# Patient Record
Sex: Female | Born: 1945
Health system: Southern US, Community
[De-identification: ages and names within clinical notes are randomized; demographics above are authoritative.]

## PROBLEM LIST (undated history)

## (undated) DIAGNOSIS — F419 Anxiety disorder, unspecified: Secondary | ICD-10-CM

## (undated) DIAGNOSIS — I824Z9 Acute embolism and thrombosis of unspecified deep veins of unspecified distal lower extremity: Secondary | ICD-10-CM

## (undated) DIAGNOSIS — Z9889 Other specified postprocedural states: Secondary | ICD-10-CM

## (undated) DIAGNOSIS — Z9289 Personal history of other medical treatment: Secondary | ICD-10-CM

## (undated) DIAGNOSIS — F329 Major depressive disorder, single episode, unspecified: Secondary | ICD-10-CM

## (undated) DIAGNOSIS — M8580 Other specified disorders of bone density and structure, unspecified site: Secondary | ICD-10-CM

## (undated) DIAGNOSIS — F32A Depression, unspecified: Secondary | ICD-10-CM

## (undated) DIAGNOSIS — Z7189 Other specified counseling: Secondary | ICD-10-CM

## (undated) DIAGNOSIS — R112 Nausea with vomiting, unspecified: Secondary | ICD-10-CM

## (undated) DIAGNOSIS — M1990 Unspecified osteoarthritis, unspecified site: Secondary | ICD-10-CM

## (undated) DIAGNOSIS — I10 Essential (primary) hypertension: Secondary | ICD-10-CM

## (undated) DIAGNOSIS — K5790 Diverticulosis of intestine, part unspecified, without perforation or abscess without bleeding: Secondary | ICD-10-CM

## (undated) DIAGNOSIS — D126 Benign neoplasm of colon, unspecified: Secondary | ICD-10-CM

## (undated) DIAGNOSIS — D591 Other autoimmune hemolytic anemias: Principal | ICD-10-CM

## (undated) HISTORY — PX: ABDOMINAL HYSTERECTOMY: SHX81

## (undated) HISTORY — DX: Depression, unspecified: F32.A

## (undated) HISTORY — DX: Major depressive disorder, single episode, unspecified: F32.9

## (undated) HISTORY — DX: Diverticulosis of intestine, part unspecified, without perforation or abscess without bleeding: K57.90

## (undated) HISTORY — DX: Other autoimmune hemolytic anemias: D59.1

## (undated) HISTORY — DX: Benign neoplasm of colon, unspecified: D12.6

## (undated) HISTORY — PX: KNEE ARTHROSCOPY: SHX127

## (undated) HISTORY — DX: Other specified disorders of bone density and structure, unspecified site: M85.80

## (undated) HISTORY — DX: Unspecified osteoarthritis, unspecified site: M19.90

## (undated) HISTORY — DX: Essential (primary) hypertension: I10

## (undated) HISTORY — DX: Acute embolism and thrombosis of unspecified deep veins of unspecified distal lower extremity: I82.4Z9

## (undated) HISTORY — DX: Anxiety disorder, unspecified: F41.9

---
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
## 1999-03-18 ENCOUNTER — Other Ambulatory Visit: Admission: RE | Admit: 1999-03-18 | Discharge: 1999-03-18 | Payer: Self-pay | Admitting: *Deleted

## 2000-10-04 ENCOUNTER — Other Ambulatory Visit: Admission: RE | Admit: 2000-10-04 | Discharge: 2000-10-04 | Payer: Self-pay | Admitting: *Deleted

## 2000-12-12 DIAGNOSIS — I824Z9 Acute embolism and thrombosis of unspecified deep veins of unspecified distal lower extremity: Secondary | ICD-10-CM

## 2000-12-12 HISTORY — DX: Acute embolism and thrombosis of unspecified deep veins of unspecified distal lower extremity: I82.4Z9

## 2001-10-02 ENCOUNTER — Other Ambulatory Visit: Admission: RE | Admit: 2001-10-02 | Discharge: 2001-10-02 | Payer: Self-pay | Admitting: *Deleted

## 2002-10-02 ENCOUNTER — Other Ambulatory Visit: Admission: RE | Admit: 2002-10-02 | Discharge: 2002-10-02 | Payer: Self-pay | Admitting: *Deleted

## 2004-02-09 ENCOUNTER — Other Ambulatory Visit: Admission: RE | Admit: 2004-02-09 | Discharge: 2004-02-09 | Payer: Self-pay | Admitting: *Deleted

## 2004-12-12 HISTORY — PX: REPLACEMENT TOTAL KNEE: SUR1224

## 2005-02-01 ENCOUNTER — Other Ambulatory Visit: Admission: RE | Admit: 2005-02-01 | Discharge: 2005-02-01 | Payer: Self-pay | Admitting: *Deleted

## 2005-06-30 ENCOUNTER — Ambulatory Visit: Payer: Self-pay | Admitting: Internal Medicine

## 2006-03-06 ENCOUNTER — Other Ambulatory Visit: Admission: RE | Admit: 2006-03-06 | Discharge: 2006-03-06 | Payer: Self-pay | Admitting: *Deleted

## 2006-07-11 ENCOUNTER — Ambulatory Visit: Payer: Self-pay | Admitting: Internal Medicine

## 2007-01-29 ENCOUNTER — Ambulatory Visit: Payer: Self-pay

## 2007-10-16 ENCOUNTER — Other Ambulatory Visit: Admission: RE | Admit: 2007-10-16 | Discharge: 2007-10-16 | Payer: Self-pay | Admitting: *Deleted

## 2007-11-22 ENCOUNTER — Ambulatory Visit: Payer: Self-pay | Admitting: Internal Medicine

## 2007-11-22 DIAGNOSIS — M2550 Pain in unspecified joint: Secondary | ICD-10-CM | POA: Insufficient documentation

## 2007-11-22 DIAGNOSIS — I10 Essential (primary) hypertension: Secondary | ICD-10-CM | POA: Insufficient documentation

## 2007-11-28 ENCOUNTER — Ambulatory Visit: Payer: Self-pay | Admitting: Internal Medicine

## 2007-11-28 ENCOUNTER — Inpatient Hospital Stay (HOSPITAL_COMMUNITY): Admission: RE | Admit: 2007-11-28 | Discharge: 2007-12-01 | Payer: Self-pay | Admitting: Orthopedic Surgery

## 2007-11-29 ENCOUNTER — Telehealth: Payer: Self-pay | Admitting: Internal Medicine

## 2007-12-13 DIAGNOSIS — Z9289 Personal history of other medical treatment: Secondary | ICD-10-CM

## 2007-12-13 HISTORY — DX: Personal history of other medical treatment: Z92.89

## 2008-12-23 DIAGNOSIS — D649 Anemia, unspecified: Secondary | ICD-10-CM | POA: Insufficient documentation

## 2008-12-23 DIAGNOSIS — F341 Dysthymic disorder: Secondary | ICD-10-CM | POA: Insufficient documentation

## 2008-12-24 ENCOUNTER — Ambulatory Visit: Payer: Self-pay | Admitting: Internal Medicine

## 2009-12-12 DIAGNOSIS — D126 Benign neoplasm of colon, unspecified: Secondary | ICD-10-CM

## 2009-12-12 HISTORY — PX: OTHER SURGICAL HISTORY: SHX169

## 2009-12-12 HISTORY — DX: Benign neoplasm of colon, unspecified: D12.6

## 2010-02-04 ENCOUNTER — Telehealth: Payer: Self-pay | Admitting: Internal Medicine

## 2010-05-06 ENCOUNTER — Telehealth: Payer: Self-pay | Admitting: Internal Medicine

## 2010-10-18 ENCOUNTER — Encounter (INDEPENDENT_AMBULATORY_CARE_PROVIDER_SITE_OTHER): Payer: Self-pay | Admitting: *Deleted

## 2010-11-02 ENCOUNTER — Encounter (INDEPENDENT_AMBULATORY_CARE_PROVIDER_SITE_OTHER): Payer: Self-pay | Admitting: *Deleted

## 2010-11-03 ENCOUNTER — Ambulatory Visit: Payer: Self-pay | Admitting: Internal Medicine

## 2010-11-08 ENCOUNTER — Ambulatory Visit: Payer: Self-pay | Admitting: Internal Medicine

## 2010-11-11 ENCOUNTER — Encounter: Payer: Self-pay | Admitting: Internal Medicine

## 2011-01-11 NOTE — Progress Notes (Signed)
Summary: Switch providers   Phone Note From Other Clinic   Caller: Malachi Bonds @ [facility]  [phone] Call For: Dr. Marina Goodell Summary of Call: pt. would like to switch from Dr. Marina Goodell to Dr. Juanda Chance b/c she prefers a female physician...needs to sch'd a colon Initial call taken by: Karna Christmas,  May 06, 2010 4:37 PM  Follow-up for Phone Call        I Understand. ok w/ me Follow-up by: Hilarie Fredrickson MD,  May 11, 2010 9:25 AM  Additional Follow-up for Phone Call Additional follow up Details #1::        OK.  Additional Follow-up by: Hart Carwin MD,  May 11, 2010 12:49 PM     Appended Document: Switch providers Malachi Bonds at [facility] ntfd. transfer approved.She will contact pt.

## 2011-01-11 NOTE — Procedures (Signed)
Summary: Colonoscopy  Patient: France Lusty Note: All result statuses are Final unless otherwise noted.  Tests: (1) Colonoscopy (COL)   COL Colonoscopy           DONE     [facility]     [address]     [place], [place]  [address]           COLONOSCOPY PROCEDURE REPORT           PATIENT:  Shonice, Wrisley  MR#:  [id number]     BIRTHDATE:  1946/10/27, 64 yrs. old  GENDER:  female     ENDOSCOPIST:  Hedwig Morton. Juanda Chance, MD     REF. BY:  Kari Baars, M.D.     PROCEDURE DATE:  11/08/2010     PROCEDURE:  Colonoscopy 19147     ASA CLASS:  Class I     INDICATIONS:  Routine Risk Screening     MEDICATIONS:   Versed 9 mg, Fentanyl 75 mcg, Benadryl 25 mg           DESCRIPTION OF PROCEDURE:   After the risks benefits and     alternatives of the procedure were thoroughly explained, informed     consent was obtained.  Digital rectal exam was performed and     revealed no rectal masses.   The LB PCF-H180AL [id number] endoscope     was introduced through the anus and advanced to the cecum, which     was identified by both the appendix and ileocecal valve, without     limitations.  The quality of the prep was good, using MoviPrep.     The instrument was then slowly withdrawn as the colon was fully     examined.     <<PROCEDUREIMAGES>>           FINDINGS:  Moderate diverticulosis was found in the sigmoid to     descending colon segments (see image1 and image6).  A diminutive     polyp was found. 2 mm polyp on the IC valve The polyp was removed     using cold biopsy forceps (see image4).  This was otherwise a     normal examination of the colon (see image2, image3, and image7).     Retroflexed views in the rectum revealed no abnormalities.    The     scope was then withdrawn from the patient and the procedure     completed.           COMPLICATIONS:  None     ENDOSCOPIC IMPRESSION:     1) Moderate diverticulosis in the sigmoid to descending colon     segments     2) Diminutive  polyp     3) Otherwise normal examination     RECOMMENDATIONS:     1) Await pathology results     2) High fiber diet.     REPEAT EXAM:  In 10 year(s) for.           ______________________________     Hedwig Morton. Juanda Chance, MD           CC:           n.     eSIGNED:   Hedwig Morton. Brodie at 11/08/2010 02:09 PM           Baus, Bowring, [phone]  Note: An exclamation mark (!) indicates a result that was not dispersed into the flowsheet. Document Creation Date: 11/08/2010 2:09 PM _______________________________________________________________________  (1) Order result  status: Final Collection or observation date-time: 11/08/2010 14:00 Requested date-time:  Receipt date-time:  Reported date-time:  Referring Physician:   Ordering Physician: Lina Sar [phone]) Specimen Source:  Source: Launa Grill Order Number: [id number] Lab site:   Appended Document: Colonoscopy recall colon 5 years  Appended Document: Colonoscopy     Procedures Next Due Date:    Colonoscopy: 11/2015

## 2011-01-11 NOTE — Miscellaneous (Signed)
Summary: DIR COL...AS.  Clinical Lists Changes  Medications: Added new medication of MOVIPREP 100 GM  SOLR (PEG-KCL-NACL-NASULF-NA ASC-C) As per prep instructions. - Signed Rx of MOVIPREP 100 GM  SOLR (PEG-KCL-NACL-NASULF-NA ASC-C) As per prep instructions.;  #1 x 0;  Signed;  Entered by: Clide Cliff RN;  Authorized by: Hart Carwin MD;  Method used: [facility] to [facility] [address] [phone]*, [address], [place], [place], [place]  [address], Ph: [phone] or [phone], Fax: [phone] Observations: Added new observation of ALLERGY REV: Done (11/03/2010 7:56)    Prescriptions: MOVIPREP 100 GM  SOLR (PEG-KCL-NACL-NASULF-NA ASC-C) As per prep instructions.  #1 x 0   Entered by:   Clide Cliff RN   Authorized by:   Hart Carwin MD   Signed by:   Clide Cliff RN on 11/03/2010   Method used:   [facility]        [facility] [address] [phone]* (retail)       [address]       [place], [place]  [address]       Ph: [phone] or [phone]       Fax: [phone]   RxID:   [id number]

## 2011-01-11 NOTE — Letter (Signed)
Summary: [facility] Instructions  [facility]  [address] [place], [place] [address]   Phone: [phone]  Fax: [phone]       Christine [name]    [birth year]    MRN: [id number]        Procedure Day /Date:  11/08/10  Monday     Arrival Time: 12:30pm     Procedure Time:  1:30pm     Location of Procedure:                    _x _  [facility] (4th Floor)                        PREPARATION FOR COLONOSCOPY WITH MOVIPREP   Starting 5 days prior to your procedure Saturday 11/23 do not eat nuts, seeds, popcorn, corn, beans, peas,  salads, or any raw vegetables.  Do not take any fiber supplements (e.g. Metamucil, Citrucel, and Benefiber).  THE DAY BEFORE YOUR PROCEDURE         DATE: Wednesday 11/07/10  1.  Drink clear liquids the entire day-NO SOLID FOOD  2.  Do not drink anything colored red or purple.  Avoid juices with pulp.  No orange juice.  3.  Drink at least 64 oz. (8 glasses) of fluid/clear liquids during the day to prevent dehydration and help the prep work efficiently.  CLEAR LIQUIDS INCLUDE: Water Jello Ice Popsicles Tea (sugar ok, no milk/cream) Powdered fruit flavored drinks Coffee (sugar ok, no milk/cream) Gatorade Juice: apple, white grape, white cranberry  Lemonade Clear bullion, consomm, broth Carbonated beverages (any kind) Strained chicken noodle soup Hard Candy                             4.  In the morning, mix first dose of MoviPrep solution:    Empty 1 Pouch A and 1 Pouch B into the disposable container    Add lukewarm drinking water to the top line of the container. Mix to dissolve    Refrigerate (mixed solution should be used within 24 hrs)  5.  Begin drinking the prep at 5:00 p.m. The MoviPrep container is divided by 4 marks.   Every 15 minutes drink the solution down to the next mark (approximately 8 oz) until the full liter is complete.   6.  Follow completed prep with 16 oz of clear liquid of your choice  (Nothing red or purple).  Continue to drink clear liquids until bedtime.  7.  Before going to bed, mix second dose of MoviPrep solution:    Empty 1 Pouch A and 1 Pouch B into the disposable container    Add lukewarm drinking water to the top line of the container. Mix to dissolve    Refrigerate  THE DAY OF YOUR PROCEDURE      DATE: 11/08/10 Monday  Beginning at 8:30 a.m. (5 hours before procedure):         1. Every 15 minutes, drink the solution down to the next mark (approx 8 oz) until the full liter is complete.  2. Follow completed prep with 16 oz. of clear liquid of your choice.    3. You may drink clear liquids until 11:30am (2 HOURS BEFORE PROCEDURE).   MEDICATION INSTRUCTIONS  Unless otherwise instructed, you should take regular prescription medications with a small sip of water   as early as possible the morning of your procedure.  Additional medication instructions: _HOLD b/p med am of procedure.         OTHER INSTRUCTIONS  You will need a responsible adult at least 66 years of age to accompany you and drive you home.   This person must remain in the waiting room during your procedure.  Wear loose fitting clothing that is easily removed.  Leave jewelry and other valuables at home.  However, you may wish to bring a book to read or  an iPod/MP3 player to listen to music as you wait for your procedure to start.  Remove all body piercing jewelry and leave at home.  Total time from sign-in until discharge is approximately 2-3 hours.  You should go home directly after your procedure and rest.  You can resume normal activities the  day after your procedure.  The day of your procedure you should not:   Drive   Make legal decisions   Operate machinery   Drink alcohol   Return to work  You will receive specific instructions about eating, activities and medications before you leave.    The above instructions have been reviewed and explained to me by    Clide Cliff, RN______________________    I fully understand and can verbalize these instructions _____________________________ Date _________

## 2011-01-11 NOTE — Letter (Signed)
Summary: Pre Visit Letter Revised  [facility]  [address] [place], [place] [address]   Phone: [phone]  Fax: [phone]        10/18/2010 MRN: [id number] [facility] [address]  [place], [place]  [address]             Procedure Date:  11/11/2010   Welcome to the Gastroenterology Division at [facility].    You are scheduled to see a nurse for your pre-procedure visit on 11/03/2010 at 8:00AM on the 3rd floor at [facility], [address]  We ask that you try to arrive at our office 15 minutes prior to your appointment time to allow for check-in.  Please take a minute to review the attached form.  If you answer "Yes" to one or more of the questions on the first page, we ask that you call the person listed at your earliest opportunity.  If you answer "No" to all of the questions, please complete the rest of the form and bring it to your appointment.    Your nurse visit will consist of discussing your medical and surgical history, your immediate family medical history, and your medications.   If you are unable to list all of your medications on the form, please bring the medication bottles to your appointment and we will list them.  We will need to be aware of both prescribed and over the counter drugs.  We will need to know exact dosage information as well.    Please be prepared to read and sign documents such as consent forms, a financial agreement, and acknowledgement forms.  If necessary, and with your consent, a friend or relative is welcome to sit-in on the nurse visit with you.  Please bring your insurance card so that we may make a copy of it.  If your insurance requires a referral to see a specialist, please bring your referral form from your primary care physician.  No co-pay is required for this nurse visit.     If you cannot keep your appointment, please call [phone] to cancel or reschedule prior to your appointment date.   This allows [place] the opportunity to schedule an appointment for another patient in need of care.    Thank you for choosing [facility] for your medical needs.  We appreciate the opportunity to care for you.  Please visit [place] at our website  to learn more about our practice.  Sincerely, The Gastroenterology Division

## 2011-01-11 NOTE — Letter (Signed)
Summary: Patient Notice- Polyp Results  [facility]  [address] [place], [place] [address]   Phone: [phone]  Fax: [phone]        November 11, 2010 MRN: [id number]    [facility] [address] [place], [place]  [address]    Dear [name],  I am pleased to inform you that the colon polyp(s) removed during your recent colonoscopy was (were) found to be benign (no cancer detected) upon pathologic examination.The polyp was adenomatous ( precancerous)  I recommend you have a repeat colonoscopy examination in 5_ years to look for recurrent polyps, as having colon polyps increases your risk for having recurrent polyps or even colon cancer in the future.  Should you develop new or worsening symptoms of abdominal pain, bowel habit changes or bleeding from the rectum or bowels, please schedule an evaluation with either your primary care physician or with me.  Additional information/recommendations:  _x_ No further action with gastroenterology is needed at this time. Please      follow-up with your primary care physician for your other healthcare      needs.  __ Please call [phone] to schedule a return visit to review your      situation.  __ Please keep your follow-up visit as already scheduled.  __ Continue treatment plan as outlined the day of your exam.  Please call us if you are having persistent problems or have questions about your condition that have not been fully answered at this time.  Sincerely,  Hart Carwin MD  This letter has been electronically signed by your physician.  Appended Document: Patient Notice- Polyp Results Letter mailed

## 2011-01-11 NOTE — Progress Notes (Signed)
Summary: Switch from Dr Marina Goodell to female physician   Phone Note From Other Clinic   Caller: [facility] @ Dr Clelia Croft Call For: Dr Marina Goodell Reason for Call: Schedule Patient Appt Summary of Call: Nothing against Dr Marina Goodell -he is wonderful but would feel more comfortable with a femae physician. Would like to switch to Dr Juanda Chance and have a Colonoscopy.  Initial call taken by: Leanor Kail [facility],  February 04, 2010 4:15 PM  Follow-up for Phone Call        ok w/ me. Follow-up by: Hilarie Fredrickson MD,  February 05, 2010 9:27 AM  Additional Follow-up for Phone Call Additional follow up Details #1::        ok with me. DB     Appended Document: Switch from Dr Marina Goodell to female physician   Pt's. last visit with DR.Marina Goodell was in Jan. 2010.Do you want to see her in the office first or is she to be scheduled for direct colon.

## 2011-04-26 NOTE — Op Note (Signed)
Christine [name], Christine [name]              ACCOUNT NO.:  [id number]   MEDICAL RECORD NO.:  [id number]          PATIENT TYPE:  INP   LOCATION:  0007                         FACILITY:  [facility]   PHYSICIAN:  Ollen Gross, M.D.    DATE OF BIRTH:  05-24-1946   DATE OF PROCEDURE:  11/28/2007  DATE OF DISCHARGE:                               OPERATIVE REPORT   PREOPERATIVE DIAGNOSIS:  Osteoarthritis, left knee.   POSTOPERATIVE DIAGNOSIS:  Osteoarthritis, left knee.   PROCEDURE:  Left total knee arthroplasty.   SURGEON:  Ollen Gross, M.D.   ASSISTANT:  Avel Peace PA-C   ANESTHESIA:  General with postop Marcaine pain pump.   ESTIMATED BLOOD LOSS:  Minimal.   DRAINS:  None.   TOURNIQUET TIME:  37 minutes at 300 mmHg.   COMPLICATIONS:  None.   CONDITION:  Stable to recovery.   CLINICAL NOTE:  Christine [name] is a 65 year old female with severe end-stage  osteoarthritis of the left knee with progressively worsening pain and  dysfunction.  She also has a large ganglion cyst what appears to be  coming off the proximal tibiofibular joint.  She has had progressively  worsening pain and dysfunction and has failed nonoperative management.  She presents now for total knee arthroplasty.   PROCEDURE IN DETAIL:  After successful initiation of general anesthetic  a tourniquet was placed on the left thigh and left lower extremity  prepped and draped in the usual sterile fashion.  Extremity was wrapped  in Esmarch, knee flexed, tourniquet inflated to 300 mmHg.  Midline  incision made with 10 blade through subcutaneous tissue to the level of  the extensor mechanism.  A fresh blade is used make a medial  parapatellar arthrotomy.  Soft tissue of the proximal medial tibia  subperiosteally elevated to the joint line with the knife into the  semimembranosus bursa with a Cobb elevator.  Soft tissue laterally is  elevated with attention being paid to avoid patellar tendon on tibial  tubercle.  I did note the cyst  out laterally.  I did not drain it yet at  this time.  The patella subluxed laterally, knee flexed 90 degrees, ACL  and PCL removed.  Drill was used to create a starting hole in the distal  femur and canal was thoroughly irrigated.  5 degrees left valgus  alignment guide is placed and referencing off the posterior condyles  rotations marked and the block pinned to remove 11 mm off the distal  femur.  I took 11 because of preop flexion contracture.  Distal femoral  resection is made with an oscillating saw.  Sizing blocks placed, size 3  is most appropriate in the mediolateral plane, but 4 in the AP plane.  We used the markings for the 4 holes and referenced off the epicondylar  axis.  I placed a size 3 cutting block and these holes with rotation  enough epicondylar axis.  The anterior-posterior chamfer cuts were then  made.   Tibia subluxed forward and menisci are removed.  Extramedullary tibial  alignment guide is placed referencing proximally at the medial aspect of  the tibial  tubercle and distally along the second metatarsal axis and  tibial crest.  Blocks pinned to remove approximately 4 mm off the more  deficient medial side.  Tibial resection is made with an oscillating  saw.  Osteophytic rim was removed around the tibia.  I was able to see  where the ganglion was coming from and in fact is was coming from a cyst  in the tibia.  I cleared out the cyst cavity and then made small  incision in the ganglion to effectively decompress the entire cyst.  The  size 3 the most appropriate tibial component.  The proximal tibia is  then prepared with the modular drill and keel punch for size 3.  Femoral  preparation is completed with the intercondylar cut.   Size 3 mobile bearing tibial trial, size 3 posterior stabilized femoral  trial and a 10-mm posterior stabilized rotating platform insert trial  are placed.  With a 10, full extensions achieved with excellent varus  and valgus balance  throughout full range of motion.  The patella was  then everted and thickness measured to be 22 mm.  Freehand resection  taken to 13 mm, 38 template is placed, lug holes were drilled, trial  patella was placed and it tracks normally.  Osteophytes removed off the  posterior femur with the trial in place.  All trials removed and the cut  bone surfaces are prepared with pulsatile lavage.  Cement was mixed and  once ready for implantation, the size 3 mobile bearing tibial tray size  3 posterior stabilized femur, 38 patella are cemented into place.  Patella was held with the clamp.  Trial 10-mm inserts placed, knee held  in full extension and all extruded cement removed.  When the cement  fully hardened then the wound is copiously irrigated with saline  solution.  I inspected the lateral aspect again to ensure absolutely no  remnants of the cyst.  Please note that the small cyst within the tibia  from which the ganglion was emanating was packed with cement, so as to  close off the cyst cavity.  This was about a 5.5 mm cyst overall.  I  then removed the trial insert and injected FloSeal into the posterior  capsule.  The permanent 10 mm posterior stabilized rotating platform  insert is placed into the tibial tray.  The FloSeal was placed in the  mediolateral gutters and suprapatellar area.  Moist sponge was placed  and tourniquet released with total time of 37 minutes.  The sponge is  held for two minutes, removed and then minimal bleeding is encountered  and is stopped with cautery.  I again irrigated and then closed the  arthrotomy with interrupted #1 PDS.  Flexion against gravity is 145  degrees.  Subcu is closed with interrupted 2-0 Vicryl and subcuticular  running 4-0 Monocryl.  Catheter for Marcaine pain pump is placed and the  pump is initiated.  Steri-Strips and bulky sterile dressing are applied  and she is awakened and transferred to recovery in stable condition.      Ollen Gross,  M.D.  Electronically Signed     FA/MEDQ  D:  11/28/2007  T:  11/29/2007  Job:  [id number]

## 2011-04-29 NOTE — Discharge Summary (Signed)
NAMECELESTINE, PRIM              ACCOUNT NO.:  [id number]   MEDICAL RECORD NO.:  [id number]          PATIENT TYPE:  INP   LOCATION:  1615                         FACILITY:  [facility]   PHYSICIAN:  Ollen Gross, M.D.    DATE OF BIRTH:  29-Aug-1946   DATE OF ADMISSION:  11/28/2007  DATE OF DISCHARGE:  12/01/2007                               DISCHARGE SUMMARY   ADMITTING DIAGNOSES:  1. Osteoarthritis, left knee.  2. Hypertension.  3. Postmenopausal.  4. History of blood clots   DISCHARGE DIAGNOSES:  1. Osteoarthritis, left knee, status post left total knee      arthroplasty.  2. Postop blood loss anemia.  3. Status post transfusion without sequelae.  4. Mild hyponatremia, improved.  5. Postoperative dysphagia, probable gastroesophageal reflux disease.  6. Hypertension.  7. Postmenopausal.  8. History of blood clots.   PROCEDURE:  November 28, 2007, left total knee.  Surgeon:  Dr. Lequita Halt.  Assistant:  Avel Peace, PA-C.  Anesthesia:  General.   CONSULTATIONS:  [facility] internal medicine.   BRIEF HISTORY:  [name] is a 65 year old female with a severe end-stage  arthritis of left knee and progressively worsening pain and dysfunction,  large ganglion cyst which appeared to be coming off the proximal tibia-  fibular joints.  She has had progressively worsening pain with  nonoperative management, now presents for total knee.   LABORATORY DATA:  Preop CBC showed hemoglobin low at 10.4, hematocrit  29.7, white cell count 4.6. Serial CBCs were followed.  Hemoglobin did  drop down to 7.6 postop, given 2 units of blood and back up to 9.3.  Last hemoglobin 8.4, hematocrit 23.9.  PT and PTT preop 12.6 and 23,  respectively.  INR 0.9.  Serial pro times followed.  Last PT 16.4,  INR  1.3. Chemistry panel on admission slightly elevated total bilirubin of  1.6.  Remaining chemistry panel within normal limits.  Serial BMETs were  followed.  Sodium did drop from 136 to 133 and  back up to 137. Cardiac  enzymes taken x3, all normal.  These were done on November 30, 2007.  First set: CK 97, CK-MB 1.4, troponin 0.03.  Second set:  CK 87, CK-MB  1.2, troponin 0.04. Third set:  CK 75, CK-MB 1.1, troponin 0.03.  Preop  UA negative.  Blood group type AB positive.   EKG November 22, 2007:  Poor R-wave progression, chronic finding,  confirmed Dr. Alwyn Ren Followup EKG November 30, 2007:  Normal sinus  rhythm, septal infarct, inferior, age undetermined, unconfirmed.   Two-view chest November 26, 2007:  No active cardiopulmonary disease.   Followup chest November 30, 2007:  Decreased aeration at left base  consistent with atelectasis.   HOSPITAL COURSE:  The patient was admitted to [facility],  tolerated procedure well, transferred to recovery room and then to the  orthopedic floor. Started on PCA and p.o. analgesic pain control  following surgery.  Given 24 hours postop IV antibiotic. Doing pretty  well on the morning of day #1.  Unfortunately, her hemoglobin was quite  low at 7.6, a little bit of  low sodium. Fluids were adjusted, and her  blood pressure was stable. Put her blood pressure medications on  parameters, started on Coumadin and Lovenox with history of clots and  DVTs in the past.  She was given 2 units of blood.  She tolerated the  blood well with hemoglobin going back up to 8.6 following day of  December 19.  Dressing changed. Incision looked good.   Unfortunately later that late morning on postop day #2, she started to  develop some chest pain discomfort. This was following eating.  It felt  like things were getting caught in her chest, had some chest pressure.  Due to the onset of her symptoms, we decided to call internal medicine.  She was seen by [facility] hospitalists for chest pain to rule out  dyspepsia versus cardiac. Cardiac enzymes were ordered. Three sets were  all normal. Proton pump inhibitor was added.  She had been given a GI   cocktail after the onset as it was more kind of a postprandial-type  chest pressure. GI cocktail did not help.  She did undergo workup as  above.   She was doing a little bit better by the morning of day #3.  Chest x-ray  showed some atelectasis at the base.  With a prior history of smoking,  felt to be more dysphasia, probable GERD.  She was placed on omeprazole  for 8 weeks.  She was recommended to follow up with gastroenterology in  8 weeks for possible endoscopy and colonoscopy.  She was also given iron  supplement for the anemia.  From therapy standpoint, she was doing well  with her knee, walking greater than 100 feet.  She was discharged home  later that day.   DISCHARGE/PLAN:  1. She was discharged home on December 01, 2007.  2. For Discharge diagnoses, please see above.  3. Discharge medications include Nu-Iron, Percocet, Robaxin, Coumadin,      Lovenox, omeprazole.  4. Followup:  Follow up with Dr. Lequita Halt on December 30 or January 2.      Call the office for an appointment.  She will follow up gastrology      in 8 weeks for colonoscopy and upper endoscopy to evaluate the      anemia and dysphasia, waiting until she goes off the Coumadin.  5. Activity:  Weightbearing as tolerated, total knee protocol left      leg.  6. Home health PT and home health nursing.  7. Low-sodium heart-healthy diet.   DISPOSITION:  Home.   CONDITION ON DISCHARGE:  Improving.      Alexzandrew L. Perkins, P.A.C.      Ollen Gross, M.D.  Electronically Signed    ALP/MEDQ  D:  01/15/2008  T:  01/15/2008  Job:  [id number]   cc:   Titus Dubin. Alwyn Ren, MD,FACP,FCCP  [phone] [place]  [place] [address]

## 2011-04-29 NOTE — H&P (Signed)
Christine [name], ARAKI              ACCOUNT NO.:  [id number]   MEDICAL RECORD NO.:  [id number]         PATIENT TYPE:  LINP   LOCATION:                               FACILITY:  [facility]   PHYSICIAN:  Ollen Gross, M.D.    DATE OF BIRTH:  26-Jan-1946   DATE OF ADMISSION:  11/28/2007  DATE OF DISCHARGE:                              HISTORY & PHYSICAL   CHIEF COMPLAINT:  Left knee pain.   HISTORY OF PRESENT ILLNESS:  The patient is 65 year old female who has  been well-known to Dr. Ollen Gross for ongoing left knee pain.  She  has end-stage arthritis of the left knee.  She has had ganglion cyst  removed from her proximal tib-fib joint.  She has known arthritis.  It  is felt that the cyst is coming from due to the significant arthritis in  her knee.  It has been progressively getting worse.  She has reached a  point where she would like to have something surgically done.  Risks and  benefits discussed.  The patient was subsequently admitted to the  hospital.  She has been seen by Dr. Alwyn Ren preoperatively and felt  cleared for surgery.   ALLERGIES:  NO KNOWN DRUG ALLERGIES.   CURRENT MEDICATIONS:  Diovan, Premarin, Prozac.   PAST MEDICAL HISTORY:  History of blood clots, postmenopausal,  hypertension.   PAST SURGICAL HISTORY:  Left knee scope, decompression of ganglion cyst,  and hysterectomy.   FAMILY HISTORY:  Father with a history of heart attack.   SOCIAL HISTORY:  Married, part-time Clinical biochemist.  Past smoking  history, two drinks of wine a day.  Three children.  Family will be  assisting with care after surgery.   REVIEW OF SYSTEMS:  GENERAL:  No fevers, chills or night sweats.  NEURO:  No seizure, syncope or paralysis.  RESPIRATORY:  No shortness breath,  productive cough or hemoptysis.  CARDIOVASCULAR:  No chest pain, angina,  orthopnea.  GI:  No nausea, diarrhea, constipation.  GU:  No dysuria,  hematuria or discharge.  MUSCULOSKELETAL:  Does have joint pain and  swelling with stiffness, with her left knee.   PHYSICAL EXAMINATION:  VITAL SIGNS: Pulse 68, respirations 12, blood  pressure 138/66.  GENERAL:  A 65 year old white female, well-nourished, well-developed,  petite frame, no acute distress.  She is alert and oriented,  cooperative, pleasant.  Good historian.  HEENT:  Normocephalic, atraumatic.  Pupils are round, reactive.  Oropharynx clear.  EOMs intact.  NECK:  Supple.  No bruits.  CHEST:  Clear anterior posterior chest walls.  No rhonchi, rales or  wheezing.  HEART:  Regular rate and rhythm.  No murmur.  ABDOMEN:  Soft, flat, nontender, bowel sounds present.  RECTAL, BREASTS, GENITALIA:  Not done, not pertinent to present illness.  EXTREMITIES:  Left knee:  Range of motion 5 to 130, marked crepitus,  large ganglion, lateral proximal tibia.   IMPRESSION:  Osteoarthritis, left knee.   PLAN:  The patient was admitted to [facility] to undergo a  left total knee replacement arthroplasty.  Surgery will be performed by  Dr. Homero Fellers Aluisio.      Alexzandrew L. Perkins, P.A.C.      Ollen Gross, M.D.  Electronically Signed    ALP/MEDQ  D:  12/16/2007  T:  12/16/2007  Job:  [id number]   cc:   Ollen Gross, M.D.  Fax: [phone]

## 2011-09-16 LAB — COMPREHENSIVE METABOLIC PANEL
Albumin: 3.9
BUN: 11
CO2: 27
Chloride: 102
GFR calc Af Amer: >60
Glucose, Bld: 107 — ABNORMAL HIGH
Potassium: 4.6
Total Protein: 6.5

## 2011-09-16 LAB — BASIC METABOLIC PANEL
BUN: 7
CO2: 28
CO2: 29
Calcium: 7.9 — ABNORMAL LOW
Creatinine, Ser: 0.63
GFR calc Af Amer: >60
GFR calc non Af Amer: >60
GFR calc non Af Amer: >60
Glucose, Bld: 118 — ABNORMAL HIGH
Glucose, Bld: 134 — ABNORMAL HIGH
Potassium: 3.9
Sodium: 137

## 2011-09-16 LAB — URINALYSIS, ROUTINE W REFLEX MICROSCOPIC
Bilirubin Urine: NEGATIVE
Ketones, ur: NEGATIVE
Urobilinogen, UA: 0.2
pH: 6.5

## 2011-09-16 LAB — CBC
Hemoglobin: 8.4 — ABNORMAL LOW
MCHC: 35
MCHC: 36.3 — ABNORMAL HIGH
MCV: 96.8
MCV: 96.9
MCV: 98.7
RBC: 2.06 — ABNORMAL LOW
RBC: 2.78 — ABNORMAL LOW
RBC: 3.01 — ABNORMAL LOW
RDW: 13.6
RDW: 14.7
WBC: 4.6
WBC: 7.4

## 2011-09-16 LAB — CARDIAC PANEL(CRET KIN+CKTOT+MB+TROPI)
CK, MB: 1.2
Relative Index: INVALID
Total CK: 75
Total CK: 87
Troponin I: 0.03
Troponin I: 0.03
Troponin I: 0.04

## 2011-09-16 LAB — PREPARE RBC (CROSSMATCH)

## 2011-09-16 LAB — TYPE AND SCREEN

## 2011-09-16 LAB — PROTIME-INR
INR: 0.9
INR: 1.1
INR: 1.1
Prothrombin Time: 13.9

## 2011-09-16 LAB — HEMOGLOBIN AND HEMATOCRIT, BLOOD
HCT: 25 — ABNORMAL LOW
Hemoglobin: 8.9 — ABNORMAL LOW

## 2011-09-16 LAB — ABO/RH: ABO/RH(D): AB POS

## 2013-05-14 ENCOUNTER — Ambulatory Visit (HOSPITAL_COMMUNITY)
Admission: RE | Admit: 2013-05-14 | Discharge: 2013-05-14 | Disposition: A | Discharge status: Home / Self Care | Payer: Medicare Other | Source: Ambulatory Visit | Attending: Internal Medicine | Admitting: Internal Medicine

## 2013-05-14 ENCOUNTER — Other Ambulatory Visit (HOSPITAL_COMMUNITY): Payer: Self-pay | Admitting: Internal Medicine

## 2013-05-14 DIAGNOSIS — K8020 Calculus of gallbladder without cholecystitis without obstruction: Secondary | ICD-10-CM | POA: Insufficient documentation

## 2013-05-14 DIAGNOSIS — R1013 Epigastric pain: Secondary | ICD-10-CM | POA: Insufficient documentation

## 2013-08-22 ENCOUNTER — Ambulatory Visit (INDEPENDENT_AMBULATORY_CARE_PROVIDER_SITE_OTHER): Payer: Medicare Other | Admitting: General Surgery

## 2013-09-02 ENCOUNTER — Encounter (INDEPENDENT_AMBULATORY_CARE_PROVIDER_SITE_OTHER): Payer: Self-pay | Admitting: General Surgery

## 2013-09-02 ENCOUNTER — Ambulatory Visit (INDEPENDENT_AMBULATORY_CARE_PROVIDER_SITE_OTHER): Payer: Medicare Other | Admitting: General Surgery

## 2013-09-02 VITALS — BP 128/80 | HR 74 | Temp 97.6°F | Resp 16 | Ht 65.5 in | Wt 136.6 lb

## 2013-09-02 DIAGNOSIS — K8020 Calculus of gallbladder without cholecystitis without obstruction: Secondary | ICD-10-CM | POA: Insufficient documentation

## 2013-09-02 NOTE — Patient Instructions (Signed)
[facility] LAPAROSCOPIC SURG[facility]: POST OP INSTRUCTIONS Always review your discharge instruction sheet given to you by the facility where your surgery was performed. IF YOU HAVE DISABILITY OR FAMILY LEAVE FORMS, YOU MUST BRING THEM TO THE OFFICE FOR PROCESSING.   DO NOT GIVE THEM TO YOUR DOCTOR.  1. A prescription for pain medication may be given to you upon discharge.  Take your pain medication as prescribed, if needed.  If narcotic pain medicine is not needed, then you may take acetaminophen (Tylenol) or ibuprofen (Advil) as needed. 2. Take your usually prescribed medications unless otherwise directed. 3. If you need a refill on your pain medication, please contact your pharmacy.  They will contact our office to request authorization. Prescriptions will not be filled after 5pm or on week-ends. 4. You should follow a light diet the first few days after arrival home, such as soup and crackers, etc.  Be sure to include lots of fluids daily. 5. Most patients will experience some swelling and bruising in the area of the incisions.  Ice packs will help.  Swelling and bruising can take several days to resolve.  6. It is common to experience some constipation if taking pain medication after surgery.  Increasing fluid intake and taking a stool softener (such as Colace) will usually help or prevent this problem from occurring.  A mild laxative (Milk of Magnesia or Miralax) should be taken according to package instructions if there are no bowel movements after 48 hours. 7. Unless discharge instructions indicate otherwise, you may remove your bandages 72 hours after surgery, and you may shower at that time.  You may have steri-strips (small skin tapes) in place directly over the incision.  These strips should be left on the skin for 14 days.  If your surgeon used skin glue on the incision, you may shower in 24 hours.  The glue will flake off over the next 2-3 weeks.  Any sutures or  staples will be removed at the office during your follow-up visit. 8. ACTIVITIES:  You may resume regular (light) daily activities beginning the next day-such as daily self-care, walking, climbing stairs-gradually increasing activities as tolerated.  You may have sexual intercourse when it is comfortable.  Refrain from any heavy lifting or straining-nothing over 10 pounds for 2 weeks.   a. You may drive when you are no longer taking prescription pain medication, you can comfortably wear a seatbelt, and you can safely maneuver your car and apply brakes. b. RETURN TO WORK:  __Desk work in 5-7 days.  Full duty in 2 weeks if you are pain-free.________________________________________________________ 9. You should see your doctor in the office for a follow-up appointment approximately 2-3 weeks after your surgery.  Make sure that you call for this appointment within a day or two after you arrive home to insure a convenient appointment time. 10. OTHER INSTRUCTIONS: __________________________________________________________________________________________________________________________ __________________________________________________________________________________________________________________________ WHEN TO CALL YOUR DOCTOR: 1. Fever over 101.0 2. Inability to urinate 3. Continued bleeding from incision. 4. Increased pain, redness, or drainage from the incision. 5. Increasing abdominal pain  The clinic staff is available to answer your questions during regular business hours.  Please don't hesitate to call and ask to speak to one of the nurses for clinical concerns.  If you have a medical emergency, go to the nearest emergency room or call 911.  A surgeon from [facility] is always on call at the hospital. [address], [address], [place], [place]  [address] ? [address] [address], [place], [place]   [id number] [phone] ? [phone] ? FAX [phone] Web site:  [email or web address]

## 2013-09-02 NOTE — Progress Notes (Signed)
Patient ID: Christine [name], female   DOB: 25-May-1946, 67 y.o.   MRN: [id number]  Chief Complaint  Patient presents with  . New Evaluation    eval epigastric abd pain    HPI Christine [name] is a 67 y.o. female.   HPIshe is referred by Dr. Eric Form for further evaluation and treatment of right upper quadrant pain and gallstones. She's been having fairly classic cases of biliary colic since June. The last one was over Labor Day and lasted 1-1/2 hours. This is associated with nausea. Also some chills. No fever.  Her mother had a cholecystectomy because of gallstones in the past. Ultrasound demonstrates the following-Gallbladder: Multiple small gallstones are present. The largest  is 1.8 mm. No Murphy's sign or wall thickening.  Common Bile Duct: 6 mm in caliber which is upper normal.   gallstones and normal common bile duct diameter.    Past Medical History  Diagnosis Date  . Arthritis   . Hypertension   . Deep venous thrombosis of calf     Past Surgical History  Procedure Laterality Date  . Joint replacement      History reviewed. No pertinent family history.  Social History History  Substance Use Topics  . Smoking status: Former Smoker    Quit date: 09/02/2009  . Smokeless tobacco: Never Used  . Alcohol Use: Yes    No Known Allergies  Current Outpatient Prescriptions  Medication Sig Dispense Refill  . buPROPion (WELLBUTRIN XL) 300 MG 24 hr tablet       . losartan-hydrochlorothiazide (HYZAAR) 50-12.5 MG per tablet        No current facility-administered medications for this visit.    Review of Systems Review of Systems  Constitutional: Negative for fever.  HENT: Negative.   Respiratory: Negative.   Cardiovascular: Negative.   Gastrointestinal: Positive for nausea and abdominal pain.  Genitourinary: Negative.   Musculoskeletal: Positive for arthralgias.  Hematological: Negative.     Blood pressure 128/80, pulse 74, temperature 97.6 F (36.4 C),  temperature source Temporal, resp. rate 16, height 5' 5.5" (1.664 m), weight 136 lb 9.6 oz (61.961 kg).  Physical Exam Physical Exam  Constitutional: She appears well-developed and well-nourished. No distress.  HENT:  Head: Normocephalic and atraumatic.  Eyes: EOM are normal. No scleral icterus.  Cardiovascular: Normal rate and regular rhythm.   Pulmonary/Chest: Effort normal and breath sounds normal.  Abdominal: Soft. She exhibits no distension and no mass. There is no tenderness.  Musculoskeletal: She exhibits no edema.  Neurological: She is alert.  Skin: Skin is warm and dry.  Psychiatric: She has a normal mood and affect. Her behavior is normal.    Data Reviewed Ultrasound report  Assessment    Symptomatic cholelithiasis.     Plan    I recommended a laparoscopic cholecystectomy and she is in agreement with this.  I have explained the procedure, risks, and aftercare of cholecystectomy.  Risks include but are not limited to bleeding, infection, wound problems, anesthesia, diarrhea, bile leak, injury to common bile duct/liver/intestine.  She seems to understand and agrees to proceed.         Tiyonna Sardinha J 09/02/2013, 1:56 PM

## 2013-09-06 ENCOUNTER — Encounter (HOSPITAL_COMMUNITY): Payer: Self-pay | Admitting: Pharmacy Technician

## 2013-09-09 DIAGNOSIS — Z7189 Other specified counseling: Secondary | ICD-10-CM | POA: Insufficient documentation

## 2013-09-10 ENCOUNTER — Encounter (INDEPENDENT_AMBULATORY_CARE_PROVIDER_SITE_OTHER): Payer: Self-pay

## 2013-09-16 ENCOUNTER — Other Ambulatory Visit (HOSPITAL_COMMUNITY): Payer: Self-pay | Admitting: General Surgery

## 2013-09-16 NOTE — Patient Instructions (Addendum)
20 Sydnie Sigmund Aguayo  09/16/2013   Your procedure is scheduled on: 09-20-2013  Report to [facility] at 1140 AM.  Call this number if you have problems the morning of surgery [phone]   Remember:   Do not eat food  :After Midnight.              Clear liquids midnight until 0700 am day of surgery, then nothing by mouth.                  Take these medicines the morning of surgery with A SIP OF WATER: pt does not wish to take any meds                                SEE [place] PREPARING FOR SURGERY SHEET             You may not have any metal on your body including hair pins and piercings  Do not wear jewelry, make-up.  Do not wear lotions, powders, or perfumes. You may wear deodorant.   Men may shave face and neck.  Do not bring valuables to the hospital. [place] IS NOT RESPONSIBLE FOR VALUEABLES.  Contacts, dentures or bridgework may not be worn into surgery.  Leave suitcase in the car. After surgery it may be brought to your room.  For patients admitted to the hospital, checkout time is 11:00 AM the day of discharge.   Patients discharged the day of surgery will not be allowed to drive home.  Name and phone number of your driver:bill spouse cell [phone]  Special Instructions: N/A   Please read over the following fact sheets that you were given:   Call Cain Sieve RN pre op nurse if needed [phone]    FAILURE TO FOLLOW THESE INSTRUCTIONS MAY RESULT IN THE CANCELLATION OF YOUR SURGERY.  PATIENT SIGNATURE___________________________________________  NURSE SIGNATURE_____________________________________________

## 2013-09-17 ENCOUNTER — Encounter (HOSPITAL_COMMUNITY): Payer: Self-pay

## 2013-09-17 ENCOUNTER — Ambulatory Visit (HOSPITAL_COMMUNITY)
Admission: RE | Admit: 2013-09-17 | Discharge: 2013-09-17 | Disposition: A | Discharge status: Home / Self Care | Payer: Medicare Other | Source: Ambulatory Visit | Attending: General Surgery | Admitting: General Surgery

## 2013-09-17 ENCOUNTER — Encounter (HOSPITAL_COMMUNITY)
Admission: RE | Admit: 2013-09-17 | Discharge: 2013-09-17 | Disposition: A | Discharge status: Home / Self Care | Payer: Medicare Other | Source: Ambulatory Visit | Attending: General Surgery | Admitting: General Surgery

## 2013-09-17 DIAGNOSIS — K8020 Calculus of gallbladder without cholecystitis without obstruction: Secondary | ICD-10-CM | POA: Insufficient documentation

## 2013-09-17 DIAGNOSIS — R9431 Abnormal electrocardiogram [ECG] [EKG]: Secondary | ICD-10-CM | POA: Insufficient documentation

## 2013-09-17 DIAGNOSIS — Z01812 Encounter for preprocedural laboratory examination: Secondary | ICD-10-CM | POA: Insufficient documentation

## 2013-09-17 DIAGNOSIS — J984 Other disorders of lung: Secondary | ICD-10-CM | POA: Insufficient documentation

## 2013-09-17 HISTORY — DX: Personal history of other medical treatment: Z92.89

## 2013-09-17 HISTORY — DX: Other specified postprocedural states: R11.2

## 2013-09-17 HISTORY — DX: Other specified postprocedural states: Z98.890

## 2013-09-17 LAB — COMPREHENSIVE METABOLIC PANEL
AST: 27 U/L (ref 0–37)
BUN: 12 mg/dL (ref 6–23)
CO2: 26 mEq/L (ref 19–32)
Chloride: 94 mEq/L — ABNORMAL LOW (ref 96–112)
Creatinine, Ser: 0.68 mg/dL (ref 0.50–1.10)
GFR calc Af Amer: >90 mL/min (ref >90)
GFR calc non Af Amer: 88 mL/min — ABNORMAL LOW (ref >90)
Glucose, Bld: 85 mg/dL (ref 70–99)
Total Bilirubin: 1.9 mg/dL — ABNORMAL HIGH (ref 0.3–1.2)

## 2013-09-17 LAB — CBC WITH DIFFERENTIAL/PLATELET
Basophils Absolute: 0 10*3/uL (ref 0.0–0.1)
HCT: 29.2 % — ABNORMAL LOW (ref 36.0–46.0)
Hemoglobin: 10.4 g/dL — ABNORMAL LOW (ref 12.0–15.0)
Lymphocytes Relative: 21 % (ref 12–46)
Lymphs Abs: 1.6 10*3/uL (ref 0.7–4.0)
MCV: 94.2 fL (ref 78.0–100.0)
Monocytes Absolute: 0.5 10*3/uL (ref 0.1–1.0)
Monocytes Relative: 6 % (ref 3–12)
Neutro Abs: 5.7 10*3/uL (ref 1.7–7.7)
RBC: 3.1 MIL/uL — ABNORMAL LOW (ref 3.87–5.11)
WBC: 7.9 10*3/uL (ref 4.0–10.5)

## 2013-09-17 LAB — PROTIME-INR: INR: 0.99 (ref 0.00–1.49)

## 2013-09-17 NOTE — Progress Notes (Signed)
cmet results routed to dr Abbey Chatters inbasket by epic

## 2013-09-20 ENCOUNTER — Encounter (HOSPITAL_COMMUNITY): Payer: Medicare Other | Admitting: Anesthesiology

## 2013-09-20 ENCOUNTER — Encounter (HOSPITAL_COMMUNITY): Payer: Self-pay | Admitting: *Deleted

## 2013-09-20 ENCOUNTER — Ambulatory Visit (HOSPITAL_COMMUNITY): Payer: Medicare Other | Admitting: Anesthesiology

## 2013-09-20 ENCOUNTER — Ambulatory Visit (HOSPITAL_COMMUNITY): Payer: Medicare Other

## 2013-09-20 ENCOUNTER — Ambulatory Visit (HOSPITAL_COMMUNITY)
Admission: RE | Admit: 2013-09-20 | Discharge: 2013-09-20 | Disposition: A | Discharge status: Home / Self Care | Payer: Medicare Other | Source: Ambulatory Visit | Attending: General Surgery | Admitting: General Surgery

## 2013-09-20 ENCOUNTER — Encounter (HOSPITAL_COMMUNITY)
Admission: RE | Disposition: A | Discharge status: Home / Self Care | Payer: Self-pay | Source: Ambulatory Visit | Attending: General Surgery

## 2013-09-20 DIAGNOSIS — Z86718 Personal history of other venous thrombosis and embolism: Secondary | ICD-10-CM | POA: Insufficient documentation

## 2013-09-20 DIAGNOSIS — Z79899 Other long term (current) drug therapy: Secondary | ICD-10-CM | POA: Insufficient documentation

## 2013-09-20 DIAGNOSIS — K8020 Calculus of gallbladder without cholecystitis without obstruction: Secondary | ICD-10-CM | POA: Insufficient documentation

## 2013-09-20 DIAGNOSIS — K811 Chronic cholecystitis: Secondary | ICD-10-CM

## 2013-09-20 DIAGNOSIS — I10 Essential (primary) hypertension: Secondary | ICD-10-CM | POA: Insufficient documentation

## 2013-09-20 HISTORY — PX: CHOLECYSTECTOMY: SHX55

## 2013-09-20 SURGERY — LAPAROSCOPIC CHOLECYSTECTOMY WITH INTRAOPERATIVE CHOLANGIOGRAM
Anesthesia: General | Site: Abdomen | Wound class: Clean Contaminated

## 2013-09-20 MED ORDER — LACTATED RINGERS IR SOLN
Status: DC | PRN
  Administered 2013-09-20: 1000 mL

## 2013-09-20 MED ORDER — ROCURONIUM BROMIDE 100 MG/10ML IV SOLN
INTRAVENOUS | Status: DC | PRN
  Administered 2013-09-20: 40 mg via INTRAVENOUS

## 2013-09-20 MED ORDER — MIDAZOLAM HCL 5 MG/5ML IJ SOLN
INTRAMUSCULAR | Status: DC | PRN
  Administered 2013-09-20 (×2): 1 mg via INTRAVENOUS

## 2013-09-20 MED ORDER — BUPIVACAINE HCL (PF) 0.5 % IJ SOLN
INTRAMUSCULAR | Status: AC
  Filled 2013-09-20: qty 30

## 2013-09-20 MED ORDER — LACTATED RINGERS IV SOLN
INTRAVENOUS | Status: DC | PRN
  Administered 2013-09-20: 13:00:00 via INTRAVENOUS

## 2013-09-20 MED ORDER — 0.9 % SODIUM CHLORIDE (POUR BTL) OPTIME
TOPICAL | Status: DC | PRN
  Administered 2013-09-20: 1000 mL

## 2013-09-20 MED ORDER — NEOSTIGMINE METHYLSULFATE 1 MG/ML IJ SOLN
INTRAMUSCULAR | Status: DC | PRN
  Administered 2013-09-20: 4 mg via INTRAVENOUS

## 2013-09-20 MED ORDER — CEFAZOLIN SODIUM-DEXTROSE 2-3 GM-% IV SOLR
INTRAVENOUS | Status: AC
  Filled 2013-09-20: qty 50

## 2013-09-20 MED ORDER — HYDROMORPHONE HCL PF 1 MG/ML IJ SOLN
0.2500 mg | INTRAMUSCULAR | Status: DC | PRN
  Administered 2013-09-20 (×3): 0.5 mg via INTRAVENOUS

## 2013-09-20 MED ORDER — METOCLOPRAMIDE HCL 5 MG/ML IJ SOLN
INTRAMUSCULAR | Status: DC | PRN
Start: 2013-09-20 — End: 2013-09-20
  Administered 2013-09-20: 10 mg via INTRAVENOUS

## 2013-09-20 MED ORDER — PROMETHAZINE HCL 25 MG/ML IJ SOLN
INTRAMUSCULAR | Status: AC
  Filled 2013-09-20: qty 1

## 2013-09-20 MED ORDER — LACTATED RINGERS IV SOLN
INTRAVENOUS | Status: DC
  Administered 2013-09-20: 1000 mL via INTRAVENOUS

## 2013-09-20 MED ORDER — HYDROMORPHONE HCL PF 1 MG/ML IJ SOLN
INTRAMUSCULAR | Status: AC
  Filled 2013-09-20: qty 1

## 2013-09-20 MED ORDER — LIDOCAINE HCL (CARDIAC) 20 MG/ML IV SOLN
INTRAVENOUS | Status: DC | PRN
  Administered 2013-09-20: 50 mg via INTRAVENOUS

## 2013-09-20 MED ORDER — DEXAMETHASONE SODIUM PHOSPHATE 10 MG/ML IJ SOLN
INTRAMUSCULAR | Status: DC | PRN
  Administered 2013-09-20: 5 mg via INTRAVENOUS

## 2013-09-20 MED ORDER — ONDANSETRON HCL 4 MG/2ML IJ SOLN
INTRAMUSCULAR | Status: DC | PRN
  Administered 2013-09-20: 4 mg via INTRAMUSCULAR

## 2013-09-20 MED ORDER — GLYCOPYRROLATE 0.2 MG/ML IJ SOLN
INTRAMUSCULAR | Status: DC | PRN
  Administered 2013-09-20: .6 mg via INTRAVENOUS

## 2013-09-20 MED ORDER — PROMETHAZINE HCL 25 MG/ML IJ SOLN
6.2500 mg | INTRAMUSCULAR | Status: DC | PRN
  Administered 2013-09-20: 6.25 mg via INTRAVENOUS

## 2013-09-20 MED ORDER — OXYCODONE HCL 5 MG PO TABS: 5.0000 mg | ORAL_TABLET | ORAL | Status: DC | PRN

## 2013-09-20 MED ORDER — CEFAZOLIN SODIUM-DEXTROSE 2-3 GM-% IV SOLR
2.0000 g | INTRAVENOUS | Status: AC
  Administered 2013-09-20: 2 g via INTRAVENOUS

## 2013-09-20 MED ORDER — PROPOFOL 10 MG/ML IV BOLUS
INTRAVENOUS | Status: DC | PRN
  Administered 2013-09-20: 170 mg via INTRAVENOUS

## 2013-09-20 MED ORDER — IOHEXOL 300 MG/ML  SOLN
INTRAMUSCULAR | Status: DC | PRN
  Administered 2013-09-20: 8 mL via INTRAVENOUS

## 2013-09-20 MED ORDER — FENTANYL CITRATE 0.05 MG/ML IJ SOLN
INTRAMUSCULAR | Status: DC | PRN
  Administered 2013-09-20 (×5): 50 ug via INTRAVENOUS

## 2013-09-20 MED ORDER — BUPIVACAINE HCL 0.5 % IJ SOLN
INTRAMUSCULAR | Status: DC | PRN
  Administered 2013-09-20: 15 mL

## 2013-09-20 MED ORDER — ROCURONIUM BROMIDE 100 MG/10ML IV SOLN: INTRAVENOUS | Status: DC | PRN

## 2013-09-20 MED ORDER — HEPARIN SODIUM (PORCINE) 5000 UNIT/ML IJ SOLN
5000.0000 [IU] | Freq: Once | INTRAMUSCULAR | Status: AC
  Administered 2013-09-20: 5000 [IU] via SUBCUTANEOUS
  Filled 2013-09-20: qty 1

## 2013-09-20 SURGICAL SUPPLY — 38 items
APPLIER CLIP 5 13 M/L LIGAMAX5 (MISCELLANEOUS) ×2
BENZOIN TINCTURE PRP APPL 2/3 (GAUZE/BANDAGES/DRESSINGS) ×2 IMPLANT
CANISTER SUCTION 2500CC (MISCELLANEOUS) ×2 IMPLANT
CHLORAPREP W/TINT 26ML (MISCELLANEOUS) ×2 IMPLANT
CLIP APPLIE 5 13 M/L LIGAMAX5 (MISCELLANEOUS) ×1 IMPLANT
CLOTH BEACON ORANGE TIMEOUT ST (SAFETY) ×2 IMPLANT
COVER MAYO STAND STRL (DRAPES) ×2 IMPLANT
DECANTER SPIKE VIAL GLASS SM (MISCELLANEOUS) ×2 IMPLANT
DRAPE C-ARM 42X120 X-RAY (DRAPES) ×2 IMPLANT
DRAPE LAPAROSCOPIC ABDOMINAL (DRAPES) ×2 IMPLANT
DRAPE UTILITY XL STRL (DRAPES) ×2 IMPLANT
DRSG TEGADERM 2-3/8X2-3/4 SM (GAUZE/BANDAGES/DRESSINGS) ×6 IMPLANT
ELECT REM PT RETURN 9FT ADLT (ELECTROSURGICAL) ×2
ELECTRODE REM PT RTRN 9FT ADLT (ELECTROSURGICAL) ×1 IMPLANT
ENDOLOOP SUT PDS II  0 18 (SUTURE)
ENDOLOOP SUT PDS II 0 18 (SUTURE) IMPLANT
GAUZE SPONGE 2X2 8PLY STRL LF (GAUZE/BANDAGES/DRESSINGS) ×1 IMPLANT
GLOVE ECLIPSE 8.0 STRL XLNG CF (GLOVE) ×2 IMPLANT
GLOVE INDICATOR 8.0 STRL GRN (GLOVE) ×2 IMPLANT
GOWN STRL REIN XL XLG (GOWN DISPOSABLE) ×6 IMPLANT
HEMOSTAT SNOW SURGICEL 2X4 (HEMOSTASIS) IMPLANT
KIT BASIN OR (CUSTOM PROCEDURE TRAY) ×2 IMPLANT
NS IRRIG 1000ML POUR BTL (IV SOLUTION) IMPLANT
POUCH SPECIMEN RETRIEVAL 10MM (ENDOMECHANICALS) ×2 IMPLANT
SET CHOLANGIOGRAPH MIX (MISCELLANEOUS) ×2 IMPLANT
SET IRRIG TUBING LAPAROSCOPIC (IRRIGATION / IRRIGATOR) ×2 IMPLANT
SLEEVE XCEL OPT CAN 5 100 (ENDOMECHANICALS) ×6 IMPLANT
SOLUTION ANTI FOG 6CC (MISCELLANEOUS) ×2 IMPLANT
SPONGE GAUZE 2X2 STER 10/PKG (GAUZE/BANDAGES/DRESSINGS) ×1
STRIP CLOSURE SKIN 1/2X4 (GAUZE/BANDAGES/DRESSINGS) ×2 IMPLANT
SUT MNCRL AB 4-0 PS2 18 (SUTURE) ×2 IMPLANT
TOWEL OR 17X26 10 PK STRL BLUE (TOWEL DISPOSABLE) ×2 IMPLANT
TOWEL OR NON WOVEN STRL DISP B (DISPOSABLE) ×2 IMPLANT
TRAY LAP CHOLE (CUSTOM PROCEDURE TRAY) ×2 IMPLANT
TROCAR BLADELESS OPT 5 100 (ENDOMECHANICALS) ×2 IMPLANT
TROCAR XCEL BLUNT TIP 100MML (ENDOMECHANICALS) ×2 IMPLANT
TROCAR XCEL NON-BLD 11X100MML (ENDOMECHANICALS) IMPLANT
TUBING INSUFFLATION 10FT LAP (TUBING) ×2 IMPLANT

## 2013-09-20 NOTE — Interval H&P Note (Signed)
History and Physical Interval Note:  09/20/2013 12:58 PM  Christine [name]  has presented today for surgery, with the diagnosis of cholelithiasis   The various methods of treatment have been discussed with the patient and family. After consideration of risks, benefits and other options for treatment, the patient has consented to  Procedure(s): LAPAROSCOPIC CHOLECYSTECTOMY WITH INTRAOPERATIVE CHOLANGIOGRAM (N/A) as a surgical intervention .  The patient's history has been reviewed, patient examined, no change in status, stable for surgery.  I have reviewed the patient's chart and labs.  Questions were answered to the patient's satisfaction.     Meagan Ancona Shela Commons

## 2013-09-20 NOTE — H&P (View-Only) (Signed)
Patient ID: Christine [name], female   DOB: Feb 11, 1946, 67 y.o.   MRN: [id number]  Chief Complaint  Patient presents with  . New Evaluation    eval epigastric abd pain    HPI Christine [name] is a 67 y.o. female.   HPIshe is referred by Dr. Eric Form for further evaluation and treatment of right upper quadrant pain and gallstones. She's been having fairly classic cases of biliary colic since June. The last one was over Labor Day and lasted 1-1/2 hours. This is associated with nausea. Also some chills. No fever.  Her mother had a cholecystectomy because of gallstones in the past. Ultrasound demonstrates the following-Gallbladder: Multiple small gallstones are present. The largest  is 1.8 mm. No Murphy's sign or wall thickening.  Common Bile Duct: 6 mm in caliber which is upper normal.   gallstones and normal common bile duct diameter.    Past Medical History  Diagnosis Date  . Arthritis   . Hypertension   . Deep venous thrombosis of calf     Past Surgical History  Procedure Laterality Date  . Joint replacement      History reviewed. No pertinent family history.  Social History History  Substance Use Topics  . Smoking status: Former Smoker    Quit date: 09/02/2009  . Smokeless tobacco: Never Used  . Alcohol Use: Yes    No Known Allergies  Current Outpatient Prescriptions  Medication Sig Dispense Refill  . buPROPion (WELLBUTRIN XL) 300 MG 24 hr tablet       . losartan-hydrochlorothiazide (HYZAAR) 50-12.5 MG per tablet        No current facility-administered medications for this visit.    Review of Systems Review of Systems  Constitutional: Negative for fever.  HENT: Negative.   Respiratory: Negative.   Cardiovascular: Negative.   Gastrointestinal: Positive for nausea and abdominal pain.  Genitourinary: Negative.   Musculoskeletal: Positive for arthralgias.  Hematological: Negative.     Blood pressure 128/80, pulse 74, temperature 97.6 F (36.4 C),  temperature source Temporal, resp. rate 16, height 5' 5.5" (1.664 m), weight 136 lb 9.6 oz (61.961 kg).  Physical Exam Physical Exam  Constitutional: She appears well-developed and well-nourished. No distress.  HENT:  Head: Normocephalic and atraumatic.  Eyes: EOM are normal. No scleral icterus.  Cardiovascular: Normal rate and regular rhythm.   Pulmonary/Chest: Effort normal and breath sounds normal.  Abdominal: Soft. She exhibits no distension and no mass. There is no tenderness.  Musculoskeletal: She exhibits no edema.  Neurological: She is alert.  Skin: Skin is warm and dry.  Psychiatric: She has a normal mood and affect. Her behavior is normal.    Data Reviewed Ultrasound report  Assessment    Symptomatic cholelithiasis.     Plan    I recommended a laparoscopic cholecystectomy and she is in agreement with this.  I have explained the procedure, risks, and aftercare of cholecystectomy.  Risks include but are not limited to bleeding, infection, wound problems, anesthesia, diarrhea, bile leak, injury to common bile duct/liver/intestine.  She seems to understand and agrees to proceed.         Tyneshia Stivers J 09/02/2013, 1:56 PM

## 2013-09-20 NOTE — Transfer of Care (Signed)
Immediate Anesthesia Transfer of Care Note  Patient: Christine [name]  Procedure(s) Performed: Procedure(s): LAPAROSCOPIC CHOLECYSTECTOMY WITH INTRAOPERATIVE CHOLANGIOGRAM (N/A)  Patient Location: PACU  Anesthesia Type:General  Level of Consciousness: awake, alert , oriented and patient cooperative  Airway & Oxygen Therapy: Patient Spontanous Breathing and Patient connected to face mask oxygen  Post-op Assessment: Report given to PACU RN, Post -op Vital signs reviewed and stable and Patient moving all extremities  Post vital signs: Reviewed and stable  Complications: No apparent anesthesia complications

## 2013-09-20 NOTE — Anesthesia Preprocedure Evaluation (Addendum)
Anesthesia Evaluation  Patient identified by MRN, date of birth, ID band Patient awake    Reviewed: Allergy & Precautions, H&P , NPO status , Patient's Chart, lab work & pertinent test results  History of Anesthesia Complications (+) PONV  Airway Mallampati: II TM Distance: >3 FB Neck ROM: full    Dental  (+) Caps and Dental Advisory Given Upper front teeth are capped:   Pulmonary neg pulmonary ROS,  breath sounds clear to auscultation  Pulmonary exam normal       Cardiovascular Exercise Tolerance: Good hypertension, Pt. on medications Rhythm:regular Rate:Normal  History DVT   Neuro/Psych negative neurological ROS  negative psych ROS   GI/Hepatic negative GI ROS, Neg liver ROS,   Endo/Other  negative endocrine ROS  Renal/GU negative Renal ROS  negative genitourinary   Musculoskeletal   Abdominal   Peds  Hematology negative hematology ROS (+) Blood dyscrasia, anemia , hgb 10.4   Anesthesia Other Findings   Reproductive/Obstetrics negative OB ROS                          Anesthesia Physical Anesthesia Plan  ASA: II  Anesthesia Plan: General   Post-op Pain Management:    Induction: Intravenous  Airway Management Planned: Oral ETT  Additional Equipment:   Intra-op Plan:   Post-operative Plan: Extubation in OR  Informed Consent: I have reviewed the patients History and Physical, chart, labs and discussed the procedure including the risks, benefits and alternatives for the proposed anesthesia with the patient or authorized representative who has indicated his/her understanding and acceptance.   Dental Advisory Given  Plan Discussed with: CRNA and Surgeon  Anesthesia Plan Comments:         Anesthesia Quick Evaluation

## 2013-09-20 NOTE — Op Note (Signed)
Preoperative diagnosis:  Symptomatic cholelithiasis  Postoperative diagnosis:  Same  Procedure: Laparoscopic cholecystectomy with cholangiogram.  Surgeon: Avel Peace, M.D.  Asst.:  Gaynelle Adu, M.D.  Anesthesia: General  Indication:   This is a 67 year old female with post-prandial RUQ pain and gallstones.  She now presents for the above procedure.  Technique: She was brought to the operating room, placed supine on the operating table, and a general anesthetic was administered.  The abdominal wall was then sterilely prepped and draped. Local anesthetic (Marcaine) was infiltrated in the subumbilical region. A small subumbilical incision was made through the skin, subcutaneous tissue, fascia, and peritoneum entering the peritoneal cavity under direct vision. A pursestring suture of 0 Vicryl was placed around the edges of the fascia. A Hassan trocar was introduced into the peritoneal cavity and a pneumoperitoneum was created by insufflation of carbon dioxide gas. The laparoscope was introduced into the trocar and no underlying bleeding or organ injury was noted. The patient was then placed in the reverse Trendelenburg position with the right side tilted slightly up.  Three 5 mm trocars were then placed into the abdominal cavity under laparoscopic vision. One in the epigastric area, and 2 in the right upper quadrant area.  The liver appeared grossly normal.  The gallbladder was visualized and the fundus was grasped and retracted toward the right shoulder.  The infundibulum was mobilized with dissection close to the gallbladder and retracted laterally. The cystic duct was identified and a window was created around it. The cystic artery was also identified and a window was created around it. The critical view was achieved. A clip was placed at the neck of the gallbladder. A small incision was made in the cystic duct. A cholangiocatheter was introduced through the anterior abdominal wall and placed in  the cystic duct. A intraoperative cholangiogram was then performed.  Under real-time fluoroscopy, dilute contrast was injected into the cystic duct.  The common hepatic duct, the right and left hepatic ducts, and the common duct were all visualized. Contrast drained into the duodenum without obvious evidence of any obstructing ductal lesion. The final report is pending the Radiologist's interpretation.  The cholangiocatheter was removed, the cystic duct was clipped 3 times on the biliary side, and then the cystic duct was divided sharply. No bile leak was noted from the cystic duct stump.  The cystic artery was then clipped and divided. Following this the gallbladder was dissected free from the liver using electrocautery. The gallbladder was then placed in a retrieval bag and removed from the abdominal cavity through the subumbilical incision.  The gallbladder fossa was inspected, irrigated, and bleeding was controlled with electrocautery. Inspection showed that hemostasis was adequate and there was no evidence of bile leak.  The irrigation fluid was evacuated as much as possible.  The subumbilical trocar was removed and the fascial defect was closed by tightening and tying down the pursestring suture under laparoscopic vision.  The remaining trochars were removed and the pneumoperitoneum was released. The skin incisions were closed with 4-0 Monocryl subcuticular stitches. Steri-Strips and sterile dressings were applied.  The procedure was well-tolerated without any apparent complications. The patient was taken to the recovery room in satisfactory condition.

## 2013-09-23 ENCOUNTER — Encounter (HOSPITAL_COMMUNITY): Payer: Self-pay | Admitting: General Surgery

## 2013-09-23 NOTE — Anesthesia Postprocedure Evaluation (Signed)
  Anesthesia Post-op Note  Patient: Christine [name]  Procedure(s) Performed: Procedure(s) (LRB): LAPAROSCOPIC CHOLECYSTECTOMY WITH INTRAOPERATIVE CHOLANGIOGRAM (N/A)  Patient Location: PACU  Anesthesia Type: General  Level of Consciousness: awake and alert   Airway and Oxygen Therapy: Patient Spontanous Breathing  Post-op Pain: mild  Post-op Assessment: Post-op Vital signs reviewed, Patient's Cardiovascular Status Stable, Respiratory Function Stable, Patent Airway and No signs of Nausea or vomiting  Last Vitals:  Filed Vitals:   09/20/13 1542  BP: 140/78  Pulse: 60  Temp: 36.7 C  Resp: 12    Post-op Vital Signs: stable   Complications: No apparent anesthesia complications

## 2013-10-04 ENCOUNTER — Encounter (INDEPENDENT_AMBULATORY_CARE_PROVIDER_SITE_OTHER): Payer: Self-pay | Admitting: General Surgery

## 2013-10-04 ENCOUNTER — Ambulatory Visit (INDEPENDENT_AMBULATORY_CARE_PROVIDER_SITE_OTHER): Payer: Medicare Other | Admitting: General Surgery

## 2013-10-04 VITALS — BP 110/68 | HR 64 | Temp 97.9°F | Resp 14 | Ht 66.0 in | Wt 133.2 lb

## 2013-10-04 DIAGNOSIS — Z4889 Encounter for other specified surgical aftercare: Secondary | ICD-10-CM

## 2013-10-04 NOTE — Patient Instructions (Signed)
Low-fat diet recommended. Resume normal activities as tolerated, as discussed.

## 2013-10-04 NOTE — Progress Notes (Signed)
She is here for a postop visit following laparoscopic cholecystectomy.  Diet is being tolerated, bowels are moving.  No problems with incisions.  PE:  ABD:  Soft, incisions clean/dry/intact and solid.  Assessment:  Doing well postop.  She was noted to be anemic on her preoperative labs (hemoglobin 10.4), but she does not appear to be symptomatic from this.  Plan:  Lowfat diet recommended.  Activities as tolerated.  I have left a message with Dr. Alver Fisher office about her anemia.  I will send him a copy of this note. Return visit with me when necessary.

## 2013-10-17 ENCOUNTER — Other Ambulatory Visit: Payer: Self-pay

## 2013-11-13 ENCOUNTER — Telehealth: Payer: Self-pay | Admitting: Hematology & Oncology

## 2013-11-13 NOTE — Telephone Encounter (Signed)
Left vm w NEW PATIENT today to remind them of their appointment with Dr. Ennever. Also, advised them to bring all meds and insurance information. ° °

## 2013-11-15 ENCOUNTER — Ambulatory Visit (HOSPITAL_BASED_OUTPATIENT_CLINIC_OR_DEPARTMENT_OTHER): Payer: Medicare Other | Admitting: Hematology & Oncology

## 2013-11-15 ENCOUNTER — Other Ambulatory Visit (HOSPITAL_BASED_OUTPATIENT_CLINIC_OR_DEPARTMENT_OTHER): Payer: Medicare Other | Admitting: Lab

## 2013-11-15 ENCOUNTER — Ambulatory Visit: Payer: Medicare Other

## 2013-11-15 VITALS — BP 118/68 | HR 70 | Temp 97.8°F | Resp 14 | Ht 63.0 in | Wt 133.0 lb

## 2013-11-15 DIAGNOSIS — D589 Hereditary hemolytic anemia, unspecified: Secondary | ICD-10-CM

## 2013-11-15 DIAGNOSIS — D649 Anemia, unspecified: Secondary | ICD-10-CM

## 2013-11-15 LAB — CHCC SATELLITE - SMEAR

## 2013-11-15 LAB — CBC WITH DIFFERENTIAL (CANCER CENTER ONLY)
BASO#: 0 10*3/uL (ref 0.0–0.2)
EOS%: 1.4 % (ref 0.0–7.0)
Eosinophils Absolute: 0.1 10*3/uL (ref 0.0–0.5)
HCT: 25.4 % — ABNORMAL LOW (ref 34.8–46.6)
HGB: 9.5 g/dL — ABNORMAL LOW (ref 11.6–15.9)
LYMPH#: 1.1 10*3/uL (ref 0.9–3.3)
MCH: 38.8 pg — ABNORMAL HIGH (ref 26.0–34.0)
MCHC: 37.4 g/dL — ABNORMAL HIGH (ref 32.0–36.0)
MONO%: 6.5 % (ref 0.0–13.0)
NEUT%: 75.4 % (ref 39.6–80.0)
RBC: 2.45 10*6/uL — ABNORMAL LOW (ref 3.70–5.32)

## 2013-11-15 LAB — TECHNOLOGIST REVIEW CHCC SATELLITE

## 2013-11-15 MED ORDER — FOLIC ACID 1 MG PO TABS: 2.0000 mg | ORAL_TABLET | Freq: Every day | ORAL | Status: DC

## 2013-11-15 NOTE — Progress Notes (Signed)
This office note has been dictated.

## 2013-11-18 NOTE — Progress Notes (Signed)
CC:   Christine [name], M.D.  DIAGNOSIS:  Hemolytic anemia.  HISTORY OF PRESENT ILLNESS:  Christine [name] is very charming 67 year old white female.  She certainly looks a lot younger than 67 years old.  She is followed by Christine [name].  She has been very healthy.  She does have some high blood pressure.  There is a past history of, I think, DVT. There is some arthritis.  She has history of "anemia."  She has been told she has been anemic for several years.  She had knee surgery, I think, back in 2008.  At that point in time, she was told that she was anemic.  Going through her labs, back in December 2008, her hemoglobin was 9.3.  In October of 2014, her hemoglobin was 10.4 with hematocrit of 29.2.  She recently had gallbladder surgery.  Of note, her mother had her gallbladder out in her 66s.  Christine [name] did the procedure.  The pathology report ([id number]) showed benign gallbladder with minimal chronic inflammation.  There was a lot of bile, but no actual gallstones were noted.  She does have some fatigue.  She was kindly referred to the [facility] by Christine [name] to help evaluate the anemia.  I think she was transfused back in December 2008.  She has AB positive blood.  An antibody screen was unremarkable.  She has not had any weight loss or weight gain.  She has had no fevers, sweats, or chills.  She has had no nausea or vomiting.  There has been no change in bowel or bladder habits.  She has her routine mammograms.  She has had history of hysterectomy back, I think, when she was 67 years old.  She had an ultrasound of the abdomen done back in June.  This was done because of abdominal pain.  Gallstones were noted.  The spleen was within normal limits.  The liver looked unremarkable.  She has not noted any jaundice.  She has had no rashes.  There has been no pruritus.  There have been no joint issues outside that with her arthritis.  PAST MEDICAL  HISTORY: 1. Hypertension. 2. Cholelithiasis with gallbladder taken out. 3. Situational depression.  ALLERGIES:  None.  MEDICATIONS:  Vitamin C 1000 mg daily; Wellbutrin XL 300 mg p.o. daily; vitamin D 100,000 units p.o. daily; Lexapro 10 mg p.o. daily; Hyzaar (50/12.5) one p.o. daily; vitamin B12, 1000 mcg daily.  SOCIAL HISTORY:  Negative for tobacco use.  She has no significant alcohol use.  Of note, she stopped smoking back in 2010.  She has about a 10-pack-year history of tobacco use.  FAMILY HISTORY:  Remarkable for mother with gallstones.  There is no obvious "anemia" in the family.  REVIEW OF SYSTEMS:  As stated in history of present illness.  No additional findings noted on a 12-system review.  PHYSICAL EXAMINATION:  General:  This is a well-developed, well- nourished white female, in no obvious distress.  She is alert and oriented x3.  Vital Signs:  Temperature of 97.8, pulse 70, respiratory rate 14, blood pressure is 118/68.  Weight is 133 pounds.  Head and Neck:  Normocephalic, atraumatic skull.  There are no ocular or oral lesions.  There are no palpable cervical or supraclavicular lymph nodes. Thyroid is nonpalpable.  Lungs:  Clear to percussion and auscultation bilaterally.  Cardiac:  Regular rate and rhythm with normal S1 and S2. It shows no murmurs, rubs, or bruits.  Abdomen:  Soft.  She has good bowel sounds.  She has well-healed laparoscopy scars.  There is no fluid wave.  There is no palpable hepatomegaly.  The spleen tip may be palpable with deep inspiration.  Back:  No tenderness over the spine, ribs, or hips.  Extremities:  Show no clubbing, cyanosis, or edema.  She has good range motion of her joints.  She has good muscle strength in upper and lower extremities.  Axillary:  No bilateral axillary adenopathy.  Skin:  No rashes, ecchymoses, or petechia.  LABORATORY STUDIES:  White cell count is 7, hemoglobin 9.5, hematocrit 25.4, platelet count 299.  MCV  is 104.  Peripheral smear shows some slight macrocytosis.  She has no schistocytes.  She has a few spherocytes.  I see no rouleaux formation. She has no teardrop cells.  I see no target cells.  White cells appear normal in morphology and maturation.  She has no hypersegmented polys. There are no atypical lymphocytes.  I see no immature myeloid cells. There are no blasts.  Platelets are adequate in number and size.  IMPRESSION:  Christine [name] is a very charming 67 year old white female. She has a chronic anemia.  This certainly looks like it is hemolytic anemia.  We are getting all the studies for this.  Blood smear looks as if this is hemolysis.  She has gallstones, which would suggest a hemolytic anemia.  There also may be some slight splenomegaly by physical exam that could also go along with hemolytic anemia.  I would not think that this is an autoimmune hemolytic anemia.  One has to suspect that this might be more of a hereditary type hemolysis (i.e. spherocytosis).  Unfortunately, we cannot do an osmotic fragility test on her being that this is Friday.  I do not see that we have to do a bone marrow test on her at this point in time.  Again, the smear looks consistent with a hemolytic process.  She has quite a few spherocytes.  I had a nice long talk with Christine [name].  She is very nice.  She is very well educated.  I explained to her what hemolytic anemia was.  I told her that we need to find out what is causing this.  Once we can find the cause, then we can figure out how we can try to treat it.  She does have some fatigue, so we certainly have a reason to correct this.  [name].  I will get her on 2 mg a day for right now.  I think she clearly needs this.  If for some reason, this is not hemolytic anemia, then we may have to look at a bone marrow biopsy as a possible diagnostic test.  Again, we will get Christine [name] on folic acid.  I want to see her  back in 6 weeks' time at which point we really should know if her anemia is improving or if we have to explore other avenues.    ______________________________ Josph Macho, M.D. PRE/MEDQ  D:  11/15/2013  T:  11/18/2013  Job:  [id number]

## 2013-11-21 LAB — HEPATIC FUNCTION PANEL
ALT: 16 U/L (ref 0–35)
AST: 21 U/L (ref 0–37)
Albumin: 4.1 g/dL (ref 3.5–5.2)
Bilirubin, Direct: 0.5 mg/dL — ABNORMAL HIGH (ref 0.0–0.3)
Indirect Bilirubin: 2.1 mg/dL — ABNORMAL HIGH (ref 0.0–0.9)
Total Protein: 6.6 g/dL (ref 6.0–8.3)

## 2013-11-21 LAB — LACTATE DEHYDROGENASE: LDH: 217 U/L (ref 94–250)

## 2013-11-21 LAB — RETICULOCYTES (CHCC)
RBC.: 2.94 MIL/uL — ABNORMAL LOW (ref 3.87–5.11)
Retic Ct Pct: 5.1 % — ABNORMAL HIGH (ref 0.4–2.3)

## 2013-11-21 LAB — DIRECT ANTIGLOBULIN TEST (NOT AT ARMC)

## 2013-11-21 LAB — COLD AGGLUTININ TITER: Cold Agglutinin Titer: 1:1280 {titer} — AB

## 2013-11-27 ENCOUNTER — Encounter: Payer: Self-pay | Admitting: Hematology & Oncology

## 2013-12-23 ENCOUNTER — Encounter: Payer: Self-pay | Admitting: Hematology & Oncology

## 2013-12-23 ENCOUNTER — Other Ambulatory Visit (HOSPITAL_BASED_OUTPATIENT_CLINIC_OR_DEPARTMENT_OTHER): Payer: Medicare Other | Admitting: Lab

## 2013-12-23 ENCOUNTER — Ambulatory Visit (HOSPITAL_BASED_OUTPATIENT_CLINIC_OR_DEPARTMENT_OTHER): Payer: Medicare Other | Admitting: Hematology & Oncology

## 2013-12-23 VITALS — BP 139/71 | HR 68 | Temp 97.7°F | Resp 14 | Ht 65.0 in | Wt 136.0 lb

## 2013-12-23 DIAGNOSIS — D591 Autoimmune hemolytic anemia, unspecified: Secondary | ICD-10-CM

## 2013-12-23 DIAGNOSIS — D5912 Cold autoimmune hemolytic anemia: Secondary | ICD-10-CM | POA: Insufficient documentation

## 2013-12-23 DIAGNOSIS — D589 Hereditary hemolytic anemia, unspecified: Secondary | ICD-10-CM

## 2013-12-23 HISTORY — DX: Cold autoimmune hemolytic anemia: D59.12

## 2013-12-23 LAB — RETICULOCYTES (CHCC)
ABS RETIC: 136.8 10*3/uL (ref 19.0–186.0)
RBC.: 3.11 MIL/uL — AB (ref 3.87–5.11)
RETIC CT PCT: 4.4 % — AB (ref 0.4–2.3)

## 2013-12-23 LAB — CBC WITH DIFFERENTIAL (CANCER CENTER ONLY)
BASO#: 0 10*3/uL (ref 0.0–0.2)
BASO%: 0.7 % (ref 0.0–2.0)
EOS%: 2 % (ref 0.0–7.0)
Eosinophils Absolute: 0.1 10*3/uL (ref 0.0–0.5)
HEMATOCRIT: UNDETERMINED % (ref 34.8–46.6)
HEMOGLOBIN: 10 g/dL — AB (ref 11.6–15.9)
LYMPH#: 1.5 10*3/uL (ref 0.9–3.3)
LYMPH%: 27.4 % (ref 14.0–48.0)
MCH: UNDETERMINED pg (ref 26.0–34.0)
MCHC: UNDETERMINED g/dL (ref 32.0–36.0)
MCV: UNDETERMINED fL (ref 81–101)
MONO#: 0.5 10*3/uL (ref 0.1–0.9)
MONO%: 8.4 % (ref 0.0–13.0)
NEUT#: 3.4 10*3/uL (ref 1.5–6.5)
NEUT%: 61.5 % (ref 39.6–80.0)
PLATELETS: 316 10*3/uL (ref 145–400)
RBC: UNDETERMINED 10*6/uL (ref 3.70–5.32)
RDW: UNDETERMINED % (ref 11.1–15.7)
WBC: 5.6 10*3/uL (ref 3.9–10.0)

## 2013-12-23 LAB — LACTATE DEHYDROGENASE: LDH: 205 U/L (ref 94–250)

## 2013-12-23 LAB — CHCC SATELLITE - SMEAR

## 2013-12-23 NOTE — Progress Notes (Signed)
This office note has been dictated.

## 2013-12-24 NOTE — Progress Notes (Signed)
CC:   Janalyn Rouse, M.D.  DIAGNOSIS:  Cold agglutinin hemolytic anemia.  CURRENT THERAPY:  Folic acid 2 mg p.o. daily.  INTERIM HISTORY:  Ms. Boehm comes in for second office visit.  I think I saw her back in December.  At that point in time, we went ahead and did a complete hemolytic anemia workup on her.  We found she had a very high titer of a cold agglutinin.  Her titer was 1:1208.  Her LDH is normal at 217.  Her total bilirubin was only 2.6 with an indirect of 2.1.  She otherwise has been feeling pretty well.  She has had no abdominal pain.  There has been no change in bowel or bladder habits.  She has had no cough or shortness of breath.  She has had no rashes.  PHYSICAL EXAMINATION:  General:  This is a fairly well-developed, well- nourished, white female who looks lot younger than her stated age. Vital Signs:  Temperature of 97.7, pulse 68, respiratory rate 14, blood pressure 139/71, weight is 136 pounds.  Head and Neck:  Normocephalic, atraumatic skull.  There are no ocular or oral lesions.  There are no palpable cervical or supraclavicular lymph nodes.  Lungs:  Clear bilaterally.  Cardiac:  Regular rate and rhythm with normal S1, S2. There are no murmurs, rubs, or bruits.  Abdomen:  Soft.  She has good bowel sounds.  There is no fluid wave.  There is no palpable abdominal mass.  There is no palpable hepatosplenomegaly.  Back:  No tenderness over the spine, ribs, or hips.  Extremities:  No clubbing, cyanosis, or edema.  She has good range motion of her joints.  There is no swelling or erythema over her joints.  Skin:  No rashes, ecchymosis, or petechia. Neurologic:  No focal neurological deficit.  LABORATORY STUDIES:  White cell count 5.6, hemoglobin 10, platelet count 316,000.  On her peripheral smear, she does have some red cell clumping.  This probably would be considered rouleaux formation.  No atypical lymphocytes noted.  There are no blasts.  She has some  slight polychromasia.  She has no hypersegmented polys.  I really cannot detect much in the way of typical lymphocytes.  IMPRESSION:  Ms. Mcclaskey is a very nice 68 year old white female.  She has a cold agglutinin hemolytic anemia.  She has very high level of cold agglutinin titer.  At this point in time, she is pretty much asymptomatic with this.  Her hemoglobin is holding pretty steady.  The folic acid, I suspect, is probably helping.  I do not think we have to embark upon a major workup here.  I do not think she needs the bone marrow test.  I do not think we needs any scans.  I cannot find anything on her physical exam that would suggest an underlying lymphoproliferative process.  If we do find that she has an exacerbation of the hemolysis, then we may need to do a workup.  I spent a good 40 minutes or so with her today.  I talked to her about what was going on.  I explained to her what a cold agglutinin protein wise.  She has good understand of this.  I told her that in one time she has to bundle up.  She has to wear gloves.  She has to wear socks.  She has to wear a hat to cover her head.  Her blood count will tended to decrease during cold months as her  cold agglutinin titer strengthens.  I do want to follow her in about a month or so.  We should be able to see changes with her hemoglobin depending on the season that we are in.    ______________________________ Volanda Napoleon, M.D. PRE/MEDQ  D:  12/23/2013  T:  12/24/2013  Job:  [id number]

## 2013-12-27 LAB — RBC OSMOTIC FRAGILITY
NACL  0.35%: 85 % (ref 72–100)
NACL  0.40%: 75 % (ref 65–100)
NACL  0.50%: 63 % (ref 36–88)
NACL  0.55%: 52 % (ref 5–70)
NACL  0.65%: 15 % (ref 0–19)
NACL 0.30%: 87 % (ref 80–100)
NACL 0.45%: 70 % (ref 54–96)
NACL 0.60%: 32 % (ref 0–40)
NACL 0.85%: 0 % (ref 0–0)

## 2014-01-23 ENCOUNTER — Encounter: Payer: Self-pay | Admitting: Hematology & Oncology

## 2014-01-27 ENCOUNTER — Other Ambulatory Visit (HOSPITAL_BASED_OUTPATIENT_CLINIC_OR_DEPARTMENT_OTHER): Payer: Medicare Other | Admitting: Lab

## 2014-01-27 ENCOUNTER — Other Ambulatory Visit (HOSPITAL_COMMUNITY)
Admission: RE | Admit: 2014-01-27 | Discharge: 2014-01-27 | Disposition: A | Discharge status: Home / Self Care | Payer: Medicare Other | Source: Ambulatory Visit | Attending: Hematology & Oncology | Admitting: Hematology & Oncology

## 2014-01-27 ENCOUNTER — Encounter: Payer: Self-pay | Admitting: Hematology & Oncology

## 2014-01-27 ENCOUNTER — Ambulatory Visit (HOSPITAL_BASED_OUTPATIENT_CLINIC_OR_DEPARTMENT_OTHER): Payer: Medicare Other | Admitting: Hematology & Oncology

## 2014-01-27 VITALS — BP 129/65 | HR 76 | Temp 97.7°F | Resp 14 | Ht 65.0 in | Wt 138.0 lb

## 2014-01-27 DIAGNOSIS — D5912 Cold autoimmune hemolytic anemia: Secondary | ICD-10-CM

## 2014-01-27 DIAGNOSIS — D591 Autoimmune hemolytic anemia, unspecified: Secondary | ICD-10-CM

## 2014-01-27 DIAGNOSIS — D599 Acquired hemolytic anemia, unspecified: Secondary | ICD-10-CM | POA: Insufficient documentation

## 2014-01-27 LAB — CBC WITH DIFFERENTIAL (CANCER CENTER ONLY)
BASO#: 0.1 10*3/uL (ref 0.0–0.2)
BASO%: 0.8 % (ref 0.0–2.0)
EOS ABS: 0.1 10*3/uL (ref 0.0–0.5)
EOS%: 2.2 % (ref 0.0–7.0)
HCT: 23.7 % — ABNORMAL LOW (ref 34.8–46.6)
HEMOGLOBIN: 8.3 g/dL — AB (ref 11.6–15.9)
LYMPH#: 1.6 10*3/uL (ref 0.9–3.3)
LYMPH%: 26.3 % (ref 14.0–48.0)
MCH: 35.8 pg — ABNORMAL HIGH (ref 26.0–34.0)
MCHC: 35 g/dL (ref 32.0–36.0)
MCV: 102 fL — AB (ref 81–101)
MONO#: 0.5 10*3/uL (ref 0.1–0.9)
MONO%: 8.8 % (ref 0.0–13.0)
NEUT#: 3.7 10*3/uL (ref 1.5–6.5)
NEUT%: 61.9 % (ref 39.6–80.0)
PLATELETS: 275 10*3/uL (ref 145–400)
RBC: 2.32 10*6/uL — AB (ref 3.70–5.32)
RDW: 15.2 % (ref 11.1–15.7)
WBC: 5.9 10*3/uL (ref 3.9–10.0)

## 2014-01-27 NOTE — Progress Notes (Signed)
Hematology and Oncology Follow Up Visit  DIVA LEMBERGER [id number] 05/31/1946 68 y.o. 01/27/2014   Principle Diagnosis:   Cold agglutinin hemolytic anemia  Current Therapy: Folic acid 85m po qd \       nteriasted m History:  Ms.  RKaczynskiis in for followup. She feels okay. She still exercising. She does state that she might be a little bit more tired.  She's had no bleeding. There is no abdominal pain. She's had no bruising. She's had no change in bowel or bladder habits.  She's not noted any palpable lymph glands. There's been no rashes. She's had no leg swelling.    Medications: Current outpatient prescriptions:Ascorbic Acid (VITAMIN C) 1000 MG tablet, Take 1,000 mg by mouth daily., Disp: , Rfl: ;  buPROPion (WELLBUTRIN XL) 300 MG 24 hr tablet, Take 300 mg by mouth every morning. , Disp: , Rfl: ;  Cholecalciferol (VITAMIN D-3) 1000 UNITS CAPS, Take by mouth every morning., Disp: , Rfl: ;  escitalopram (LEXAPRO) 10 MG tablet, Take 10 mg by mouth daily., Disp: , Rfl:  folic acid (FOLVITE) 1 MG tablet, Take 2 tablets (2 mg total) by mouth daily., Disp: 60 tablet, Rfl: 12;  losartan-hydrochlorothiazide (HYZAAR) 50-12.5 MG per tablet, Take 1 tablet by mouth every morning. , Disp: , Rfl: ;  LYSINE PO, Take 1 tablet by mouth daily., Disp: , Rfl: ;  magnesium 30 MG tablet, Take 30 mg by mouth every morning., Disp: , Rfl: ;  vitamin B-12 (CYANOCOBALAMIN) 1000 MCG tablet, Take 1,000 mcg by mouth daily., Disp: , Rfl:   Allergies: No Known Allergies  Past Medical History, Surgical history, Social history, and Family History were reviewed and updated.  Review of Systems: None  Physical Exam:  height is _0  (1.651 m) and weight is 138 lb (62.596 kg). Her oral temperature is 97.7 F (36.5 C). Her blood pressure is 129/65 and her pulse is 76. Her respiration is 14.   PT white female in no obvious distress. Head and neck exam shows no ocular or oral lesions. There is no scleral icterus.  She has no adenopathy in the neck. Lungs are clear. Cardiac exam regular in rhythm with no murmurs rubs or bruits. Abdomen is soft. She has good bowel sounds. There is no fluid wave. There is no palpable hepato- splenomegaly. Back exam no tenderness over the spine. Extremities no clubbing cyanosis or edema. Skin exam no rashes.  Lab Results  Component Value Date   WBC 5.9 01/27/2014   HGB 8.3* 01/27/2014   HCT 23.7* 01/27/2014   MCV 102* 01/27/2014   PLT 275 01/27/2014     Chemistry      Component Value Date/Time   NA 129* 09/17/2013 1425   K 5.2* 09/17/2013 1425   CL 94* 09/17/2013 1425   CO2 26 09/17/2013 1425   BUN 12 09/17/2013 1425   CREATININE 0.68 09/17/2013 1425      Component Value Date/Time   CALCIUM 9.5 09/17/2013 1425   ALKPHOS 65 11/15/2013 1217   AST 21 11/15/2013 1217   ALT 16 11/15/2013 1217   BILITOT 2.6* 11/15/2013 1217     Blood smear shows some rouleaux. She has some r Red cell clumping No atypical white cells are noted. I do not see any blasts. There is no hypersegmented polys.   Impression and Plan: Ms. RMckeagis a 68year old female. She is cold agglutinin hemolytic anemia. With the cold weather, I expect her red cell count be down a little bit.  She is asymptomatic with this.  We talked a lot about treatment options. I told her that if we needed to do treatment, and that we would consider rituximab. I this would be a reasonable. I would like to think that we can use this as a single agent and not use chemotherapy with it.  I am sending off her blood for flow cytometry to see if there is Legrand Como population of cells. If so, we may have to consider a bone marrow biopsy on her and or and a CT scan. She did have a ultrasound of the abdomen back in June of 2014 which was unremarkable.  I spent 30 minutes with her today. I reviewed her blood her with her. I explained our way cold agglutinin was and showed her what her levels were.  I the next time we see her in one month,  this will be important.  Volanda Napoleon, MD 2/16/20154:57 PM

## 2014-01-30 LAB — FLOW CYTOMETRY - CHCC SATELLITE

## 2014-01-31 LAB — HEPATIC FUNCTION PANEL
ALT: 18 U/L (ref 0–35)
AST: 22 U/L (ref 0–37)
Albumin: 3.9 g/dL (ref 3.5–5.2)
Alkaline Phosphatase: 66 U/L (ref 39–117)
Bilirubin, Direct: 0.4 mg/dL — ABNORMAL HIGH (ref 0.0–0.3)
Indirect Bilirubin: 1.4 mg/dL — ABNORMAL HIGH (ref 0.2–1.2)
TOTAL PROTEIN: 6.1 g/dL (ref 6.0–8.3)
Total Bilirubin: 1.8 mg/dL — ABNORMAL HIGH (ref 0.2–1.2)

## 2014-01-31 LAB — RETICULOCYTES (CHCC)
ABS Retic: 181.8 10*3/uL (ref 19.0–186.0)
RBC.: 2.56 MIL/uL — ABNORMAL LOW (ref 3.87–5.11)
RETIC CT PCT: 7.1 % — AB (ref 0.4–2.3)

## 2014-01-31 LAB — LACTATE DEHYDROGENASE: LDH: 209 U/L (ref 94–250)

## 2014-01-31 LAB — HAPTOGLOBIN

## 2014-01-31 LAB — COLD AGGLUTININ TITER: Cold Agglutinin Titer: 1:20480 {titer} — AB

## 2014-02-03 ENCOUNTER — Encounter: Payer: Self-pay | Admitting: Hematology & Oncology

## 2014-02-06 ENCOUNTER — Other Ambulatory Visit: Payer: Self-pay | Admitting: Hematology & Oncology

## 2014-02-06 ENCOUNTER — Encounter: Payer: Self-pay | Admitting: Hematology & Oncology

## 2014-02-06 DIAGNOSIS — D47Z9 Other specified neoplasms of uncertain behavior of lymphoid, hematopoietic and related tissue: Secondary | ICD-10-CM

## 2014-02-06 DIAGNOSIS — C8589 Other specified types of non-Hodgkin lymphoma, extranodal and solid organ sites: Secondary | ICD-10-CM

## 2014-02-06 DIAGNOSIS — D591 Other autoimmune hemolytic anemias: Principal | ICD-10-CM

## 2014-02-06 DIAGNOSIS — D5912 Cold autoimmune hemolytic anemia: Secondary | ICD-10-CM

## 2014-02-07 ENCOUNTER — Telehealth: Payer: Self-pay | Admitting: Hematology & Oncology

## 2014-02-07 ENCOUNTER — Other Ambulatory Visit: Payer: Self-pay | Admitting: Hematology & Oncology

## 2014-02-07 DIAGNOSIS — D5912 Cold autoimmune hemolytic anemia: Secondary | ICD-10-CM

## 2014-02-07 DIAGNOSIS — D591 Other autoimmune hemolytic anemias: Principal | ICD-10-CM

## 2014-02-07 NOTE — Telephone Encounter (Signed)
Pt aware of 3-5 lab ,CT to drink at 10,11 am and to be NPO 4 hours. She is aware of 3-17 BMBX at [facility] and to be NPO after midnight and she needs a driver. Pt is also aware of 3-31 MD appointment

## 2014-02-10 DIAGNOSIS — L988 Other specified disorders of the skin and subcutaneous tissue: Secondary | ICD-10-CM | POA: Insufficient documentation

## 2014-02-10 DIAGNOSIS — L908 Other atrophic disorders of skin: Secondary | ICD-10-CM

## 2014-02-13 ENCOUNTER — Encounter (HOSPITAL_BASED_OUTPATIENT_CLINIC_OR_DEPARTMENT_OTHER): Payer: Self-pay

## 2014-02-13 ENCOUNTER — Ambulatory Visit (HOSPITAL_BASED_OUTPATIENT_CLINIC_OR_DEPARTMENT_OTHER)
Admission: RE | Admit: 2014-02-13 | Discharge: 2014-02-13 | Disposition: A | Discharge status: Home / Self Care | Payer: Medicare Other | Source: Ambulatory Visit | Attending: Hematology & Oncology | Admitting: Hematology & Oncology

## 2014-02-13 ENCOUNTER — Other Ambulatory Visit (HOSPITAL_BASED_OUTPATIENT_CLINIC_OR_DEPARTMENT_OTHER): Payer: Medicare Other | Admitting: Lab

## 2014-02-13 DIAGNOSIS — D591 Autoimmune hemolytic anemia, unspecified: Secondary | ICD-10-CM

## 2014-02-13 DIAGNOSIS — C8589 Other specified types of non-Hodgkin lymphoma, extranodal and solid organ sites: Secondary | ICD-10-CM | POA: Insufficient documentation

## 2014-02-13 DIAGNOSIS — D5912 Cold autoimmune hemolytic anemia: Secondary | ICD-10-CM

## 2014-02-13 LAB — CMP (CANCER CENTER ONLY)
ALK PHOS: 63 U/L (ref 26–84)
ALT(SGPT): 15 U/L (ref 10–47)
AST: 17 U/L (ref 11–38)
Albumin: 4 g/dL (ref 3.3–5.5)
BILIRUBIN TOTAL: 2.3 mg/dL — AB (ref 0.20–1.60)
BUN: 13 mg/dL (ref 7–22)
CO2: 32 mEq/L (ref 18–33)
Calcium: 9.3 mg/dL (ref 8.0–10.3)
Chloride: 97 mEq/L — ABNORMAL LOW (ref 98–108)
Creat: 0.4 mg/dl — ABNORMAL LOW (ref 0.6–1.2)
Glucose, Bld: 97 mg/dL (ref 73–118)
POTASSIUM: 4.9 meq/L — AB (ref 3.3–4.7)
Sodium: 133 mEq/L (ref 128–145)
TOTAL PROTEIN: 7.1 g/dL (ref 6.4–8.1)

## 2014-02-13 MED ORDER — IOHEXOL 300 MG/ML  SOLN
100.0000 mL | Freq: Once | INTRAMUSCULAR | Status: AC | PRN
  Administered 2014-02-13: 100 mL via INTRAVENOUS

## 2014-02-14 ENCOUNTER — Encounter: Payer: Self-pay | Admitting: Hematology & Oncology

## 2014-02-21 ENCOUNTER — Encounter (HOSPITAL_COMMUNITY): Payer: Self-pay | Admitting: Pharmacy Technician

## 2014-02-23 ENCOUNTER — Emergency Department (HOSPITAL_COMMUNITY): Payer: Medicare Other

## 2014-02-23 ENCOUNTER — Emergency Department (HOSPITAL_COMMUNITY)
Admission: EM | Admit: 2014-02-23 | Discharge: 2014-02-23 | Disposition: A | Discharge status: Home / Self Care | Payer: Medicare Other | Attending: Emergency Medicine | Admitting: Emergency Medicine

## 2014-02-23 ENCOUNTER — Encounter (HOSPITAL_COMMUNITY): Payer: Self-pay | Admitting: Emergency Medicine

## 2014-02-23 DIAGNOSIS — S4980XA Other specified injuries of shoulder and upper arm, unspecified arm, initial encounter: Secondary | ICD-10-CM | POA: Insufficient documentation

## 2014-02-23 DIAGNOSIS — S2232XA Fracture of one rib, left side, initial encounter for closed fracture: Secondary | ICD-10-CM

## 2014-02-23 DIAGNOSIS — Y9389 Activity, other specified: Secondary | ICD-10-CM | POA: Insufficient documentation

## 2014-02-23 DIAGNOSIS — Z87891 Personal history of nicotine dependence: Secondary | ICD-10-CM | POA: Insufficient documentation

## 2014-02-23 DIAGNOSIS — R296 Repeated falls: Secondary | ICD-10-CM | POA: Insufficient documentation

## 2014-02-23 DIAGNOSIS — Z8739 Personal history of other diseases of the musculoskeletal system and connective tissue: Secondary | ICD-10-CM | POA: Insufficient documentation

## 2014-02-23 DIAGNOSIS — S2239XA Fracture of one rib, unspecified side, initial encounter for closed fracture: Secondary | ICD-10-CM | POA: Insufficient documentation

## 2014-02-23 DIAGNOSIS — Z79899 Other long term (current) drug therapy: Secondary | ICD-10-CM | POA: Insufficient documentation

## 2014-02-23 DIAGNOSIS — D649 Anemia, unspecified: Secondary | ICD-10-CM | POA: Insufficient documentation

## 2014-02-23 DIAGNOSIS — S46909A Unspecified injury of unspecified muscle, fascia and tendon at shoulder and upper arm level, unspecified arm, initial encounter: Secondary | ICD-10-CM | POA: Insufficient documentation

## 2014-02-23 DIAGNOSIS — Z86718 Personal history of other venous thrombosis and embolism: Secondary | ICD-10-CM | POA: Insufficient documentation

## 2014-02-23 DIAGNOSIS — Y929 Unspecified place or not applicable: Secondary | ICD-10-CM | POA: Insufficient documentation

## 2014-02-23 DIAGNOSIS — I10 Essential (primary) hypertension: Secondary | ICD-10-CM | POA: Insufficient documentation

## 2014-02-23 DIAGNOSIS — R55 Syncope and collapse: Secondary | ICD-10-CM | POA: Insufficient documentation

## 2014-02-23 DIAGNOSIS — IMO0002 Reserved for concepts with insufficient information to code with codable children: Secondary | ICD-10-CM | POA: Insufficient documentation

## 2014-02-23 LAB — I-STAT CHEM 8, ED
BUN: 14 mg/dL (ref 6–23)
Calcium, Ion: 1.23 mmol/L (ref 1.13–1.30)
Chloride: 96 mEq/L (ref 96–112)
Creatinine, Ser: 0.7 mg/dL (ref 0.50–1.10)
Glucose, Bld: 109 mg/dL — ABNORMAL HIGH (ref 70–99)
HCT: 30 % — ABNORMAL LOW (ref 36.0–46.0)
Hemoglobin: 10.2 g/dL — ABNORMAL LOW (ref 12.0–15.0)
Potassium: 3.8 mEq/L (ref 3.7–5.3)
Sodium: 135 mEq/L — ABNORMAL LOW (ref 137–147)
TCO2: 27 mmol/L (ref 0–100)

## 2014-02-23 MED ORDER — OXYCODONE-ACETAMINOPHEN 5-325 MG PO TABS: 1.0000 | ORAL_TABLET | ORAL | Status: DC | PRN

## 2014-02-23 MED ORDER — CYCLOBENZAPRINE HCL 10 MG PO TABS: 10.0000 mg | ORAL_TABLET | Freq: Three times a day (TID) | ORAL | Status: DC | PRN

## 2014-02-23 MED ORDER — LORAZEPAM 1 MG PO TABS
1.0000 mg | ORAL_TABLET | Freq: Once | ORAL | Status: AC
  Administered 2014-02-23: 1 mg via ORAL
  Filled 2014-02-23: qty 1

## 2014-02-23 MED ORDER — IBUPROFEN 800 MG PO TABS
800.0000 mg | ORAL_TABLET | Freq: Once | ORAL | Status: AC
  Administered 2014-02-23: 800 mg via ORAL
  Filled 2014-02-23: qty 1

## 2014-02-23 MED ORDER — DOCUSATE SODIUM 100 MG PO CAPS: 100.0000 mg | ORAL_CAPSULE | Freq: Two times a day (BID) | ORAL | Status: DC | PRN

## 2014-02-23 MED ORDER — HYDROMORPHONE HCL PF 1 MG/ML IJ SOLN
1.0000 mg | Freq: Once | INTRAMUSCULAR | Status: AC
  Administered 2014-02-23: 1 mg via INTRAMUSCULAR
  Filled 2014-02-23: qty 1

## 2014-02-23 NOTE — ED Provider Notes (Signed)
CSN: [id number]     Arrival date & time 02/23/14  1241 History   First MD Initiated Contact with Patient 02/23/14 1423     Chief Complaint  Patient presents with  . Fall  . Shoulder Pain     (Consider location/radiation/quality/duration/timing/severity/associated sxs/prior Treatment) HPI  67yF with L shoulder, L upper back, L axillary pain after fall. Woke around 0400 today to use bathroom. As was ambulating, felt clammy and like may pass out. Nothing around her to hold on to and fell onto L side. Did not lose consciousness. Persistent pain since. Once woke up this morning had a similar event but thinks that may have been because pain was so severe. Pain worse with respiration and movement. No SOB. No cough. No HA or neck pain. Daughter gave pt 50mg  tramadol around 1000 with little relief. No hx of syncope. Currently being worked up for anemia. Thinks hemoglobin around 0800.   Past Medical History  Diagnosis Date  . Hypertension   . Deep venous thrombosis of calf   . Arthritis     hands  . History of blood transfusion 2009  . PONV (postoperative nausea and vomiting)     nausea only yrs ago  . 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 Joint replacement Left     knee  . Abdominal hysterectomy  age 19  . Cataract surgery Bilateral 2011  . Cholecystectomy N/A 09/20/2013    Procedure: LAPAROSCOPIC CHOLECYSTECTOMY WITH INTRAOPERATIVE CHOLANGIOGRAM;  Surgeon: Odis Hollingshead, MD;  Location: [facility];  Service: General;  Laterality: N/A;   History reviewed. No pertinent family history. History  Substance Use Topics  . Smoking status: Former Smoker -- 0.50 packs/day for 20 years    Types: Cigarettes    Start date: 01/28/1964    Quit date: 09/02/2009  . Smokeless tobacco: Never Used     Comment: quit 5 years ago  . Alcohol Use: Yes     Comment: 2 glasses wine per day   OB History   Grav Para Term Preterm Abortions TAB SAB Ect Mult  Living                 Review of Systems  All systems reviewed and negative, other than as noted in HPI.   Allergies  Bee venom  Home Medications   Current Outpatient Rx  Name  Route  Sig  Dispense  Refill  . Ascorbic Acid (VITAMIN C) 1000 MG tablet   Oral   Take 1,000 mg by mouth daily.         Marland Kitchen buPROPion (WELLBUTRIN XL) 300 MG 24 hr tablet   Oral   Take 300 mg by mouth every morning.          . Cholecalciferol (VITAMIN D3) 2000 UNITS TABS   Oral   Take 1 tablet by mouth daily.         Marland Kitchen escitalopram (LEXAPRO) 10 MG tablet   Oral   Take 10 mg by mouth every morning.          . folic acid (FOLVITE) 1 MG tablet   Oral   Take 2 tablets (2 mg total) by mouth daily.   60 tablet   12   . losartan-hydrochlorothiazide (HYZAAR) 100-25 MG per tablet   Oral   Take 1 tablet by mouth every morning.         Marland Kitchen LYSINE PO   Oral   Take 1 tablet by  mouth daily.         . magnesium gluconate (MAGONATE) 500 MG tablet   Oral   Take 500 mg by mouth daily.         . vitamin B-12 (CYANOCOBALAMIN) 1000 MCG tablet   Oral   Take 1,000 mcg by mouth daily.          BP 133/81  Pulse 67  Temp(Src) 97.6 F (36.4 C) (Oral)  Resp 18  SpO2 100% Physical Exam  Nursing note and vitals reviewed. Constitutional: She is oriented to person, place, and time. She appears well-developed and well-nourished. No distress.  HENT:  Head: Normocephalic and atraumatic.  Eyes: Conjunctivae are normal. Right eye exhibits no discharge. Left eye exhibits no discharge.  Neck: Neck supple.  Cardiovascular: Normal rate, regular rhythm and normal heart sounds.  Exam reveals no gallop and no friction rub.   No murmur heard. Pulmonary/Chest: Effort normal and breath sounds normal. No respiratory distress.  Abdominal: Soft. She exhibits no distension. There is no tenderness.  Musculoskeletal: She exhibits no edema and no tenderness.  No midline spinal tenderness. Tender L scapular region  and L axilla. No crepitus. No clavicular or shoulder tenderness. Able to actively range L shoulder although with increased pain in back, particularly with abduction. No humerus/wlbow tenderness. NVI distally.   Neurological: She is alert and oriented to person, place, and time. No cranial nerve deficit. She exhibits normal muscle tone. Coordination normal.  Skin: Skin is warm and dry.  Psychiatric: She has a normal mood and affect. Her behavior is normal. Thought content normal.    ED Course  Procedures (including critical care time)  Definitve Fracture Care. Closed treatment of uncomplicated rib fracture: 1 (one):  Definitive fracture care was provided for a single closed rib fracture. Treatment including pain medication, discussion of possible complications including pneumonia, discussion of usage of incentive spirometry, and referral to primary care physician (or resource list provided) for further followup.   Labs Review Labs Reviewed - No data to display Imaging Review No results found.   EKG Interpretation   Date/Time:  Sunday February 23 2014 16:22:59 EDT Ventricular Rate:  63 PR Interval:  191 QRS Duration: 96 QT Interval:  437 QTC Calculation: 447 R Axis:   -6 Text Interpretation:  Sinus rhythm Anterior infarct, old ED PHYSICIAN  INTERPRETATION AVAILABLE IN [facility] [place] Confirmed by TEST, Record  ([id number]) on 02/25/2014 9:48:10 AM      MDM   Final diagnoses:  Closed fracture of rib of left side  Near syncope    67yF with L upper back pain. Imaging significant for L posterior 4th rib fx. No pneumothorax. Pain meds. Incentive spirometry.     Virgel Manifold, MD 02/25/14 2238

## 2014-02-23 NOTE — Discharge Instructions (Signed)
Near-Syncope Near-syncope (commonly known as near fainting) is sudden weakness, dizziness, or feeling like you might pass out. During an episode of near-syncope, you may also develop pale skin, have tunnel vision, or feel sick to your stomach (nauseous). Near-syncope may occur when getting up after sitting or while standing for a long time. It is caused by a sudden decrease in blood flow to the brain. This decrease can result from various causes or triggers, most of which are not serious. However, because near-syncope can sometimes be a sign of something serious, a medical evaluation is required. The specific cause is often not determined. HOME CARE INSTRUCTIONS  Monitor your condition for any changes. The following actions may help to alleviate any discomfort you are experiencing:  Have someone stay with you until you feel stable.  Lie down right away if you start feeling like you might faint. Breathe deeply and steadily. Wait until all the symptoms have passed. Most of these episodes last only a few minutes. You may feel tired for several hours.   Drink enough fluids to keep your urine clear or pale yellow.   If you are taking blood pressure or heart medicine, get up slowly when seated or lying down. Take several minutes to sit and then stand. This can reduce dizziness.  Follow up with your health care provider as directed. SEEK IMMEDIATE MEDICAL CARE IF:   You have a severe headache.   You have unusual pain in the chest, abdomen, or back.   You are bleeding from the mouth or rectum, or you have black or tarry stool.   You have an irregular or very fast heartbeat.   You have repeated fainting or have seizure-like jerking during an episode.   You faint when sitting or lying down.   You have confusion.   You have difficulty walking.   You have severe weakness.   You have vision problems.  MAKE SURE YOU:   Understand these instructions.  Will watch your  condition.  Will get help right away if you are not doing well or get worse. Document Released: 11/28/2005 Document Revised: 07/31/2013 Document Reviewed: 05/03/2013 [facility] Patient Information 2014 [place].  Rib Fracture A rib fracture is a break or crack in one of the bones of the ribs. The ribs are a group of long, curved bones that wrap around your chest and attach to your spine. They protect your lungs and other organs in the chest cavity. A broken or cracked rib is often painful, but most do not cause other problems. Most rib fractures heal on their own over time. However, rib fractures can be more serious if multiple ribs are broken or if broken ribs move out of place and push against other structures. CAUSES   A direct blow to the chest. For example, this could happen during contact sports, a car accident, or a fall against a hard object.  Repetitive movements with high force, such as pitching a baseball or having severe coughing spells. SYMPTOMS   Pain when you breathe in or cough.  Pain when someone presses on the injured area. DIAGNOSIS  Your caregiver will perform a physical exam. Various imaging tests may be ordered to confirm the diagnosis and to look for related injuries. These tests may include a chest X-ray, computed tomography (CT), magnetic resonance imaging (MRI), or a bone scan. TREATMENT  Rib fractures usually heal on their own in 1 3 months. The longer healing period is often associated with a continued cough or  other aggravating activities. During the healing period, pain control is very important. Medication is usually given to control pain. Hospitalization or surgery may be needed for more severe injuries, such as those in which multiple ribs are broken or the ribs have moved out of place.  HOME CARE INSTRUCTIONS   Avoid strenuous activity and any activities or movements that cause pain. Be careful during activities and avoid bumping the injured  rib.  Gradually increase activity as directed by your caregiver.  Only take over-the-counter or prescription medications as directed by your caregiver. Do not take other medications without asking your caregiver first.  Apply ice to the injured area for the first 1 2 days after you have been treated or as directed by your caregiver. Applying ice helps to reduce inflammation and pain.  Put ice in a plastic bag.  Place a towel between your skin and the bag.   Leave the ice on for 15 20 minutes at a time, every 2 hours while you are awake.  Perform deep breathing as directed by your caregiver. This will help prevent pneumonia, which is a common complication of a broken rib. Your caregiver may instruct you to:  Take deep breaths several times a day.  Try to cough several times a day, holding a pillow against the injured area.  Use a device called an incentive spirometer to practice deep breathing several times a day.  Drink enough fluids to keep your urine clear or pale yellow. This will help you avoid constipation.   Do not wear a rib belt or binder. These restrict breathing, which can lead to pneumonia.  SEEK IMMEDIATE MEDICAL CARE IF:   You have a fever.   You have difficulty breathing or shortness of breath.   You develop a continual cough, or you cough up thick or bloody sputum.  You feel sick to your stomach (nausea), throw up (vomit), or have abdominal pain.   You have worsening pain not controlled with medications.  MAKE SURE YOU:  Understand these instructions.  Will watch your condition.  Will get help right away if you are not doing well or get worse. Document Released: 11/28/2005 Document Revised: 07/31/2013 Document Reviewed: 01/30/2013 [facility] Patient Information 2014 [place], [place].

## 2014-02-23 NOTE — ED Notes (Signed)
Reports having dizziness last night when she stood up and had a fall, having severe left shoulder pain from the fall. +radial pulse, decreased rom. Pt reports recently being treated for anemia.

## 2014-02-24 ENCOUNTER — Ambulatory Visit: Payer: Medicare Other | Admitting: Hematology & Oncology

## 2014-02-24 ENCOUNTER — Other Ambulatory Visit: Payer: Medicare Other | Admitting: Lab

## 2014-02-25 ENCOUNTER — Ambulatory Visit (HOSPITAL_COMMUNITY)
Admission: RE | Admit: 2014-02-25 | Discharge: 2014-02-25 | Disposition: A | Discharge status: Home / Self Care | Payer: Medicare Other | Source: Ambulatory Visit | Attending: Hematology & Oncology | Admitting: Hematology & Oncology

## 2014-02-25 ENCOUNTER — Ambulatory Visit (HOSPITAL_BASED_OUTPATIENT_CLINIC_OR_DEPARTMENT_OTHER): Payer: Medicare Other | Admitting: Hematology & Oncology

## 2014-02-25 VITALS — BP 129/73 | HR 78 | Temp 98.1°F | Resp 18 | Ht 65.0 in | Wt 132.0 lb

## 2014-02-25 DIAGNOSIS — D704 Cyclic neutropenia: Secondary | ICD-10-CM | POA: Insufficient documentation

## 2014-02-25 DIAGNOSIS — D591 Autoimmune hemolytic anemia, unspecified: Secondary | ICD-10-CM

## 2014-02-25 DIAGNOSIS — D5912 Cold autoimmune hemolytic anemia: Secondary | ICD-10-CM

## 2014-02-25 DIAGNOSIS — D649 Anemia, unspecified: Secondary | ICD-10-CM | POA: Insufficient documentation

## 2014-02-25 LAB — CBC WITH DIFFERENTIAL/PLATELET
BASOS ABS: 0 10*3/uL (ref 0.0–0.1)
Basophils Relative: 1 % (ref 0–1)
EOS ABS: 0.2 10*3/uL (ref 0.0–0.7)
EOS PCT: 2 % (ref 0–5)
HEMATOCRIT: 30.4 % — AB (ref 36.0–46.0)
Hemoglobin: 10.6 g/dL — ABNORMAL LOW (ref 12.0–15.0)
LYMPHS ABS: 1.4 10*3/uL (ref 0.7–4.0)
Lymphocytes Relative: 21 % (ref 12–46)
MCH: 33.5 pg (ref 26.0–34.0)
MCHC: 34.9 g/dL (ref 30.0–36.0)
MCV: 96.2 fL (ref 78.0–100.0)
Monocytes Absolute: 0.5 10*3/uL (ref 0.1–1.0)
Monocytes Relative: 8 % (ref 3–12)
Neutro Abs: 4.4 10*3/uL (ref 1.7–7.7)
Neutrophils Relative %: 68 % (ref 43–77)
PLATELETS: 251 10*3/uL (ref 150–400)
RBC: 3.16 MIL/uL — ABNORMAL LOW (ref 3.87–5.11)
RDW: 14.2 % (ref 11.5–15.5)
WBC: 6.4 10*3/uL (ref 4.0–10.5)

## 2014-02-25 LAB — BONE MARROW EXAM

## 2014-02-25 MED ORDER — MEPERIDINE HCL 50 MG/ML IJ SOLN
50.0000 mg | Freq: Once | INTRAMUSCULAR | Status: DC
Start: 2014-02-25 — End: 2014-02-26
  Filled 2014-02-25: qty 1

## 2014-02-25 MED ORDER — SODIUM CHLORIDE 0.9 % IV SOLN: Freq: Once | INTRAVENOUS | Status: DC

## 2014-02-25 MED ORDER — MIDAZOLAM HCL 10 MG/2ML IJ SOLN
5.0000 mg | Freq: Once | INTRAMUSCULAR | Status: DC
  Filled 2014-02-25: qty 2

## 2014-02-25 MED ORDER — MEPERIDINE HCL 25 MG/ML IJ SOLN
INTRAMUSCULAR | Status: AC | PRN
  Administered 2014-02-25: 12.5 mg via INTRAVENOUS

## 2014-02-25 MED ORDER — MIDAZOLAM HCL 5 MG/5ML IJ SOLN
INTRAMUSCULAR | Status: AC | PRN
  Administered 2014-02-25: 1 mg via INTRAVENOUS
  Administered 2014-02-25: 2.5 mg via INTRAVENOUS

## 2014-02-25 NOTE — Sedation Documentation (Signed)
Patient is resting comfortably. 

## 2014-02-25 NOTE — Sedation Documentation (Signed)
Dressing CDI 

## 2014-02-25 NOTE — Sedation Documentation (Signed)
Dressing remains C/D/I.

## 2014-02-25 NOTE — Sedation Documentation (Signed)
Family updated as to patient's status.

## 2014-02-25 NOTE — Sedation Documentation (Signed)
Procedure ends Dressing with gauze and hypafix to left post iliac area. Pt placed supine.

## 2014-02-25 NOTE — Discharge Instructions (Signed)
Do not drive  For 24 hours Do not go into public places today May resume your regular diet and take home medications as usual May experience small amount of tingling in leg (biopsy side) May take shower and remove bandage in am For any questions or concerns, call Dr Marin Olp [phone] If bleeding occurs at site, hold pressure x10 minutes  If continues, call Dr Marin Olp [phone]  Bone Marrow Aspiration and Bone Biopsy Examination of the bone marrow is a valuable test to diagnose blood disorders. A bone marrow biopsy takes a sample of bone and a small amount of fluid and cells from inside the bone. A bone marrow aspiration removes only the marrow. Bone marrow aspiration and bone biopsies are used to stage different disorders of the blood, such as leukemia. Staging will help your caregiver understand how far the disease has progressed.  The tests are also useful in diagnosing:  Fever of unknown origin (FUO).  Bacterial infections and other widespread fungal infections.  Cancers that have spread (metastasized) to the bone marrow.  Diseases that are characterized by a deficiency of an enzyme (storage diseases). This includes:  Niemann-Pick disease.  Gaucher disease. PROCEDURE  Sites used to get samples include:   Back of your hip bone (posterior iliac crest).  Both aspiration and biopsy.  Front of your hip bone (anterior iliac crest).  Both aspiration and biopsy.  Breastbone (sternum).  Aspiration from your breastbone (done only in adults). This method is rarely used. When you get a hip bone aspiration:  You are placed lying on your side with the upper knee brought up and flexed with the lower leg straight.  The site is prepared, cleaned with an antiseptic scrub, and draped. This keeps the biopsy area clean.  The skin and the area down to the lining of the bone (periosteum) are made numb with a local anesthetic.  The bone marrow aspiration needle is inserted. You will feel  pressure on your bone.  Once inside the marrow cavity, a sample of bone marrow is sucked out (aspirated) for pathology slides.  The material collected for bone marrow slides is processed immediately by a technologist.  The technician selects the marrow particles to make the slides for pathology.  The marrow aspiration needle is removed. Then pressure is applied to the site with gauze until bleeding has stopped. Following an aspiration, a bone marrow biopsy may be performed as well. The technique for this is very similar. A dressing is then applied.  RISKS AND COMPLICATIONS  The main complications of a bone marrow aspiration and biopsy include infection and bleeding.  Complications are uncommon. The procedure may not be performed in patients with bleeding tendencies.  A very rare complication from the procedure is injury to the heart during a breastbone (sternal) marrow aspiration. Only bone marrow aspirations are performed in this area.  Long-lasting pain at the site of the bone marrow aspiration and biopsy is uncommon. Your caregiver will let you know when you are to get your results and will discuss them with you. You may make an appointment with your caregiver to find out the results. Do not assume everything is normal if you have not heard from your caregiver or the medical facility. It is important for you to follow up on all of your test results. Document Released: 12/01/2004 Document Revised: 02/20/2012 Document Reviewed: 11/25/2008 [facility] Patient Information 2014 [place]. Moderate Sedation, Adult Moderate sedation is given to help you relax or even sleep through a  procedure. You may remain sleepy, be clumsy, or have poor balance for several hours following this procedure. Arrange for a responsible adult, family member, or friend to take you home. A responsible adult should stay with you for at least 24 hours or until the medicines have worn off.  Do not participate in any  activities where you could become injured for the next 24 hours, or until you feel normal again. Do not:  Drive.  Swim.  Ride a bicycle.  Operate heavy machinery.  Cook.  Use power tools.  Climb ladders.  Work at [employer].  Do not make important decisions or sign legal documents until you are improved.  Vomiting may occur if you eat too soon. When you can drink without vomiting, try water, juice, or soup. Try solid foods if you feel little or no nausea.  Only take over-the-counter or prescription medications for pain, discomfort, or fever as directed by your caregiver.If pain medications have been prescribed for you, ask your caregiver how soon it is safe to take them.  Make sure you and your family fully understands everything about the medication given to you. Make sure you understand what side effects may occur.  You should not drink alcohol, take sleeping pills, or medications that cause drowsiness for at least 24 hours.  If you smoke, do not smoke alone.  If you are feeling better, you may resume normal activities 24 hours after receiving sedation.  Keep all appointments as scheduled. Follow all instructions.  Ask questions if you do not understand. SEEK MEDICAL CARE IF:   Your skin is pale or bluish in color.  You continue to feel sick to your stomach (nauseous) or throw up (vomit).  Your pain is getting worse and not helped by medication.  You have bleeding or swelling.  You are still sleepy or feeling clumsy after 24 hours. SEEK IMMEDIATE MEDICAL CARE IF:   You develop a rash.  You have difficulty breathing.  You develop any type of allergic problem.  You have a fever. Document Released: 08/23/2001 Document Revised: 02/20/2012 Document Reviewed: 08/05/2013 [facility] Patient Information 2014 [place].

## 2014-02-25 NOTE — Sedation Documentation (Signed)
Medication dose calculated and verified for: VERSED 3.5 mg IV DEMEROL 25mg  IV

## 2014-02-25 NOTE — Sedation Documentation (Signed)
Ambulated in room with minimal assist and dressing CDI Pt ready for discharge

## 2014-02-27 NOTE — Progress Notes (Signed)
This is a procedure note for Christine [name]. We did a bone marrow biopsy and aspirate on her as a short stay Center at [facility].  She was brought to the [facility]. She had an IV placed peripherally.  We did the appropriate time out procedure.  Her ASA class was 1. Her Mallimpati score was 1.  We placed her on to her right side. She got 25 mg of Demerol and 2.5 mg of Versed, IV, for sedation.  The left posterior iliac crest  was prepped and draped in sterile fashion. 5 cc of lidocaine was infiltrated under the skin down to the periosteum. We used a Jamshidi biopsy needle and a get an aspirate. The aspirate was sent off for flow cytometry and cytogenetics.  We then obtained a good biopsy core through the same biopsy needle.  She tolerated the procedure well. We cleaned and dressed the procedure site sterilely.  There were no complications.

## 2014-03-03 LAB — TISSUE HYBRIDIZATION (BONE MARROW)-NCBH

## 2014-03-03 LAB — CHROMOSOME ANALYSIS, BONE MARROW

## 2014-03-07 ENCOUNTER — Encounter: Payer: Self-pay | Admitting: Hematology & Oncology

## 2014-03-10 ENCOUNTER — Other Ambulatory Visit: Payer: Self-pay | Admitting: Nurse Practitioner

## 2014-03-10 ENCOUNTER — Encounter (HOSPITAL_COMMUNITY): Payer: Self-pay

## 2014-03-10 DIAGNOSIS — D5912 Cold autoimmune hemolytic anemia: Secondary | ICD-10-CM

## 2014-03-10 DIAGNOSIS — D591 Other autoimmune hemolytic anemias: Principal | ICD-10-CM

## 2014-03-11 ENCOUNTER — Ambulatory Visit (HOSPITAL_BASED_OUTPATIENT_CLINIC_OR_DEPARTMENT_OTHER): Payer: Medicare Other | Admitting: Hematology & Oncology

## 2014-03-11 ENCOUNTER — Other Ambulatory Visit (HOSPITAL_BASED_OUTPATIENT_CLINIC_OR_DEPARTMENT_OTHER): Payer: Medicare Other | Admitting: Lab

## 2014-03-11 VITALS — BP 160/90 | HR 67 | Temp 96.8°F | Wt 137.0 lb

## 2014-03-11 DIAGNOSIS — D5912 Cold autoimmune hemolytic anemia: Secondary | ICD-10-CM

## 2014-03-11 DIAGNOSIS — D596 Hemoglobinuria due to hemolysis from other external causes: Secondary | ICD-10-CM

## 2014-03-11 DIAGNOSIS — D591 Autoimmune hemolytic anemia, unspecified: Secondary | ICD-10-CM

## 2014-03-11 LAB — CBC WITH DIFFERENTIAL (CANCER CENTER ONLY)
BASO#: 0.1 10*3/uL (ref 0.0–0.2)
BASO%: 1.3 % (ref 0.0–2.0)
EOS ABS: 0.1 10*3/uL (ref 0.0–0.5)
EOS%: 2.3 % (ref 0.0–7.0)
HEMATOCRIT: 26.7 % — AB (ref 34.8–46.6)
HGB: 9.7 g/dL — ABNORMAL LOW (ref 11.6–15.9)
LYMPH#: 0.9 10*3/uL (ref 0.9–3.3)
LYMPH%: 22.6 % (ref 14.0–48.0)
MCH: 36.9 pg — ABNORMAL HIGH (ref 26.0–34.0)
MCHC: 36.3 g/dL — ABNORMAL HIGH (ref 32.0–36.0)
MCV: 102 fL — AB (ref 81–101)
MONO#: 0.3 10*3/uL (ref 0.1–0.9)
MONO%: 8.8 % (ref 0.0–13.0)
NEUT#: 2.5 10*3/uL (ref 1.5–6.5)
NEUT%: 65 % (ref 39.6–80.0)
PLATELETS: 318 10*3/uL (ref 145–400)
RBC: 2.63 10*6/uL — AB (ref 3.70–5.32)
RDW: 14.9 % (ref 11.1–15.7)
WBC: 3.9 10*3/uL (ref 3.9–10.0)

## 2014-03-11 NOTE — Patient Instructions (Signed)

## 2014-03-11 NOTE — Progress Notes (Signed)
Hematology and Oncology Follow Up Visit  Christine [name] [id number] 01/07/1946 68 y.o. 03/11/2014   Principle Diagnosis:   Cold agglutinin disease  Lymphoproliferative disorder    Current Therapy:    Folic acid 2 mg by mouth daily     Interim History:  Ms.  [name] is back for followup. We went ahead and did a workup for the cold agglutinin disease. We initially did flow cytometry on peripheral blood. This did show a monoclonal population of lymphocytes.  Based on this, and we will ahead and did a CT scan saw her. The CT scans did not show any evidence of obvious and follow. There is no splenomegaly.  We then went ahead a bone marrow biopsy on her. The report is below: .  She feels okay. She feels tired. She is on folic acid. She's doing well with folic acid.  The bone marrow aspirate that was done did have a monoclonal population of cells.  Cytogenetics were done. These were negative. FISH studies were negative. Medications: Current outpatient prescriptions:Ascorbic Acid (VITAMIN C) 1000 MG tablet, Take 1,000 mg by mouth daily., Disp: , Rfl: ;  buPROPion (WELLBUTRIN XL) 300 MG 24 hr tablet, Take 300 mg by mouth every morning. , Disp: , Rfl: ;  Cholecalciferol (VITAMIN D3) 2000 UNITS TABS, Take 1 tablet by mouth daily., Disp: , Rfl:  cyclobenzaprine (FLEXERIL) 10 MG tablet, Take 1 tablet (10 mg total) by mouth 3 (three) times daily as needed for muscle spasms., Disp: 20 tablet, Rfl: 0;  docusate sodium (COLACE) 100 MG capsule, Take 1 capsule (100 mg total) by mouth 2 (two) times daily as needed for mild constipation (while taking pain medication)., Disp: 30 capsule, Rfl: 0;  escitalopram (LEXAPRO) 10 MG tablet, Take 10 mg by mouth every morning. , Disp: , Rfl:  folic acid (FOLVITE) 1 MG tablet, Take 2 tablets (2 mg total) by mouth daily., Disp: 60 tablet, Rfl: 12;  losartan-hydrochlorothiazide (HYZAAR) 100-25 MG per tablet, Take 1 tablet by mouth every morning., Disp: , Rfl: ;  LYSINE  PO, Take 1 tablet by mouth daily., Disp: , Rfl: ;  magnesium gluconate (MAGONATE) 500 MG tablet, Take 500 mg by mouth daily., Disp: , Rfl:  oxyCODONE-acetaminophen (PERCOCET/ROXICET) 5-325 MG per tablet, Take 1-2 tablets by mouth every 4 (four) hours as needed for severe pain., Disp: 30 tablet, Rfl: 0;  vitamin B-12 (CYANOCOBALAMIN) 1000 MCG tablet, Take 1,000 mcg by mouth daily., Disp: , Rfl:   Allergies:  Allergies  Allergen Reactions  . Bee Venom Shortness Of Breath    Past Medical History, Surgical history, Social history, and Family History were reviewed and updated.  Review of Systems: As above  Physical Exam:  weight is 137 lb (62.143 kg). Her oral temperature is 96.8 F (36 C). Her blood pressure is 160/90 and her pulse is 67.   Well-developed well-nourished female. She has no adenopathy. There is no hepatosplenomegaly. She has no rashes. Lungs are clear. Cardiac exam regular in rhythm. Back extremities shows no clubbing cyanosis or edema. Has good range of motion of her joints. Neurological exam shows no focal neurological deficits.exam no tenderness over the spine ribs or hips. exam shows no rashes ecchymosis or petechia.   Lab Results  Component Value Date   WBC 3.9 03/11/2014   HGB 9.7* 03/11/2014   HCT 26.7* 03/11/2014   MCV 102* 03/11/2014   PLT 318 03/11/2014     Chemistry      Component Value Date/Time   NA 135*  02/23/2014 1648   NA 133 02/13/2014 1108   K 3.8 02/23/2014 1648   K 4.9* 02/13/2014 1108   CL 96 02/23/2014 1648   CL 97* 02/13/2014 1108   CO2 32 02/13/2014 1108   CO2 26 09/17/2013 1425   BUN 14 02/23/2014 1648   BUN 13 02/13/2014 1108   CREATININE 0.70 02/23/2014 1648   CREATININE 0.4* 02/13/2014 1108      Component Value Date/Time   CALCIUM 9.3 02/13/2014 1108   CALCIUM 9.5 09/17/2013 1425   ALKPHOS 63 02/13/2014 1108   ALKPHOS 66 01/27/2014 1522   AST 17 02/13/2014 1108   AST 22 01/27/2014 1522   ALT 15 02/13/2014 1108   ALT 18 01/27/2014 1522   BILITOT 2.30* 02/13/2014  1108   BILITOT 1.8* 01/27/2014 1522         Impression and Plan: Christine [name] is  a 68 year old female. His cold agglutinin disease. When we last checked her cold agglutinin titer it was over 1:20,000.  I talked to her and her husband I told him that I just do not think that this was going to go away on it's own. I do believe that she does have a low-grade lymphoproliferative process. I think the flow cytometry confirms this. I did a bone marrow biopsy also is highly suggestive of this. If this is all clinically correlated together.  I taught her asthma treatment options. I think that Rituxan would be appropriate and that it would be recommended. She does feel tired. I think that we would have a good chance of helping her with Rituxan in eradicating orderlies significantly lower in the cold agglutinin titer level.  I spent about 50 minutes or so with her husband. I explained to them how Rituxan works. I gave her information about Rituxan.  I gave them a protocol that we use. Rituxan is given weekly. I went over the toxicities. I told him that with the first treatment, and this is when toxicity would have been.  I told him that I thought we had about a 60% chance of significantly decreasing the cold agglutinin titer.  She wants to get started. I think we can get started next week.  We will check her blood weekly. Parabolic plan to see her back probably 2 weeks after she completes the Rituxan.Volanda Napoleon, MD 3/31/20153:20 PM

## 2014-03-15 LAB — COLD AGGLUTININ TITER

## 2014-03-15 LAB — CMP AND LIVER
ALBUMIN: 4.2 g/dL (ref 3.5–5.2)
ALT: 33 U/L (ref 0–35)
AST: 20 U/L (ref 0–37)
Alkaline Phosphatase: 102 U/L (ref 39–117)
BILIRUBIN DIRECT: 0.4 mg/dL — AB (ref 0.0–0.3)
BUN: 13 mg/dL (ref 6–23)
CALCIUM: 9.2 mg/dL (ref 8.4–10.5)
CHLORIDE: 96 meq/L (ref 96–112)
CO2: 23 meq/L (ref 19–32)
Creatinine, Ser: 0.64 mg/dL (ref 0.50–1.10)
Glucose, Bld: 91 mg/dL (ref 70–99)
Indirect Bilirubin: 1.6 mg/dL — ABNORMAL HIGH (ref 0.2–1.2)
POTASSIUM: 4.3 meq/L (ref 3.5–5.3)
SODIUM: 131 meq/L — AB (ref 135–145)
TOTAL PROTEIN: 6.4 g/dL (ref 6.0–8.3)
Total Bilirubin: 2 mg/dL — ABNORMAL HIGH (ref 0.2–1.2)

## 2014-03-15 LAB — RETICULOCYTES (CHCC)
ABS Retic: 155.5 10*3/uL (ref 19.0–186.0)
RBC.: 2.88 MIL/uL — ABNORMAL LOW (ref 3.87–5.11)
Retic Ct Pct: 5.4 % — ABNORMAL HIGH (ref 0.4–2.3)

## 2014-03-15 LAB — HAPTOGLOBIN

## 2014-03-19 ENCOUNTER — Encounter: Payer: Self-pay | Admitting: Hematology & Oncology

## 2014-03-19 ENCOUNTER — Other Ambulatory Visit (HOSPITAL_BASED_OUTPATIENT_CLINIC_OR_DEPARTMENT_OTHER): Payer: Medicare Other | Admitting: Lab

## 2014-03-19 ENCOUNTER — Ambulatory Visit (HOSPITAL_BASED_OUTPATIENT_CLINIC_OR_DEPARTMENT_OTHER): Payer: Medicare Other

## 2014-03-19 ENCOUNTER — Other Ambulatory Visit: Payer: Self-pay | Admitting: Lab

## 2014-03-19 ENCOUNTER — Ambulatory Visit: Payer: Medicare Other

## 2014-03-19 VITALS — BP 119/74 | HR 79 | Temp 96.9°F | Resp 16 | Ht 65.5 in | Wt 133.0 lb

## 2014-03-19 DIAGNOSIS — D596 Hemoglobinuria due to hemolysis from other external causes: Secondary | ICD-10-CM

## 2014-03-19 DIAGNOSIS — Z79899 Other long term (current) drug therapy: Secondary | ICD-10-CM

## 2014-03-19 DIAGNOSIS — Z79620 Long term (current) use of immunosuppressive biologic: Secondary | ICD-10-CM

## 2014-03-19 DIAGNOSIS — D649 Anemia, unspecified: Secondary | ICD-10-CM

## 2014-03-19 DIAGNOSIS — D591 Autoimmune hemolytic anemia, unspecified: Secondary | ICD-10-CM

## 2014-03-19 DIAGNOSIS — K8020 Calculus of gallbladder without cholecystitis without obstruction: Secondary | ICD-10-CM

## 2014-03-19 DIAGNOSIS — R17 Unspecified jaundice: Secondary | ICD-10-CM

## 2014-03-19 DIAGNOSIS — Z5112 Encounter for antineoplastic immunotherapy: Secondary | ICD-10-CM

## 2014-03-19 DIAGNOSIS — D5912 Cold autoimmune hemolytic anemia: Secondary | ICD-10-CM

## 2014-03-19 DIAGNOSIS — I10 Essential (primary) hypertension: Secondary | ICD-10-CM

## 2014-03-19 LAB — CBC WITH DIFFERENTIAL (CANCER CENTER ONLY)
BASO#: 0 10*3/uL (ref 0.0–0.2)
BASO%: 0.9 % (ref 0.0–2.0)
EOS ABS: 0.1 10*3/uL (ref 0.0–0.5)
EOS%: 2.5 % (ref 0.0–7.0)
HCT: UNDETERMINED % (ref 34.8–46.6)
HGB: 9.8 g/dL — ABNORMAL LOW (ref 11.6–15.9)
LYMPH#: 1.2 10*3/uL (ref 0.9–3.3)
LYMPH%: 26.4 % (ref 14.0–48.0)
MCH: UNDETERMINED pg (ref 26.0–34.0)
MCHC: UNDETERMINED g/dL (ref 32.0–36.0)
MCV: UNDETERMINED fL (ref 81–101)
MONO#: 0.4 10*3/uL (ref 0.1–0.9)
MONO%: 8.5 % (ref 0.0–13.0)
NEUT%: 61.7 % (ref 39.6–80.0)
NEUTROS ABS: 2.7 10*3/uL (ref 1.5–6.5)
PLATELETS: 348 10*3/uL (ref 145–400)
RBC: UNDETERMINED 10*6/uL (ref 3.70–5.32)
WBC: 4.4 10*3/uL (ref 3.9–10.0)

## 2014-03-19 LAB — HEPATITIS PANEL, ACUTE
HCV Ab: NEGATIVE
HEP A IGM: NONREACTIVE
HEP B C IGM: NONREACTIVE
Hepatitis B Surface Ag: NEGATIVE

## 2014-03-19 MED ORDER — ACETAMINOPHEN 325 MG PO TABS
ORAL_TABLET | ORAL | Status: AC
  Filled 2014-03-19: qty 2

## 2014-03-19 MED ORDER — SODIUM CHLORIDE 0.9 % IV SOLN
375.0000 mg/m2 | Freq: Once | INTRAVENOUS | Status: AC
  Administered 2014-03-19: 600 mg via INTRAVENOUS
  Filled 2014-03-19: qty 60

## 2014-03-19 MED ORDER — DIPHENHYDRAMINE HCL 25 MG PO CAPS
50.0000 mg | ORAL_CAPSULE | Freq: Once | ORAL | Status: AC
  Administered 2014-03-19: 50 mg via ORAL

## 2014-03-19 MED ORDER — ACETAMINOPHEN 325 MG PO TABS
650.0000 mg | ORAL_TABLET | Freq: Once | ORAL | Status: AC
  Administered 2014-03-19: 650 mg via ORAL

## 2014-03-19 MED ORDER — DIPHENHYDRAMINE HCL 25 MG PO CAPS
ORAL_CAPSULE | ORAL | Status: AC
  Filled 2014-03-19: qty 2

## 2014-03-19 MED ORDER — METHYLPREDNISOLONE SODIUM SUCC 125 MG IJ SOLR
125.0000 mg | Freq: Once | INTRAMUSCULAR | Status: AC
  Administered 2014-03-19: 125 mg via INTRAVENOUS

## 2014-03-19 MED ORDER — METHYLPREDNISOLONE SODIUM SUCC 125 MG IJ SOLR
INTRAMUSCULAR | Status: AC
  Filled 2014-03-19: qty 2

## 2014-03-19 NOTE — Patient Instructions (Signed)

## 2014-03-26 ENCOUNTER — Ambulatory Visit (HOSPITAL_BASED_OUTPATIENT_CLINIC_OR_DEPARTMENT_OTHER): Payer: Medicare Other

## 2014-03-26 ENCOUNTER — Other Ambulatory Visit (HOSPITAL_BASED_OUTPATIENT_CLINIC_OR_DEPARTMENT_OTHER): Payer: Medicare Other | Admitting: Lab

## 2014-03-26 VITALS — BP 115/73 | HR 69 | Temp 97.0°F | Resp 16

## 2014-03-26 DIAGNOSIS — D591 Autoimmune hemolytic anemia, unspecified: Secondary | ICD-10-CM

## 2014-03-26 DIAGNOSIS — D596 Hemoglobinuria due to hemolysis from other external causes: Secondary | ICD-10-CM

## 2014-03-26 DIAGNOSIS — D5912 Cold autoimmune hemolytic anemia: Secondary | ICD-10-CM

## 2014-03-26 DIAGNOSIS — Z5112 Encounter for antineoplastic immunotherapy: Secondary | ICD-10-CM

## 2014-03-26 LAB — CBC WITH DIFFERENTIAL (CANCER CENTER ONLY)
BASO#: 0 10*3/uL (ref 0.0–0.2)
BASO%: 0.8 % (ref 0.0–2.0)
EOS ABS: 0.1 10*3/uL (ref 0.0–0.5)
EOS%: 3.1 % (ref 0.0–7.0)
HCT: 30.1 % — ABNORMAL LOW (ref 34.8–46.6)
HEMOGLOBIN: 10.7 g/dL — AB (ref 11.6–15.9)
LYMPH#: 0.9 10*3/uL (ref 0.9–3.3)
LYMPH%: 22.7 % (ref 14.0–48.0)
MCH: 34.5 pg — ABNORMAL HIGH (ref 26.0–34.0)
MCHC: 35.5 g/dL (ref 32.0–36.0)
MCV: 97 fL (ref 81–101)
MONO#: 0.3 10*3/uL (ref 0.1–0.9)
MONO%: 7.6 % (ref 0.0–13.0)
NEUT%: 65.8 % (ref 39.6–80.0)
NEUTROS ABS: 2.5 10*3/uL (ref 1.5–6.5)
PLATELETS: 303 10*3/uL (ref 145–400)
RBC: 3.1 10*6/uL — AB (ref 3.70–5.32)
RDW: 14.9 % (ref 11.1–15.7)
WBC: 3.8 10*3/uL — AB (ref 3.9–10.0)

## 2014-03-26 LAB — TECHNOLOGIST REVIEW CHCC SATELLITE

## 2014-03-26 MED ORDER — DIPHENHYDRAMINE HCL 25 MG PO CAPS
50.0000 mg | ORAL_CAPSULE | Freq: Once | ORAL | Status: AC
  Administered 2014-03-26: 50 mg via ORAL

## 2014-03-26 MED ORDER — METHYLPREDNISOLONE SODIUM SUCC 125 MG IJ SOLR
125.0000 mg | Freq: Once | INTRAMUSCULAR | Status: AC
  Administered 2014-03-26: 125 mg via INTRAVENOUS

## 2014-03-26 MED ORDER — ACETAMINOPHEN 325 MG PO TABS
650.0000 mg | ORAL_TABLET | Freq: Once | ORAL | Status: AC
  Administered 2014-03-26: 650 mg via ORAL

## 2014-03-26 MED ORDER — DIPHENHYDRAMINE HCL 25 MG PO CAPS
ORAL_CAPSULE | ORAL | Status: AC
  Filled 2014-03-26: qty 2

## 2014-03-26 MED ORDER — ACETAMINOPHEN 325 MG PO TABS
ORAL_TABLET | ORAL | Status: AC
  Filled 2014-03-26: qty 2

## 2014-03-26 MED ORDER — METHYLPREDNISOLONE SODIUM SUCC 125 MG IJ SOLR
INTRAMUSCULAR | Status: AC
  Filled 2014-03-26: qty 2

## 2014-03-26 MED ORDER — SODIUM CHLORIDE 0.9 % IV SOLN
375.0000 mg/m2 | Freq: Once | INTRAVENOUS | Status: AC
  Administered 2014-03-26: 600 mg via INTRAVENOUS
  Filled 2014-03-26: qty 60

## 2014-03-26 MED ORDER — SODIUM CHLORIDE 0.9 % IV SOLN
Freq: Once | INTRAVENOUS | Status: AC
  Administered 2014-03-26: 11:00:00 via INTRAVENOUS

## 2014-03-26 NOTE — Patient Instructions (Signed)

## 2014-03-27 ENCOUNTER — Other Ambulatory Visit: Payer: Self-pay | Admitting: *Deleted

## 2014-03-27 MED ORDER — PROCHLORPERAZINE MALEATE 5 MG PO TABS: 5.0000 mg | ORAL_TABLET | Freq: Four times a day (QID) | ORAL | Status: DC | PRN

## 2014-04-02 ENCOUNTER — Other Ambulatory Visit (HOSPITAL_BASED_OUTPATIENT_CLINIC_OR_DEPARTMENT_OTHER): Payer: Medicare Other | Admitting: Lab

## 2014-04-02 ENCOUNTER — Ambulatory Visit (HOSPITAL_BASED_OUTPATIENT_CLINIC_OR_DEPARTMENT_OTHER): Payer: Medicare Other

## 2014-04-02 VITALS — BP 118/69 | HR 75 | Temp 97.5°F | Resp 16

## 2014-04-02 DIAGNOSIS — D5912 Cold autoimmune hemolytic anemia: Secondary | ICD-10-CM

## 2014-04-02 DIAGNOSIS — D591 Autoimmune hemolytic anemia, unspecified: Secondary | ICD-10-CM

## 2014-04-02 DIAGNOSIS — D596 Hemoglobinuria due to hemolysis from other external causes: Secondary | ICD-10-CM

## 2014-04-02 LAB — CBC WITH DIFFERENTIAL (CANCER CENTER ONLY)
BASO#: 0.1 10*3/uL (ref 0.0–0.2)
BASO%: 1.4 % (ref 0.0–2.0)
EOS%: 2.5 % (ref 0.0–7.0)
Eosinophils Absolute: 0.1 10*3/uL (ref 0.0–0.5)
HEMATOCRIT: 27.7 % — AB (ref 34.8–46.6)
HGB: 10.5 g/dL — ABNORMAL LOW (ref 11.6–15.9)
LYMPH#: 0.6 10*3/uL — ABNORMAL LOW (ref 0.9–3.3)
LYMPH%: 14 % (ref 14.0–48.0)
MCH: 38.5 pg — ABNORMAL HIGH (ref 26.0–34.0)
MCHC: 37.9 g/dL — ABNORMAL HIGH (ref 32.0–36.0)
MCV: 102 fL — AB (ref 81–101)
MONO#: 0.3 10*3/uL (ref 0.1–0.9)
MONO%: 6.8 % (ref 0.0–13.0)
NEUT#: 3.3 10*3/uL (ref 1.5–6.5)
NEUT%: 75.3 % (ref 39.6–80.0)
Platelets: 297 10*3/uL (ref 145–400)
RBC: 2.73 10*6/uL — AB (ref 3.70–5.32)
RDW: 17.2 % — ABNORMAL HIGH (ref 11.1–15.7)
WBC: 4.4 10*3/uL (ref 3.9–10.0)

## 2014-04-02 MED ORDER — METHYLPREDNISOLONE SODIUM SUCC 125 MG IJ SOLR
125.0000 mg | Freq: Once | INTRAMUSCULAR | Status: AC
  Administered 2014-04-02: 125 mg via INTRAVENOUS

## 2014-04-02 MED ORDER — METHYLPREDNISOLONE SODIUM SUCC 125 MG IJ SOLR
INTRAMUSCULAR | Status: AC
  Filled 2014-04-02: qty 2

## 2014-04-02 MED ORDER — DIPHENHYDRAMINE HCL 25 MG PO CAPS
50.0000 mg | ORAL_CAPSULE | Freq: Once | ORAL | Status: AC
  Administered 2014-04-02: 50 mg via ORAL

## 2014-04-02 MED ORDER — ACETAMINOPHEN 325 MG PO TABS
ORAL_TABLET | ORAL | Status: AC
  Filled 2014-04-02: qty 2

## 2014-04-02 MED ORDER — SODIUM CHLORIDE 0.9 % IV SOLN
375.0000 mg/m2 | Freq: Once | INTRAVENOUS | Status: AC
  Administered 2014-04-02: 600 mg via INTRAVENOUS
  Filled 2014-04-02: qty 60

## 2014-04-02 MED ORDER — DIPHENHYDRAMINE HCL 25 MG PO CAPS
ORAL_CAPSULE | ORAL | Status: AC
  Filled 2014-04-02: qty 2

## 2014-04-02 MED ORDER — SODIUM CHLORIDE 0.9 % IV SOLN
Freq: Once | INTRAVENOUS | Status: AC
  Administered 2014-04-02: 11:00:00 via INTRAVENOUS

## 2014-04-02 MED ORDER — ACETAMINOPHEN 325 MG PO TABS
650.0000 mg | ORAL_TABLET | Freq: Once | ORAL | Status: AC
  Administered 2014-04-02: 650 mg via ORAL

## 2014-04-02 NOTE — Patient Instructions (Signed)

## 2014-04-04 ENCOUNTER — Encounter: Payer: Self-pay | Admitting: Hematology & Oncology

## 2014-04-08 ENCOUNTER — Encounter: Payer: Self-pay | Admitting: Hematology & Oncology

## 2014-04-09 ENCOUNTER — Ambulatory Visit (HOSPITAL_BASED_OUTPATIENT_CLINIC_OR_DEPARTMENT_OTHER): Payer: Medicare Other

## 2014-04-09 ENCOUNTER — Other Ambulatory Visit (HOSPITAL_BASED_OUTPATIENT_CLINIC_OR_DEPARTMENT_OTHER): Payer: Medicare Other | Admitting: Lab

## 2014-04-09 VITALS — BP 121/72 | HR 78 | Temp 97.0°F | Resp 18

## 2014-04-09 DIAGNOSIS — D591 Autoimmune hemolytic anemia, unspecified: Secondary | ICD-10-CM

## 2014-04-09 DIAGNOSIS — D5912 Cold autoimmune hemolytic anemia: Secondary | ICD-10-CM

## 2014-04-09 DIAGNOSIS — Z5112 Encounter for antineoplastic immunotherapy: Secondary | ICD-10-CM

## 2014-04-09 LAB — CBC WITH DIFFERENTIAL (CANCER CENTER ONLY)
BASO#: 0 10*3/uL (ref 0.0–0.2)
BASO%: 0.9 % (ref 0.0–2.0)
EOS%: 3.7 % (ref 0.0–7.0)
Eosinophils Absolute: 0.2 10*3/uL (ref 0.0–0.5)
HCT: 30.3 % — ABNORMAL LOW (ref 34.8–46.6)
HGB: 11.2 g/dL — ABNORMAL LOW (ref 11.6–15.9)
LYMPH#: 1 10*3/uL (ref 0.9–3.3)
LYMPH%: 23.3 % (ref 14.0–48.0)
MCH: 37.6 pg — ABNORMAL HIGH (ref 26.0–34.0)
MCHC: 37 g/dL — ABNORMAL HIGH (ref 32.0–36.0)
MCV: 102 fL — ABNORMAL HIGH (ref 81–101)
MONO#: 0.4 10*3/uL (ref 0.1–0.9)
MONO%: 8.5 % (ref 0.0–13.0)
NEUT%: 63.6 % (ref 39.6–80.0)
NEUTROS ABS: 2.8 10*3/uL (ref 1.5–6.5)
PLATELETS: 302 10*3/uL (ref 145–400)
RBC: 2.98 10*6/uL — ABNORMAL LOW (ref 3.70–5.32)
RDW: 17.3 % — AB (ref 11.1–15.7)
WBC: 4.4 10*3/uL (ref 3.9–10.0)

## 2014-04-09 LAB — CHCC SATELLITE - SMEAR

## 2014-04-09 MED ORDER — SODIUM CHLORIDE 0.9 % IJ SOLN
10.0000 mL | INTRAMUSCULAR | Status: DC | PRN
  Filled 2014-04-09: qty 10

## 2014-04-09 MED ORDER — HEPARIN SOD (PORK) LOCK FLUSH 100 UNIT/ML IV SOLN
500.0000 [IU] | Freq: Once | INTRAVENOUS | Status: DC | PRN
  Filled 2014-04-09: qty 5

## 2014-04-09 MED ORDER — METHYLPREDNISOLONE SODIUM SUCC 125 MG IJ SOLR
INTRAMUSCULAR | Status: AC
  Filled 2014-04-09: qty 2

## 2014-04-09 MED ORDER — HEPARIN SOD (PORK) LOCK FLUSH 100 UNIT/ML IV SOLN
250.0000 [IU] | Freq: Once | INTRAVENOUS | Status: DC | PRN
  Filled 2014-04-09: qty 5

## 2014-04-09 MED ORDER — ACETAMINOPHEN 325 MG PO TABS
650.0000 mg | ORAL_TABLET | Freq: Once | ORAL | Status: AC
  Administered 2014-04-09: 650 mg via ORAL

## 2014-04-09 MED ORDER — DIPHENHYDRAMINE HCL 25 MG PO CAPS
ORAL_CAPSULE | ORAL | Status: AC
  Filled 2014-04-09: qty 2

## 2014-04-09 MED ORDER — SODIUM CHLORIDE 0.9 % IV SOLN
375.0000 mg/m2 | Freq: Once | INTRAVENOUS | Status: AC
  Administered 2014-04-09: 600 mg via INTRAVENOUS
  Filled 2014-04-09: qty 60

## 2014-04-09 MED ORDER — METHYLPREDNISOLONE SODIUM SUCC 125 MG IJ SOLR
125.0000 mg | Freq: Once | INTRAMUSCULAR | Status: AC
  Administered 2014-04-09: 125 mg via INTRAVENOUS

## 2014-04-09 MED ORDER — DIPHENHYDRAMINE HCL 25 MG PO CAPS
50.0000 mg | ORAL_CAPSULE | Freq: Once | ORAL | Status: AC
  Administered 2014-04-09: 50 mg via ORAL

## 2014-04-09 MED ORDER — SODIUM CHLORIDE 0.9 % IV SOLN
Freq: Once | INTRAVENOUS | Status: AC
  Administered 2014-04-09: 10:00:00 via INTRAVENOUS

## 2014-04-09 MED ORDER — ALTEPLASE 2 MG IJ SOLR
2.0000 mg | Freq: Once | INTRAMUSCULAR | Status: DC | PRN
  Filled 2014-04-09: qty 2

## 2014-04-09 MED ORDER — SODIUM CHLORIDE 0.9 % IJ SOLN
3.0000 mL | INTRAMUSCULAR | Status: DC | PRN
  Filled 2014-04-09: qty 10

## 2014-04-09 MED ORDER — ACETAMINOPHEN 325 MG PO TABS
ORAL_TABLET | ORAL | Status: AC
  Filled 2014-04-09: qty 2

## 2014-04-09 NOTE — Patient Instructions (Signed)

## 2014-04-14 LAB — LACTATE DEHYDROGENASE: LDH: 195 U/L (ref 94–250)

## 2014-04-14 LAB — HAPTOGLOBIN: Haptoglobin: <25 mg/dL — ABNORMAL LOW (ref 45–215)

## 2014-04-14 LAB — COLD AGGLUTININ TITER

## 2014-04-23 ENCOUNTER — Ambulatory Visit (HOSPITAL_BASED_OUTPATIENT_CLINIC_OR_DEPARTMENT_OTHER): Payer: Medicare Other | Admitting: Hematology & Oncology

## 2014-04-23 ENCOUNTER — Other Ambulatory Visit (HOSPITAL_BASED_OUTPATIENT_CLINIC_OR_DEPARTMENT_OTHER): Payer: Medicare Other | Admitting: Lab

## 2014-04-23 VITALS — BP 138/74 | HR 67 | Temp 97.8°F | Resp 14

## 2014-04-23 DIAGNOSIS — D591 Autoimmune hemolytic anemia, unspecified: Secondary | ICD-10-CM

## 2014-04-23 DIAGNOSIS — D5912 Cold autoimmune hemolytic anemia: Secondary | ICD-10-CM

## 2014-04-23 LAB — CBC WITH DIFFERENTIAL (CANCER CENTER ONLY)
BASO#: 0.1 10*3/uL (ref 0.0–0.2)
BASO%: 1.3 % (ref 0.0–2.0)
EOS%: 3.1 % (ref 0.0–7.0)
Eosinophils Absolute: 0.1 10*3/uL (ref 0.0–0.5)
HEMATOCRIT: 29.8 % — AB (ref 34.8–46.6)
HEMOGLOBIN: 10.7 g/dL — AB (ref 11.6–15.9)
LYMPH#: 0.7 10*3/uL — ABNORMAL LOW (ref 0.9–3.3)
LYMPH%: 16.9 % (ref 14.0–48.0)
MCH: 35.5 pg — ABNORMAL HIGH (ref 26.0–34.0)
MCHC: 35.9 g/dL (ref 32.0–36.0)
MCV: 99 fL (ref 81–101)
MONO#: 0.4 10*3/uL (ref 0.1–0.9)
MONO%: 10 % (ref 0.0–13.0)
NEUT#: 2.7 10*3/uL (ref 1.5–6.5)
NEUT%: 68.7 % (ref 39.6–80.0)
Platelets: 277 10*3/uL (ref 145–400)
RBC: 3.01 10*6/uL — ABNORMAL LOW (ref 3.70–5.32)
RDW: 14.6 % (ref 11.1–15.7)
WBC: 3.9 10*3/uL (ref 3.9–10.0)

## 2014-04-23 LAB — CHCC SATELLITE - SMEAR

## 2014-04-23 NOTE — Progress Notes (Signed)
Hematology and Oncology Follow Up Visit  Christine [name] [id number] 08/04/1946 68 y.o. 04/23/2014   Principle Diagnosis:   Cold agglutinin disease  Lymphoproliferative disorder  Current Therapy:    Status post 1 four-week cycle of Rituxan  Folic acid 1 mg by mouth daily     Interim History:  Ms.  [name] is back for followup. We got her to the first cycle of Rituxan. She tolerated this well. She had no complications from this. She completed this back in late April. I still think that the. The full effect of Rituxan probably will not be seen for another month or so.  Her hemoglobin has come up nicely. When we last saw her, her hemoglobin was 1.2. We first saw her her hemoglobin was 8.3.  Her cold agglutinin titer initially was over 20,000. In April it was down to 2560.  She's had no palpable lymph glands. She's had no fatigue. She's had no rashes. She's had no abdominal pain. She's had no change in bowel or bladder habits.    Medications: Current outpatient prescriptions:Ascorbic Acid (VITAMIN C) 1000 MG tablet, Take 1,000 mg by mouth daily., Disp: , Rfl: ;  buPROPion (WELLBUTRIN XL) 300 MG 24 hr tablet, Take 300 mg by mouth every morning. , Disp: , Rfl: ;  Cholecalciferol (VITAMIN D3) 2000 UNITS TABS, Take 1 tablet by mouth daily., Disp: , Rfl:  cyclobenzaprine (FLEXERIL) 10 MG tablet, Take 1 tablet (10 mg total) by mouth 3 (three) times daily as needed for muscle spasms., Disp: 20 tablet, Rfl: 0;  diphenhydrAMINE (BENADRYL) 25 MG tablet, Take 25 mg by mouth every 6 (six) hours as needed., Disp: , Rfl:  docusate sodium (COLACE) 100 MG capsule, Take 1 capsule (100 mg total) by mouth 2 (two) times daily as needed for mild constipation (while taking pain medication)., Disp: 30 capsule, Rfl: 0;  escitalopram (LEXAPRO) 10 MG tablet, Take 10 mg by mouth every morning. , Disp: , Rfl: ;  folic acid (FOLVITE) 1 MG tablet, Take 2 tablets (2 mg total) by mouth daily., Disp: 60 tablet, Rfl:  12 losartan-hydrochlorothiazide (HYZAAR) 100-25 MG per tablet, Take 1 tablet by mouth every morning., Disp: , Rfl: ;  LYSINE PO, Take 1 tablet by mouth daily., Disp: , Rfl: ;  magnesium gluconate (MAGONATE) 500 MG tablet, Take 500 mg by mouth daily., Disp: , Rfl: ;  oxyCODONE-acetaminophen (PERCOCET/ROXICET) 5-325 MG per tablet, Take 1-2 tablets by mouth every 4 (four) hours as needed for severe pain., Disp: 30 tablet, Rfl: 0 prochlorperazine (COMPAZINE) 5 MG tablet, Take 1 tablet (5 mg total) by mouth every 6 (six) hours as needed for nausea or vomiting., Disp: 30 tablet, Rfl: 0;  vitamin B-12 (CYANOCOBALAMIN) 1000 MCG tablet, Take 1,000 mcg by mouth daily., Disp: , Rfl:   Allergies:  Allergies  Allergen Reactions  . Bee Venom Shortness Of Breath    Past Medical History, Surgical history, Social history, and Family History were reviewed and updated.  Review of Systems: As above  Physical Exam:  oral temperature is 97.8 F (36.6 C). Her blood pressure is 138/74 and her pulse is 67. Her respiration is 14.   Well-developed and well-nourished white female. Head and neck exam shows no ocular or oral lesions. There are no palpable cervical or supraclavicular status. Lungs are clear bilaterally. Cardiac exam regular rate and rhythm with no murmurs rubs or bruits. Abdomen soft. Has good bowel sounds present fluid wave. There is no palpable liver or spleen tip. Back exam no tenderness  over the spine ribs or hips. Extremities shows no clubbing cyanosis or edema. Neurological exam shows no focal neurological deficits.  Lab Results  Component Value Date   WBC 3.9 04/23/2014   HGB 10.7* 04/23/2014   HCT 29.8* 04/23/2014   MCV 99 04/23/2014   PLT 277 04/23/2014     Chemistry      Component Value Date/Time   NA 131* 03/11/2014 1111   NA 133 02/13/2014 1108   K 4.3 03/11/2014 1111   K 4.9* 02/13/2014 1108   CL 96 03/11/2014 1111   CL 97* 02/13/2014 1108   CO2 23 03/11/2014 1111   CO2 32 02/13/2014 1108   BUN  13 03/11/2014 1111   BUN 13 02/13/2014 1108   CREATININE 0.64 03/11/2014 1111   CREATININE 0.4* 02/13/2014 1108      Component Value Date/Time   CALCIUM 9.2 03/11/2014 1111   CALCIUM 9.3 02/13/2014 1108   ALKPHOS 102 03/11/2014 1111   ALKPHOS 63 02/13/2014 1108   AST 20 03/11/2014 1111   AST 17 02/13/2014 1108   ALT 33 03/11/2014 1111   ALT 15 02/13/2014 1108   BILITOT 2.0* 03/11/2014 1111   BILITOT 2.30* 02/13/2014 1108         Impression and Plan: Christine [name] is 68 year old white female with cold agglutinin disease. She does have an underlying lymphoproliferative ephropathy process. We have done a bone marrow and flow cytometry. There is noted to be a monoclonal population of cells in her blood in her bone marrow. The pathologist is not willing to call his lymphoma but I have to believe that this is a form of low-grade lymphoma. I will also think that the Rituxan should be effective in eradicating this.  We will follow Christine [name] in 3 months. I told her what to watch out for. I spent a good half hour with him. I said that this is a process that might take a while for to improve with the Rituxan.     Volanda Napoleon, MD 5/13/201511:00 AM

## 2014-04-29 LAB — COMPREHENSIVE METABOLIC PANEL
ALBUMIN: 4.3 g/dL (ref 3.5–5.2)
ALK PHOS: 71 U/L (ref 39–117)
ALT: 15 U/L (ref 0–35)
AST: 19 U/L (ref 0–37)
BUN: 11 mg/dL (ref 6–23)
CO2: 27 mEq/L (ref 19–32)
Calcium: 9.4 mg/dL (ref 8.4–10.5)
Chloride: 100 mEq/L (ref 96–112)
Creatinine, Ser: 0.72 mg/dL (ref 0.50–1.10)
Glucose, Bld: 98 mg/dL (ref 70–99)
Potassium: 4.6 mEq/L (ref 3.5–5.3)
SODIUM: 135 meq/L (ref 135–145)
Total Bilirubin: 1.6 mg/dL — ABNORMAL HIGH (ref 0.2–1.2)
Total Protein: 6.5 g/dL (ref 6.0–8.3)

## 2014-04-29 LAB — RETICULOCYTES (CHCC)
ABS RETIC: 167.4 10*3/uL (ref 19.0–186.0)
RBC.: 3.22 MIL/uL — AB (ref 3.87–5.11)
Retic Ct Pct: 5.2 % — ABNORMAL HIGH (ref 0.4–2.3)

## 2014-04-29 LAB — COLD AGGLUTININ TITER: Cold Agglutinin Titer: 1:20480 {titer} — AB

## 2014-04-29 LAB — LACTATE DEHYDROGENASE: LDH: 166 U/L (ref 94–250)

## 2014-07-23 ENCOUNTER — Other Ambulatory Visit (HOSPITAL_BASED_OUTPATIENT_CLINIC_OR_DEPARTMENT_OTHER): Payer: Medicare Other | Admitting: Lab

## 2014-07-23 ENCOUNTER — Encounter: Payer: Self-pay | Admitting: Hematology & Oncology

## 2014-07-23 ENCOUNTER — Ambulatory Visit (HOSPITAL_BASED_OUTPATIENT_CLINIC_OR_DEPARTMENT_OTHER): Payer: Medicare Other | Admitting: Hematology & Oncology

## 2014-07-23 VITALS — BP 123/70 | HR 65 | Temp 97.6°F | Resp 14 | Ht 65.0 in | Wt 136.0 lb

## 2014-07-23 DIAGNOSIS — D5912 Cold autoimmune hemolytic anemia: Secondary | ICD-10-CM

## 2014-07-23 DIAGNOSIS — D591 Autoimmune hemolytic anemia, unspecified: Secondary | ICD-10-CM

## 2014-07-23 LAB — CBC WITH DIFFERENTIAL (CANCER CENTER ONLY)
BASO#: 0 10*3/uL (ref 0.0–0.2)
BASO%: 1 % (ref 0.0–2.0)
EOS ABS: 0.1 10*3/uL (ref 0.0–0.5)
EOS%: 2 % (ref 0.0–7.0)
HCT: UNDETERMINED % (ref 34.8–46.6)
HEMOGLOBIN: 12 g/dL (ref 11.6–15.9)
LYMPH#: 0.7 10*3/uL — AB (ref 0.9–3.3)
LYMPH%: 23.2 % (ref 14.0–48.0)
MCH: UNDETERMINED pg (ref 26.0–34.0)
MCHC: UNDETERMINED g/dL (ref 32.0–36.0)
MCV: UNDETERMINED fL (ref 81–101)
MONO#: 0.3 10*3/uL (ref 0.1–0.9)
MONO%: 9.6 % (ref 0.0–13.0)
NEUT#: 1.9 10*3/uL (ref 1.5–6.5)
NEUT%: 64.2 % (ref 39.6–80.0)
PLATELETS: 268 10*3/uL (ref 145–400)
RBC: UNDETERMINED 10*6/uL (ref 3.70–5.32)
WBC: 2.9 10*3/uL — AB (ref 3.9–10.0)

## 2014-07-23 LAB — LACTATE DEHYDROGENASE: LDH: 144 U/L (ref 94–250)

## 2014-07-23 LAB — CHCC SATELLITE - SMEAR

## 2014-07-23 LAB — HAPTOGLOBIN

## 2014-07-24 NOTE — Progress Notes (Signed)
Hematology and Oncology Follow Up Visit  ADAEZE [name] [id number] [birth year] 68 y.o. 07/24/2014   Principle Diagnosis:   Cold agglutinin disease  Lymphoproliferative disorder  Current Therapy:    Status post 1 four-week cycle of Rituxan  Folic acid 1 mg by mouth daily     Interim History:  Ms.  [name] is back for followup. We last saw her back in May. She completed a four-week cycle of Rituxan in late April.  Her hemoglobin has come up nicely. We first saw her, her hemoglobin was down about 8-9.  She initially had a very high cold agglutinin titer. This has improved.  She's had no problems with pain in her hands or feet. There is no rashes. She's had no fatigue or weakness. She is still working pretty much full-time.  She's had no change in bowel or bladder habits. She's had no leg swelling. She is taking her folic acid daily.  Medications: Current outpatient prescriptions:Ascorbic Acid (VITAMIN C) 1000 MG tablet, Take 1,000 mg by mouth daily., Disp: , Rfl: ;  buPROPion (WELLBUTRIN XL) 300 MG 24 hr tablet, Take 300 mg by mouth every morning. , Disp: , Rfl: ;  Cholecalciferol (VITAMIN D3) 2000 UNITS TABS, Take 1 tablet by mouth daily., Disp: , Rfl: ;  diphenhydrAMINE (BENADRYL) 25 MG tablet, Take 25 mg by mouth every 6 (six) hours as needed., Disp: , Rfl:  escitalopram (LEXAPRO) 10 MG tablet, Take 10 mg by mouth every morning. , Disp: , Rfl: ;  folic acid (FOLVITE) 1 MG tablet, Take 2 tablets (2 mg total) by mouth daily., Disp: 60 tablet, Rfl: 12;  losartan-hydrochlorothiazide (HYZAAR) 100-25 MG per tablet, Take 1 tablet by mouth every morning., Disp: , Rfl: ;  LYSINE PO, Take 1 tablet by mouth daily., Disp: , Rfl:  magnesium gluconate (MAGONATE) 500 MG tablet, Take 500 mg by mouth daily., Disp: , Rfl: ;  vitamin B-12 (CYANOCOBALAMIN) 1000 MCG tablet, Take 1,000 mcg by mouth daily., Disp: , Rfl: ;  cyclobenzaprine (FLEXERIL) 10 MG tablet, Take 1 tablet (10 mg total) by mouth 3  (three) times daily as needed for muscle spasms., Disp: 20 tablet, Rfl: 0 prochlorperazine (COMPAZINE) 5 MG tablet, Take 1 tablet (5 mg total) by mouth every 6 (six) hours as needed for nausea or vomiting., Disp: 30 tablet, Rfl: 0  Allergies:  Allergies  Allergen Reactions  . Bee Venom Shortness Of Breath    Past Medical History, Surgical history, Social history, and Family History were reviewed and updated.  Review of Systems: As above  Physical Exam:  height is 5' 5"  (1.651 m) and weight is 136 lb (61.689 kg). Her oral temperature is 97.6 F (36.4 C). Her blood pressure is 123/70 and her pulse is 65. Her respiration is 14.   Petite but well-nourished white female in no obvious distress. Head and exam has no ocular or oral lesions. She has no scleral icterus. There is no adenopathy in the neck. Lungs are clear. Cardiac exam regular in rhythm with no murmurs rubs or bruits. Abdomen is soft. She has good bowel sounds. There is no fluid wave. There is no palpable liver or spleen tip. Back exam no tenderness over the spine ribs or hips. Extremities shows no clubbing cyanosis or edema. Neurological exam shows no focal neurological deficits. Skin exam no rashes, course or petechia. Lab Results  Component Value Date   WBC 2.9* 07/23/2014   HGB 12.0 07/23/2014   HCT Unable to Determine 07/23/2014   MCV Unable  to Determine 07/23/2014   PLT 268 07/23/2014     Chemistry      Component Value Date/Time   NA 135 04/23/2014 0922   NA 133 02/13/2014 1108   K 4.6 04/23/2014 0922   K 4.9* 02/13/2014 1108   CL 100 04/23/2014 0922   CL 97* 02/13/2014 1108   CO2 27 04/23/2014 0922   CO2 32 02/13/2014 1108   BUN 11 04/23/2014 0922   BUN 13 02/13/2014 1108   CREATININE 0.72 04/23/2014 0922   CREATININE 0.4* 02/13/2014 1108      Component Value Date/Time   CALCIUM 9.4 04/23/2014 0922   CALCIUM 9.3 02/13/2014 1108   ALKPHOS 71 04/23/2014 0922   ALKPHOS 63 02/13/2014 1108   AST 19 04/23/2014 0922   AST 17 02/13/2014 1108    ALT 15 04/23/2014 0922   ALT 15 02/13/2014 1108   BILITOT 1.6* 04/23/2014 0922   BILITOT 2.30* 02/13/2014 1108         Impression and Plan: Christine [name] is  68 year old female with cold glutenin disease. We'll we did a flow cytometry evaluation on her, she was on a have a monoclonal population of cells. These were felt to be be cells.  On a bone marrow biopsy that we did, there was no obvious formed lympho-proliferative process. There was a "minor" population of B cells. They expressed CD5. However, no lymphoma was identified.  We did go ahead and give her Rituxan. This has helped. Her hemoglobin is 12 today.  Since she is asymptomatic, we will go ahead and just follow her along. She will continue the folic acid.  Want to see her back in about 3 months.   Volanda Napoleon, MD 8/13/20155:09 PM

## 2014-07-25 LAB — RETICULOCYTES (CHCC)
ABS Retic: 54.1 10*3/uL (ref 19.0–186.0)
RETIC CT PCT: 3.3 % — AB (ref 0.4–2.3)

## 2014-10-22 ENCOUNTER — Other Ambulatory Visit (HOSPITAL_BASED_OUTPATIENT_CLINIC_OR_DEPARTMENT_OTHER): Payer: Medicare Other | Admitting: Lab

## 2014-10-22 ENCOUNTER — Other Ambulatory Visit (HOSPITAL_COMMUNITY)
Admission: RE | Admit: 2014-10-22 | Discharge: 2014-10-22 | Disposition: A | Discharge status: Home / Self Care | Payer: Medicare Other | Source: Ambulatory Visit | Attending: Hematology & Oncology | Admitting: Hematology & Oncology

## 2014-10-22 ENCOUNTER — Ambulatory Visit (HOSPITAL_BASED_OUTPATIENT_CLINIC_OR_DEPARTMENT_OTHER): Payer: Medicare Other | Admitting: Hematology & Oncology

## 2014-10-22 ENCOUNTER — Encounter: Payer: Self-pay | Admitting: Hematology & Oncology

## 2014-10-22 VITALS — BP 133/82 | HR 68 | Temp 98.0°F | Resp 16 | Wt 137.0 lb

## 2014-10-22 DIAGNOSIS — D591 Other autoimmune hemolytic anemias: Secondary | ICD-10-CM

## 2014-10-22 DIAGNOSIS — D500 Iron deficiency anemia secondary to blood loss (chronic): Secondary | ICD-10-CM | POA: Diagnosis present

## 2014-10-22 DIAGNOSIS — D5912 Cold autoimmune hemolytic anemia: Secondary | ICD-10-CM

## 2014-10-22 LAB — CHCC SATELLITE - SMEAR

## 2014-10-22 LAB — CBC WITH DIFFERENTIAL (CANCER CENTER ONLY)
BASO#: 0.1 10*3/uL (ref 0.0–0.2)
BASO%: 1.6 % (ref 0.0–2.0)
EOS%: 3.5 % (ref 0.0–7.0)
Eosinophils Absolute: 0.1 10*3/uL (ref 0.0–0.5)
HCT: 27.7 % — ABNORMAL LOW (ref 34.8–46.6)
HGB: 10 g/dL — ABNORMAL LOW (ref 11.6–15.9)
LYMPH#: 0.7 10*3/uL — ABNORMAL LOW (ref 0.9–3.3)
LYMPH%: 22.1 % (ref 14.0–48.0)
MCH: 35.8 pg — AB (ref 26.0–34.0)
MCHC: 36.1 g/dL — AB (ref 32.0–36.0)
MCV: 99 fL (ref 81–101)
MONO#: 0.4 10*3/uL (ref 0.1–0.9)
MONO%: 11.9 % (ref 0.0–13.0)
NEUT#: 1.9 10*3/uL (ref 1.5–6.5)
NEUT%: 60.9 % (ref 39.6–80.0)
PLATELETS: 300 10*3/uL (ref 145–400)
RBC: 2.79 10*6/uL — ABNORMAL LOW (ref 3.70–5.32)
RDW: 13.6 % (ref 11.1–15.7)
WBC: 3.1 10*3/uL — ABNORMAL LOW (ref 3.9–10.0)

## 2014-10-22 LAB — TECHNOLOGIST REVIEW CHCC SATELLITE

## 2014-10-23 NOTE — Progress Notes (Signed)
Hematology and Oncology Follow Up Visit  Christine [name] [id number] 1946-08-05 68 y.o. 10/23/2014   Principle Diagnosis:   Cold agglutinin disease  Lymphoproliferative disorder  Current Therapy:    Status post 1 four-week cycle of Rituxan  Folic acid 1 mg by mouth daily     Interim History:  Ms.  Christine [name] is back for followup. We last saw her back in May. She completed a four-week cycle of Rituxan in late April.  She has been complaining of vertigo. This is been going on for the past 2-3 weeks. She gets vertigo every once in a while. I don't think this is related at all to her cold agglutinin issue. She sees her family doctor for this.  She is still working.   she's had no fever. She's had no fatigue. Before the vertigo, she was doing her exercising.  She's had no problems with bowels or bladder. There's been no abdominal pain. She's had no tinnitus.she's not see any obvious bleeding. There's been no leg swelling. She's had no rashes.    Medications: Current outpatient prescriptions: Ascorbic Acid (VITAMIN C) 1000 MG tablet, Take 1,000 mg by mouth daily., Disp: , Rfl: ;  buPROPion (WELLBUTRIN XL) 300 MG 24 hr tablet, Take 300 mg by mouth every morning. , Disp: , Rfl: ;  Cholecalciferol (VITAMIN D3) 2000 UNITS TABS, Take 1 tablet by mouth daily., Disp: , Rfl:  cyclobenzaprine (FLEXERIL) 10 MG tablet, Take 1 tablet (10 mg total) by mouth 3 (three) times daily as needed for muscle spasms., Disp: 20 tablet, Rfl: 0;  diazepam (VALIUM) 5 MG tablet, , Disp: , Rfl: 0;  diphenhydrAMINE (BENADRYL) 25 MG tablet, Take 25 mg by mouth every 6 (six) hours as needed., Disp: , Rfl: ;  escitalopram (LEXAPRO) 10 MG tablet, Take 10 mg by mouth every morning. , Disp: , Rfl:  folic acid (FOLVITE) 1 MG tablet, Take 2 tablets (2 mg total) by mouth daily., Disp: 60 tablet, Rfl: 12;  losartan-hydrochlorothiazide (HYZAAR) 100-25 MG per tablet, Take 1 tablet by mouth every morning., Disp: , Rfl: ;  LYSINE PO,  Take 1 tablet by mouth daily., Disp: , Rfl: ;  magnesium gluconate (MAGONATE) 500 MG tablet, Take 500 mg by mouth daily., Disp: , Rfl: ;  meclizine (ANTIVERT) 25 MG tablet, , Disp: , Rfl: 1 vitamin B-12 (CYANOCOBALAMIN) 1000 MCG tablet, Take 1,000 mcg by mouth daily., Disp: , Rfl: ;  EPIPEN 2-PAK 0.3 MG/0.3ML SOAJ injection, , Disp: , Rfl: 6;  prochlorperazine (COMPAZINE) 5 MG tablet, Take 1 tablet (5 mg total) by mouth every 6 (six) hours as needed for nausea or vomiting., Disp: 30 tablet, Rfl: 0  Allergies:  Allergies  Allergen Reactions  . Bee Venom Shortness Of Breath    Past Medical History, Surgical history, Social history, and Family History were reviewed and updated.  Review of Systems: As above  Physical Exam:  weight is 137 lb (62.143 kg). Her oral temperature is 98 F (36.7 C). Her blood pressure is 133/82 and her pulse is 68. Her respiration is 16.   Petite but well-nourished white female in no obvious distress. Head and exam has no ocular or oral lesions. She has no scleral icterus. There is no adenopathy in the neck. Lungs are clear. Cardiac exam regular rate and rhythm with no murmurs rubs or bruits. Abdomen is soft. She has good bowel sounds. There is no fluid wave. There is no palpable liver or spleen tip. Back exam no tenderness over the spine ribs  or hips. Extremities shows no clubbing cyanosis or edema. Neurological exam shows no focal neurological deficits. Skin exam no rashes, course or petechia. Lab Results  Component Value Date   WBC 3.1* 10/22/2014   HGB 10.0* 10/22/2014   HCT 27.7* 10/22/2014   MCV 99 10/22/2014   PLT 300 10/22/2014     Chemistry      Component Value Date/Time   NA 135 04/23/2014 0922   NA 133 02/13/2014 1108   K 4.6 04/23/2014 0922   K 4.9* 02/13/2014 1108   CL 100 04/23/2014 0922   CL 97* 02/13/2014 1108   CO2 27 04/23/2014 0922   CO2 32 02/13/2014 1108   BUN 11 04/23/2014 0922   BUN 13 02/13/2014 1108   CREATININE 0.72 04/23/2014  0922   CREATININE 0.4* 02/13/2014 1108      Component Value Date/Time   CALCIUM 9.4 04/23/2014 0922   CALCIUM 9.3 02/13/2014 1108   ALKPHOS 71 04/23/2014 0922   ALKPHOS 63 02/13/2014 1108   AST 19 04/23/2014 0922   AST 17 02/13/2014 1108   ALT 15 04/23/2014 0922   ALT 15 02/13/2014 1108   BILITOT 1.6* 04/23/2014 0922   BILITOT 2.30* 02/13/2014 1108         Impression and Plan: [name] is 68 year old female with cold agglutinin disease. We'll we did a flow cytometry evaluation on her, she was found to have a monoclonal population of cells. These were felt to be lymphoma cells.  On a bone marrow biopsy that we did, there was no obvious lympho-proliferative process. There was a "minor" population of B cells. They expressed CD5. However, no lymphoma was identified.  We did go ahead and give her Rituxan. This has helped.  It is no surprise that her blood count is down a little bit.  Her reticulocyte count is holding pretty steady. Since she is asymptomatic, we will go ahead and just follow her along. She will continue the folic acid.  I hope that the vertigo will improve.  Want to see her back in about 3 months.   Volanda Napoleon, MD 11/12/20157:51 AM

## 2014-10-24 LAB — FLOW CYTOMETRY - CHCC SATELLITE

## 2014-10-25 LAB — RETICULOCYTES (CHCC)
ABS Retic: 110.2 10*3/uL (ref 19.0–186.0)
RBC.: 3.06 MIL/uL — ABNORMAL LOW (ref 3.87–5.11)
RETIC CT PCT: 3.6 % — AB (ref 0.4–2.3)

## 2014-10-25 LAB — COLD AGGLUTININ TITER

## 2014-12-03 ENCOUNTER — Encounter: Payer: Self-pay | Admitting: Physical Therapy

## 2014-12-03 ENCOUNTER — Ambulatory Visit: Payer: Medicare Other | Attending: Internal Medicine | Admitting: Physical Therapy

## 2014-12-03 DIAGNOSIS — H8112 Benign paroxysmal vertigo, left ear: Secondary | ICD-10-CM | POA: Insufficient documentation

## 2014-12-03 NOTE — Therapy (Signed)
[facility] [address] [place], [place], [address] Phone: [phone]   Fax:  [phone]  Physical Therapy Evaluation  Patient Details  Name: Christine [name] MRN: [id number] Date of Birth: 06/24/1946  Encounter Date: 12/03/2014      PT End of Session - 12/03/14 1233    Visit Number 1  G1   Number of Visits 4   Date for PT Re-Evaluation 01/03/15   Authorization Type Blue medicare   PT Start Time 1020   PT Stop Time 1103   PT Time Calculation (min) 43 min      Past Medical History  Diagnosis Date  . Hypertension   . Deep venous thrombosis of calf   . Arthritis     hands  . History of blood transfusion 2009  . PONV (postoperative nausea and vomiting)     nausea only yrs ago  . 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 Joint replacement Left     knee  . Abdominal hysterectomy  age 68  . Cataract surgery Bilateral 2011  . Cholecystectomy N/A 09/20/2013    Procedure: LAPAROSCOPIC CHOLECYSTECTOMY WITH INTRAOPERATIVE CHOLANGIOGRAM;  Surgeon: Odis Hollingshead, MD;  Location: [facility];  Service: General;  Laterality: N/A;    There were no vitals taken for this visit.  Visit Diagnosis:  BPPV (benign paroxysmal positional vertigo), left - Plan: PT plan of care cert/re-cert      Subjective Assessment - 12/03/14 1222    Symptoms Pt. reports she had vertigo during a Pilates ex. class when she bent over and stood back up;  also c/o vertigo with turning over in bed on left side   Pertinent History gall bladder surgery, TKR in 2010   Currently in Pain? No/denies              Vestibular Assessment - 12/03/14 1231    Type of Dizziness Spinning   Frequency of Dizziness depends on activity   Duration of Dizziness seconds   Aggravating Factors Looking up to the ceiling;Rolling to left;Forward bending   Relieving Factors Lying supine   Dix-Hallpike  Dix-Hallpike Left   Sidelying Test Sidelying Left   Dix-Hallpike Left Duration 3-4 secs   Dix-Hallpike Left Symptoms Upbeat, left rotatory nystagmus     Symptoms consistent with L BPPV; pt. Was treated with canalith repositioning maneuver (Epley's maneuver) x 3 reps with no c/o vertigo reported on 3rd rep.  Pt. Was instructed to perform Brandt-Daroff exercises if BPPV not fully resolved.               PT Education - 12/03/14 1232    Education provided Yes   Education Details BPPV handout and Brandt-Daroff exercises   Person(s) Educated Patient   Methods Explanation;Demonstration   Comprehension Verbalized understanding;Returned demonstration          PT Short Term Goals - 12/03/14 1237    PT SHORT TERM GOAL #1   Title same as LTG's           PT Long Term Goals - 12/03/14 1238    PT LONG TERM GOAL #1   Title Pt. will have a (-) L Dix-Hallpike test without nystagmus and no c/o vertigo in test position to indicate that L BPPV has resolved.   Baseline 01-02-15   Time 4   Period Weeks   Status New   PT LONG TERM GOAL #2   Title Pt. will report no  vertigo with bed mobility or with ambulation.   Baseline 01-02-15   Time 4   Period Weeks   Status New   PT LONG TERM GOAL #3   Title Independent in HEP for Brandt-Daroff exercises.   Baseline 01-02-15   Time 4   Period Weeks   Status New               Plan - 2014/12/14 1234    Clinical Impression Statement Pt. has L BPPV with c/o vertigo and rotary nystagmus in Dix-Hallpike position; appeared to have resolved by 3rd rep of Epley's maneuver   Pt will benefit from skilled therapeutic intervention in order to improve on the following deficits Abnormal gait;Decreased balance  vertigo   Rehab Potential Good   PT Frequency 1x / week   PT Duration 4 weeks   PT Treatment/Interventions Other (comment);Neuromuscular re-education;Balance training;Patient/family education;ADLs/Self Care Home Management;Therapeutic  exercise;Gait training  epley's maneuver; vestibular rehab   PT Next Visit Plan recheck L Dix-Hallpike test; canalith repositioning prn   PT Home Exercise Plan Brandt-Daroff exercises   Consulted and Agree with Plan of Care Patient          G-Codes - 12/14/14 1241    Functional Assessment Tool Used Marye Round test (+) with nystagmus and c/o vertigo; FOTO physical primary measure score 61/100: DHI 42/100   Functional Limitation Other PT subsequent  vertigo due to L BPPV   Other PT Secondary Current Status (K4628) At least 20 percent but less than 40 percent impaired, limited or restricted   Other PT Secondary Goal Status (M3817) At least 1 percent but less than 20 percent impaired, limited or restricted       Problem List Patient Active Problem List   Diagnosis Date Noted  . 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 Symptomatic cholelithiasis 09/02/2013  . Anemia 12/23/2008  . ANXIETY DEPRESSION 12/23/2008  . Unspecified Essential Hypertension 11/22/2007  . PAIN IN JOINT, SITE UNSPECIFIED 11/22/2007    Alda Lea, PT 12/14/2014, 12:46 PM  [facility] [address] [place], [place], [address] Phone: [phone]   Fax:  [phone]

## 2014-12-03 NOTE — Patient Instructions (Signed)
Instructed pt. To perform Brandt-Daroff exercises 3-5 x/day starting tom. If vertigo is not fully resolved

## 2014-12-16 ENCOUNTER — Ambulatory Visit: Payer: Medicare Other | Attending: Internal Medicine | Admitting: Physical Therapy

## 2014-12-16 DIAGNOSIS — H8112 Benign paroxysmal vertigo, left ear: Secondary | ICD-10-CM | POA: Diagnosis not present

## 2014-12-17 ENCOUNTER — Encounter: Payer: Self-pay | Admitting: Physical Therapy

## 2014-12-17 NOTE — Therapy (Signed)
[facility] [address] [place], [place], [address] Phone: [phone]   Fax:  [phone]  Physical Therapy Treatment  Patient Details  Name: Christine [name] MRN: [id number] Date of Birth: 1946-03-02  Encounter Date: 12/16/2014      PT End of Session - 12/17/14 1024    Visit Number 2  G2   Number of Visits 4   Date for PT Re-Evaluation 01/03/15   Authorization Type Blue medicare   PT Start Time 0349   PT Stop Time 1620   PT Time Calculation (min) 45 min      Past Medical History  Diagnosis Date  . Hypertension   . Deep venous thrombosis of calf   . Arthritis     hands  . History of blood transfusion 2009  . PONV (postoperative nausea and vomiting)     nausea only yrs ago  . 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 Joint replacement Left     knee  . Abdominal hysterectomy  age 33  . Cataract surgery Bilateral 2011  . Cholecystectomy N/A 09/20/2013    Procedure: LAPAROSCOPIC CHOLECYSTECTOMY WITH INTRAOPERATIVE CHOLANGIOGRAM;  Surgeon: Odis Hollingshead, MD;  Location: [facility];  Service: General;  Laterality: N/A;    There were no vitals taken for this visit.  Visit Diagnosis:  BPPV (benign paroxysmal positional vertigo), left      Subjective Assessment - 12/17/14 1018    Symptoms Pt. states she continues to have vertigo - maybe somewhat improved since last treatment but not fully resolved   Pertinent History gall bladder surgery, TKR in 2010   Patient Stated Goals resolve the vertigo   Currently in Pain? No/denies              Vestibular Assessment - 12/17/14 1020    Type of Dizziness Spinning   Frequency of Dizziness depends on activity   Duration of Dizziness seconds   Aggravating Factors Looking up to the ceiling;Rolling to left;Forward bending   Relieving Factors Lying supine   Dix-Hallpike Dix-Hallpike Left   Dix-Hallpike Left  Symptoms Upbeat, left rotatory nystagmus       Pt. Was treated with Epley's maneuver (canalith repositioning maneuver) x 3 reps for L BPPV; 2nd rep had delayed  Onset rotary nystagmus with prolonged duration - approx. 15 secs - indicative of R cupulolithiasis  Treated again with Epley's maneuver and pt. Reported no vertigo on 3rd rep and no nystagmus observed.  Discussed treatment for cervical soreness beginning - a secondary problem to the L BPPV - pt. Verbalized understanding               PT Education - 12/17/14 1021    Education provided Yes   Education Details continue with Brandt-Daroff exercises if not resolved; begin moist heat and cervical ROM to address neck stiffness   Person(s) Educated Patient   Methods Explanation;Demonstration   Comprehension Verbalized understanding          PT Short Term Goals - 12/03/14 1237    PT SHORT TERM GOAL #1   Title same as LTG's           PT Long Term Goals - 12/03/14 1238    PT LONG TERM GOAL #1   Title Pt. will have a (-) L Dix-Hallpike test without nystagmus and no c/o vertigo in test position to indicate that L BPPV has resolved.   Baseline 01-02-15   Time  4   Period Weeks   Status New   PT LONG TERM GOAL #2   Title Pt. will report no vertigo with bed mobility or with ambulation.   Baseline 01-02-15   Time 4   Period Weeks   Status New   PT LONG TERM GOAL #3   Title Independent in HEP for Brandt-Daroff exercises.   Baseline 01-02-15   Time 4   Period Weeks   Status New               Plan - 12/17/14 1025    Clinical Impression Statement Pt. had severe rotary nystagmus (L upbeating with prolonged duration) indicative of cupulolithiasis but appears to have resolved on 3rd rep of Epley's maneuver   Pt will benefit from skilled therapeutic intervention in order to improve on the following deficits Abnormal gait;Decreased balance   Rehab Potential Good   PT Frequency 1x / week   PT Duration 4 weeks    PT Treatment/Interventions Other (comment);Neuromuscular re-education;Balance training;Patient/family education;ADLs/Self Care Home Management;Therapeutic exercise;Gait training   PT Next Visit Plan recheck L Dix-Hallpike test; canalith repositioning prn   PT Home Exercise Plan Brandt-Daroff exercises   Consulted and Agree with Plan of Care Patient        Problem List Patient Active Problem List   Diagnosis Date Noted  . 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 Symptomatic cholelithiasis 09/02/2013  . Anemia 12/23/2008  . ANXIETY DEPRESSION 12/23/2008  . Unspecified Essential Hypertension 11/22/2007  . PAIN IN JOINT, SITE UNSPECIFIED 11/22/2007    DildayJenness Corner, PT 12/17/2014, 10:28 AM  [facility] [address] [place], [place], [address] Phone: [phone]   Fax:  [phone]

## 2014-12-25 ENCOUNTER — Ambulatory Visit: Payer: Medicare Other | Admitting: Physical Therapy

## 2015-01-07 ENCOUNTER — Telehealth: Payer: Self-pay | Admitting: Hematology & Oncology

## 2015-01-07 NOTE — Telephone Encounter (Signed)
Pt left message to r/s 01-21-15. I left her message I cx 01-21-15 and to call and reschedule

## 2015-01-21 ENCOUNTER — Other Ambulatory Visit: Payer: Medicare Other | Admitting: Lab

## 2015-01-21 ENCOUNTER — Ambulatory Visit: Payer: Medicare Other | Admitting: Hematology & Oncology

## 2015-01-26 ENCOUNTER — Encounter: Payer: Self-pay | Admitting: Lab

## 2015-01-26 ENCOUNTER — Ambulatory Visit (HOSPITAL_BASED_OUTPATIENT_CLINIC_OR_DEPARTMENT_OTHER): Payer: Medicare Other | Admitting: Hematology & Oncology

## 2015-01-26 ENCOUNTER — Other Ambulatory Visit: Payer: Self-pay | Admitting: *Deleted

## 2015-01-26 ENCOUNTER — Ambulatory Visit (HOSPITAL_BASED_OUTPATIENT_CLINIC_OR_DEPARTMENT_OTHER): Payer: Medicare Other | Admitting: Lab

## 2015-01-26 DIAGNOSIS — E032 Hypothyroidism due to medicaments and other exogenous substances: Secondary | ICD-10-CM

## 2015-01-26 DIAGNOSIS — D591 Other autoimmune hemolytic anemias: Secondary | ICD-10-CM

## 2015-01-26 DIAGNOSIS — D5912 Cold autoimmune hemolytic anemia: Secondary | ICD-10-CM

## 2015-01-26 DIAGNOSIS — D500 Iron deficiency anemia secondary to blood loss (chronic): Secondary | ICD-10-CM

## 2015-01-26 DIAGNOSIS — D589 Hereditary hemolytic anemia, unspecified: Secondary | ICD-10-CM

## 2015-01-26 DIAGNOSIS — E039 Hypothyroidism, unspecified: Secondary | ICD-10-CM

## 2015-01-26 LAB — CBC WITH DIFFERENTIAL (CANCER CENTER ONLY)
BASO#: 0 10*3/uL (ref 0.0–0.2)
BASO%: 0.8 % (ref 0.0–2.0)
EOS%: 1.9 % (ref 0.0–7.0)
Eosinophils Absolute: 0.1 10*3/uL (ref 0.0–0.5)
HCT: 24.2 % — ABNORMAL LOW (ref 34.8–46.6)
HGB: 8.8 g/dL — ABNORMAL LOW (ref 11.6–15.9)
LYMPH#: 0.9 10*3/uL (ref 0.9–3.3)
LYMPH%: 19.9 % (ref 14.0–48.0)
MCH: 37.4 pg — ABNORMAL HIGH (ref 26.0–34.0)
MCHC: 36.4 g/dL — ABNORMAL HIGH (ref 32.0–36.0)
MCV: 103 fL — ABNORMAL HIGH (ref 81–101)
MONO#: 0.5 10*3/uL (ref 0.1–0.9)
MONO%: 10.4 % (ref 0.0–13.0)
NEUT#: 3.2 10*3/uL (ref 1.5–6.5)
NEUT%: 67 % (ref 39.6–80.0)
PLATELETS: 378 10*3/uL (ref 145–400)
RBC: 2.35 10*6/uL — AB (ref 3.70–5.32)
RDW: 16.7 % — AB (ref 11.1–15.7)
WBC: 4.7 10*3/uL (ref 3.9–10.0)

## 2015-01-26 LAB — CHCC SATELLITE - SMEAR

## 2015-01-26 MED ORDER — FOLIC ACID 1 MG PO TABS: 2.0000 mg | ORAL_TABLET | Freq: Every day | ORAL | Status: DC

## 2015-01-26 NOTE — Progress Notes (Signed)
Hematology and Oncology Follow Up Visit  Christine [name] [id number] 1946/02/02 69 y.o. 01/26/2015   Principle Diagnosis:   Cold agglutinin disease  Lymphoproliferative disorder  Current Therapy:    Status post 1 four-week cycle of Rituxan  Folic acid 1 mg by mouth daily     Interim History:  Christine [name] is back for followup. She is doing okay. Her husband has had knee surgery. She has been able to do as much. She still working out.  She has a little bit more fatigue. She does not have hypothyroidism by her family doctor in November. She now is on Synthroid at 0.025 mg. We will recheck another TSH on her while she is here today. This will save her an extra trip to her doctor's office. We can let him know the results.  She's had no bleeding. There's been no change in bowel or bladder habits. She worried that her weight not going down despite exercising.  She's had no fever. She's had no abdominal pain. She's had no cough. Her appetite has been good. There is no nausea or vomiting.  There has been no leg swelling. She's had no rashes.   Medications:  Current outpatient prescriptions:  .  Ascorbic Acid (VITAMIN C) 1000 MG tablet, Take 1,000 mg by mouth daily., Disp: , Rfl:  .  buPROPion (WELLBUTRIN XL) 300 MG 24 hr tablet, Take 300 mg by mouth every morning. , Disp: , Rfl:  .  Cholecalciferol (VITAMIN D3) 2000 UNITS TABS, Take 1 tablet by mouth daily., Disp: , Rfl:  .  diphenhydrAMINE (BENADRYL) 25 MG tablet, Take 25 mg by mouth every 6 (six) hours as needed., Disp: , Rfl:  .  EPIPEN 2-PAK 0.3 MG/0.3ML SOAJ injection, , Disp: , Rfl: 6 .  folic acid (FOLVITE) 1 MG tablet, Take 2 tablets (2 mg total) by mouth daily., Disp: 60 tablet, Rfl: 12 .  levothyroxine (SYNTHROID, LEVOTHROID) 50 MCG tablet, Take 50 mcg by mouth daily., Disp: , Rfl: 6 .  losartan-hydrochlorothiazide (HYZAAR) 100-25 MG per tablet, Take 1 tablet by mouth every morning., Disp: , Rfl:  .  LYSINE PO, Take 1 tablet  by mouth daily., Disp: , Rfl:  .  magnesium gluconate (MAGONATE) 500 MG tablet, Take 500 mg by mouth daily., Disp: , Rfl:  .  prochlorperazine (COMPAZINE) 5 MG tablet, Take 1 tablet (5 mg total) by mouth every 6 (six) hours as needed for nausea or vomiting., Disp: 30 tablet, Rfl: 0 .  vitamin B-12 (CYANOCOBALAMIN) 1000 MCG tablet, Take 1,000 mcg by mouth daily., Disp: , Rfl:   Allergies:  Allergies  Allergen Reactions  . Bee Venom Shortness Of Breath    Past Medical History, Surgical history, Social history, and Family History were reviewed and updated.  Review of Systems: As above  Physical Exam:  vitals were not taken for this visit.  Petite but well-nourished white female in no obvious distress. Head and exam has no ocular or oral lesions. She has no scleral icterus. There is no adenopathy in the neck. Lungs are clear. Cardiac exam regular rate and rhythm with no murmurs rubs or bruits. Abdomen is soft. She has good bowel sounds. There is no fluid wave. There is no palpable liver or spleen tip. Back exam no tenderness over the spine ribs or hips. Extremities shows no clubbing cyanosis or edema. Neurological exam shows no focal neurological deficits. Skin exam no rashes, course or petechia. Lab Results  Component Value Date   WBC 4.7 01/26/2015  HGB 8.8* 01/26/2015   HCT 24.2* 01/26/2015   MCV 103* 01/26/2015   PLT 378 01/26/2015     Chemistry      Component Value Date/Time   NA 135 04/23/2014 0922   NA 133 02/13/2014 1108   K 4.6 04/23/2014 0922   K 4.9* 02/13/2014 1108   CL 100 04/23/2014 0922   CL 97* 02/13/2014 1108   CO2 27 04/23/2014 0922   CO2 32 02/13/2014 1108   BUN 11 04/23/2014 0922   BUN 13 02/13/2014 1108   CREATININE 0.72 04/23/2014 0922   CREATININE 0.4* 02/13/2014 1108      Component Value Date/Time   CALCIUM 9.4 04/23/2014 0922   CALCIUM 9.3 02/13/2014 1108   ALKPHOS 71 04/23/2014 0922   ALKPHOS 63 02/13/2014 1108   AST 19 04/23/2014 0922   AST 17  02/13/2014 1108   ALT 15 04/23/2014 0922   ALT 15 02/13/2014 1108   BILITOT 1.6* 04/23/2014 0922   BILITOT 2.30* 02/13/2014 1108         Impression and Plan: Christine [name] is 69 year old female with cold agglutinin disease. We'll we did a flow cytometry evaluation on her, she was found to have a monoclonal population of cells. These were felt to be lymphoma cells.  On a bone marrow biopsy that we did, there was no obvious lympho-proliferative process. There was a "minor" population of B cells. They expressed CD5. However, no lymphoma was identified.  We did go ahead and give her Rituxan. This has helped.  Her hemoglobin is trending downward. Again, this is no surprises given the winter season and the cold weather we've had lately.  She was originally found to have some hypo-thyroidism. It'll be interesting to see if this is in any way related to her anemia getting a little worse.  I have to suspect that is the cold agglutinin disease that is going to be the main problem. Her titer was greater than 1:20,000 back in November.  If we do have to treat her again, I probably would add Cytoxan to the Rituxan.  I want her back in 2 months. By then I will be springtime. If her hemoglobin is better, then we can just follow her supportively.  I reiterated to her the importance of making sure that she dresses warmly and that she keeps her hands and gloves and a hat on her head.  I also told her that the more she worked out, the better off her blood might be as this will increase her core body temperature.  I spent about 35 minutes with her today.   Volanda Napoleon, MD 2/15/20164:21 PM

## 2015-01-27 LAB — TSH CHCC: TSH: 2.052 m[IU]/L (ref 0.308–3.960)

## 2015-01-27 LAB — FERRITIN CHCC: Ferritin: 374 ng/ml — ABNORMAL HIGH (ref 9–269)

## 2015-01-27 LAB — IRON AND TIBC CHCC
%SAT: 63 % — ABNORMAL HIGH (ref 21–57)
Iron: 157 ug/dL — ABNORMAL HIGH (ref 41–142)
TIBC: 248 ug/dL (ref 236–444)
UIBC: 91 ug/dL — ABNORMAL LOW (ref 120–384)

## 2015-01-28 ENCOUNTER — Telehealth: Payer: Self-pay | Admitting: Nurse Practitioner

## 2015-01-28 NOTE — Telephone Encounter (Addendum)
-----   Message from Volanda Napoleon, MD sent at 01/27/2015 12:36 PM EST ----- Please call and let her know that the iron level is okay. Also let her know that he thyroid level is okay. Her Synthroid dose does not have to be changed. Her anemia is strictly from the cold agglutinin disease. Thanks  Pt verbalized understanding and appreciation.

## 2015-01-30 LAB — RETICULOCYTES (CHCC)
ABS Retic: 141 10*3/uL (ref 19.0–186.0)
RBC.: 2.82 MIL/uL — ABNORMAL LOW (ref 3.87–5.11)
Retic Ct Pct: 5 % — ABNORMAL HIGH (ref 0.4–2.3)

## 2015-01-30 LAB — COLD AGGLUTININ TITER: Cold Agglutinin Titer: 1:1280 {titer} — AB

## 2015-01-30 LAB — LACTATE DEHYDROGENASE: LDH: 242 U/L (ref 94–250)

## 2015-01-30 LAB — BETA 2 MICROGLOBULIN, SERUM: BETA 2 MICROGLOBULIN: 2.05 mg/L (ref <=2.51)

## 2015-03-05 ENCOUNTER — Other Ambulatory Visit: Payer: Self-pay | Admitting: Family

## 2015-03-16 ENCOUNTER — Telehealth: Payer: Self-pay | Admitting: Hematology & Oncology

## 2015-03-16 NOTE — Telephone Encounter (Signed)
Pt moved 4-14 to 4-28 she has to take a real estate class.

## 2015-03-26 ENCOUNTER — Other Ambulatory Visit: Payer: Medicare Other

## 2015-03-26 ENCOUNTER — Ambulatory Visit: Payer: Medicare Other | Admitting: Hematology & Oncology

## 2015-04-09 ENCOUNTER — Ambulatory Visit (HOSPITAL_BASED_OUTPATIENT_CLINIC_OR_DEPARTMENT_OTHER): Payer: Medicare Other | Admitting: Hematology & Oncology

## 2015-04-09 ENCOUNTER — Other Ambulatory Visit (HOSPITAL_BASED_OUTPATIENT_CLINIC_OR_DEPARTMENT_OTHER): Payer: Medicare Other

## 2015-04-09 VITALS — BP 124/72 | HR 76 | Temp 97.4°F | Wt 133.0 lb

## 2015-04-09 DIAGNOSIS — D591 Other autoimmune hemolytic anemias: Secondary | ICD-10-CM

## 2015-04-09 DIAGNOSIS — D5912 Cold autoimmune hemolytic anemia: Secondary | ICD-10-CM

## 2015-04-09 DIAGNOSIS — D500 Iron deficiency anemia secondary to blood loss (chronic): Secondary | ICD-10-CM

## 2015-04-09 DIAGNOSIS — E032 Hypothyroidism due to medicaments and other exogenous substances: Secondary | ICD-10-CM

## 2015-04-09 LAB — CHCC SATELLITE - SMEAR

## 2015-04-09 LAB — CBC WITH DIFFERENTIAL (CANCER CENTER ONLY)
BASO#: 0 10*3/uL (ref 0.0–0.2)
BASO%: 0.7 % (ref 0.0–2.0)
EOS%: 1.6 % (ref 0.0–7.0)
Eosinophils Absolute: 0.1 10*3/uL (ref 0.0–0.5)
HEMATOCRIT: 26.6 % — AB (ref 34.8–46.6)
HGB: 9.3 g/dL — ABNORMAL LOW (ref 11.6–15.9)
LYMPH#: 0.8 10*3/uL — AB (ref 0.9–3.3)
LYMPH%: 19.2 % (ref 14.0–48.0)
MCH: 33.7 pg (ref 26.0–34.0)
MCHC: 35 g/dL (ref 32.0–36.0)
MCV: 96 fL (ref 81–101)
MONO#: 0.4 10*3/uL (ref 0.1–0.9)
MONO%: 8.2 % (ref 0.0–13.0)
NEUT#: 3 10*3/uL (ref 1.5–6.5)
NEUT%: 70.3 % (ref 39.6–80.0)
Platelets: 332 10*3/uL (ref 145–400)
RBC: 2.76 10*6/uL — ABNORMAL LOW (ref 3.70–5.32)
RDW: 15.5 % (ref 11.1–15.7)
WBC: 4.3 10*3/uL (ref 3.9–10.0)

## 2015-04-09 NOTE — Progress Notes (Signed)
Hematology and Oncology Follow Up Visit  Christine [name] [id number] 09/05/1946 69 y.o. 04/09/2015   Principle Diagnosis:   Cold agglutinin disease  Lymphoproliferative disorder  Current Therapy:    Status post 1 four-week cycle of Rituxan  Folic acid 1 mg by mouth daily     Interim History:  Ms.  [name] is back for followup. she is doing quite well. She has been very active. She is still working. She's exercising. She will be traveling soon.  She's had no fatigue or weakness. She's had no bleeding. She's had no rashes.  She takes her folic acid. She is very diligent with this.  There's been no change in bowel or bladder habits.    she's had no issues with abdominal pain.there is no weight loss or weight gain.  She's had no cough. There's been no shortness of breath.  There has been no leg swelling. She's had no rashes.   Medications:  Current outpatient prescriptions:  .  buPROPion (WELLBUTRIN XL) 300 MG 24 hr tablet, Take 300 mg by mouth every morning. , Disp: , Rfl:  .  EPIPEN 2-PAK 0.3 MG/0.3ML SOAJ injection, , Disp: , Rfl: 6 .  folic acid (FOLVITE) 1 MG tablet, Take 2 tablets (2 mg total) by mouth daily., Disp: 60 tablet, Rfl: 12 .  levothyroxine (SYNTHROID, LEVOTHROID) 50 MCG tablet, Take 50 mcg by mouth daily., Disp: , Rfl: 6 .  losartan-hydrochlorothiazide (HYZAAR) 100-25 MG per tablet, Take 1 tablet by mouth every morning., Disp: , Rfl:   Allergies:  Allergies  Allergen Reactions  . Bee Venom Shortness Of Breath    Past Medical History, Surgical history, Social history, and Family History were reviewed and updated.  Review of Systems: As above  Physical Exam:  weight is 133 lb (60.328 kg). Her oral temperature is 97.4 F (36.3 C). Her blood pressure is 124/72 and her pulse is 76.   Petite but well-nourished white female in no obvious distress. Head and exam has no ocular or oral lesions. She has no scleral icterus. There is no adenopathy in the neck.  Lungs are clear. Cardiac exam regular rate and rhythm with no murmurs rubs or bruits. Abdomen is soft. She has good bowel sounds. There is no fluid wave. There is no palpable liver or spleen tip. Back exam no tenderness over the spine ribs or hips. Extremities shows no clubbing cyanosis or edema. Neurological exam shows no focal neurological deficits. Skin exam no rashes, course or petechia. Lab Results  Component Value Date   WBC 4.3 04/09/2015   HGB 9.3* 04/09/2015   HCT 26.6* 04/09/2015   MCV 96 04/09/2015   PLT 332 04/09/2015     Chemistry      Component Value Date/Time   NA 135 04/23/2014 0922   NA 133 02/13/2014 1108   K 4.6 04/23/2014 0922   K 4.9* 02/13/2014 1108   CL 100 04/23/2014 0922   CL 97* 02/13/2014 1108   CO2 27 04/23/2014 0922   CO2 32 02/13/2014 1108   BUN 11 04/23/2014 0922   BUN 13 02/13/2014 1108   CREATININE 0.72 04/23/2014 0922   CREATININE 0.4* 02/13/2014 1108      Component Value Date/Time   CALCIUM 9.4 04/23/2014 0922   CALCIUM 9.3 02/13/2014 1108   ALKPHOS 71 04/23/2014 0922   ALKPHOS 63 02/13/2014 1108   AST 19 04/23/2014 0922   AST 17 02/13/2014 1108   ALT 15 04/23/2014 0922   ALT 15 02/13/2014 1108  BILITOT 1.6* 04/23/2014 0922   BILITOT 2.30* 02/13/2014 1108         Impression and Plan: Christine [name] is 69 year old female with cold agglutinin disease. We'll we did a flow cytometry evaluation on her, she was found to have a monoclonal population of cells. These were felt to be lymphoma cells.  On a bone marrow biopsy that we did, there was no obvious lympho-proliferative process. There was a "minor" population of B cells. They expressed CD5. However, no lymphoma was identified.  We did go ahead and give her Rituxan. This has helped.  She looks quite good. She is taking folic acid. She is active.  With the warmer weather coming, she will definitely improve her blood counts.  I want to see her back in about 4 months. This will get her  through summer time.  I spent about 35 minutes with her today.   Volanda Napoleon, MD 4/28/20165:17 PM

## 2015-04-10 LAB — IRON AND TIBC CHCC
%SAT: 40 % (ref 21–57)
Iron: 72 ug/dL (ref 41–142)
TIBC: 181 ug/dL — ABNORMAL LOW (ref 236–444)
UIBC: 109 ug/dL — ABNORMAL LOW (ref 120–384)

## 2015-04-10 LAB — TSH CHCC: TSH: 1.436 m(IU)/L (ref 0.308–3.960)

## 2015-04-10 LAB — FERRITIN CHCC: FERRITIN: 412 ng/mL — AB (ref 9–269)

## 2015-04-13 ENCOUNTER — Encounter: Payer: Self-pay | Admitting: *Deleted

## 2015-04-14 LAB — RETICULOCYTES (CHCC)
ABS RETIC: 110.5 10*3/uL (ref 19.0–186.0)
RBC.: 3.07 MIL/uL — AB (ref 3.87–5.11)
Retic Ct Pct: 3.6 % — ABNORMAL HIGH (ref 0.4–2.3)

## 2015-04-14 LAB — HAPTOGLOBIN

## 2015-04-14 LAB — COLD AGGLUTININ TITER

## 2015-04-22 ENCOUNTER — Encounter: Payer: Self-pay | Admitting: Internal Medicine

## 2015-07-07 ENCOUNTER — Encounter: Payer: Self-pay | Admitting: Internal Medicine

## 2015-07-07 ENCOUNTER — Other Ambulatory Visit (INDEPENDENT_AMBULATORY_CARE_PROVIDER_SITE_OTHER): Payer: Medicare Other

## 2015-07-07 ENCOUNTER — Ambulatory Visit (INDEPENDENT_AMBULATORY_CARE_PROVIDER_SITE_OTHER): Payer: Medicare Other | Admitting: Internal Medicine

## 2015-07-07 ENCOUNTER — Other Ambulatory Visit: Payer: Medicare Other

## 2015-07-07 VITALS — BP 108/68 | HR 68 | Ht 64.0 in | Wt 136.4 lb

## 2015-07-07 DIAGNOSIS — R1084 Generalized abdominal pain: Secondary | ICD-10-CM

## 2015-07-07 DIAGNOSIS — IMO0001 Reserved for inherently not codable concepts without codable children: Secondary | ICD-10-CM

## 2015-07-07 DIAGNOSIS — R140 Abdominal distension (gaseous): Secondary | ICD-10-CM

## 2015-07-07 DIAGNOSIS — R143 Flatulence: Secondary | ICD-10-CM

## 2015-07-07 LAB — CBC WITH DIFFERENTIAL/PLATELET
BASOS PCT: 1 % (ref 0–1)
Basophils Absolute: 0.1 10*3/uL (ref 0.0–0.1)
EOS PCT: 1 % (ref 0–5)
Eosinophils Absolute: 0.1 10*3/uL (ref 0.0–0.7)
HCT: 28.4 % — ABNORMAL LOW (ref 36.0–46.0)
HEMOGLOBIN: 9.8 g/dL — AB (ref 12.0–15.0)
LYMPHS ABS: 1.1 10*3/uL (ref 0.7–4.0)
Lymphocytes Relative: 21 % (ref 12–46)
MCH: 31.2 pg (ref 26.0–34.0)
MCHC: 34.5 g/dL (ref 30.0–36.0)
MCV: 90.4 fL (ref 78.0–100.0)
MPV: 8.8 fL (ref 8.6–12.4)
Monocytes Absolute: 0.5 10*3/uL (ref 0.1–1.0)
Monocytes Relative: 9 % (ref 3–12)
NEUTROS PCT: 68 % (ref 43–77)
Neutro Abs: 3.4 10*3/uL (ref 1.7–7.7)
Platelets: 320 10*3/uL (ref 150–400)
RBC: 3.14 MIL/uL — AB (ref 3.87–5.11)
RDW: 15.4 % (ref 11.5–15.5)
WBC: 5 10*3/uL (ref 4.0–10.5)

## 2015-07-07 LAB — IBC PANEL
Iron: 63 ug/dL (ref 42–145)
Saturation Ratios: 21.8 % (ref 20.0–50.0)
Transferrin: 206 mg/dL — ABNORMAL LOW (ref 212.0–360.0)

## 2015-07-07 LAB — SEDIMENTATION RATE: Sed Rate: 51 mm/hr — ABNORMAL HIGH (ref 0–22)

## 2015-07-07 LAB — VITAMIN B12: VITAMIN B 12: 334 pg/mL (ref 211–911)

## 2015-07-07 MED ORDER — RANITIDINE HCL 150 MG PO TABS: 150.0000 mg | ORAL_TABLET | Freq: Every day | ORAL | Status: DC

## 2015-07-07 MED ORDER — LUBIPROSTONE 8 MCG PO CAPS: 8.0000 ug | ORAL_CAPSULE | Freq: Two times a day (BID) | ORAL | Status: DC

## 2015-07-07 NOTE — Patient Instructions (Addendum)
Take your probiotic daily.   Your physician has requested that you go to the basement for lab work before leaving today.  We have sent the following medications to your pharmacy for you to pick up at your convenience: ranitidine, Amitiza.  cc: Marton Redwood, MD,Dr Marin Olp

## 2015-07-07 NOTE — Progress Notes (Signed)
Christine [name] 07-13-1946 [id number]  Note: This dictation was prepared with Dragon digital system. Any transcriptional errors that result from this procedure are unintentional.   History of Present Illness: This is a 69 year old white female presenting with an episode of left lower quadrant abdominal pain which occurred  about 2 months ago and lasted several weeks and was treated with the combination of Cipro and Flagyl for 2 weeks and again for 2 weeks afterwards. The pain has now resolved but she has bloating, gas and constipation. Her bowel habits are actually irregular in that she has occasional breakthrough diarrhea. Last colonoscopy in November 2011 showed tubular adenoma and diverticulosis. She was diagnosed with myeloproliferative disease by Dr.Ennever and has positive cold agglutinins. She has chronic anemia. CT scan of the abdomen in March 2015 did not show any active disease. Her weight has been stable. 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    Past Medical History  Diagnosis Date  . Hypertension   . Deep venous thrombosis of calf   . Arthritis     hands  . History of blood transfusion 2009  . PONV (postoperative nausea and vomiting)     nausea only yrs ago  . 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 Diverticulosis   . Tubular adenoma of colon 2011  . Osteopenia   . Depression   . Anxiety   . Cold agglutinin disease     Past Surgical History  Procedure Laterality Date  . Abdominal hysterectomy  age 26  . Cataract surgery Bilateral 2011  . Cholecystectomy N/A 09/20/2013    Procedure: LAPAROSCOPIC CHOLECYSTECTOMY WITH INTRAOPERATIVE CHOLANGIOGRAM;  Surgeon: Odis Hollingshead, MD;  Location: [facility];  Service: General;  Laterality: N/A;  . Replacement total knee Left     Allergies  Allergen Reactions  . Bee Venom Shortness Of Breath    Family history and social history have been reviewed.  Review of Systems: Irregular bowel habits.  Denies rectal bleeding  The remainder of the 10 point ROS is negative except as outlined in the H&P  Physical Exam: General Appearance Well developed, in no distress Eyes  Non icteric  HEENT  Non traumatic, normocephalic  Mouth No lesion, tongue papillated, no cheilosis Neck Supple without adenopathy, thyroid not enlarged, no carotid bruits, no JVD Lungs Clear to auscultation bilaterally COR Normal S1, normal S2, regular rhythm, no murmur, quiet precordium Abdomen soft minimally tender in left lower quadrant. No rebound or fullness. Liver edge at costal margin. No CVA tenderness. Rectal normal rectal sphincter tone. Small amount of Hemoccult-negative stool Extremities  No pedal edema Skin No lesions Neurological Alert and oriented x 3 Psychological Normal mood and affect  Assessment and Plan:   69 year old white female who has symptomatic diverticulosis and IBS and now had an attack of left lower quadrant abdominal pain possibly diagnosed as diverticulitis. Her symptoms are resolved now but she still has a lot of bloating, gas and irregular bowel habits. The differential diagnosis includes constipation or irritable bowel syndrome. She is Hemoccult-negative.  She will be due for recall colonoscopy in November 2016. We will check sprue profile today as well as sedimentation rate, CBC, iron studies and B12. We will start Amitiza 8 g twice a day , # 60 with 3 refills, probiotics 1 by mouth daily  Dyspepsia. We'll start ranitidine 150 mg daily. She had laparoscopic cholecystectomy in October 2014.  Myeloproliferative disease followed by Dr.Ennever History of  adenomatous polyps. Recall  colonoscopy in November 2016, recall letter will go out in September 2016    Delfin Edis 07/07/2015

## 2015-07-08 ENCOUNTER — Telehealth: Payer: Self-pay | Admitting: Internal Medicine

## 2015-07-08 LAB — CELIAC PANEL 10
ENDOMYSIAL SCREEN: NEGATIVE
GLIADIN IGG: 3 U (ref <20)
Gliadin IgA: 5 Units (ref <20)
IgA: 229 mg/dL (ref 69–380)
Tissue Transglut Ab: 1 U/mL (ref <6)
Tissue Transglutaminase Ab, IgA: 1 U/mL (ref <4)

## 2015-07-09 NOTE — Telephone Encounter (Signed)
I spoke to [facility] about this yesterday.

## 2015-07-10 ENCOUNTER — Encounter: Payer: Self-pay | Admitting: Internal Medicine

## 2015-07-10 MED ORDER — CILIDINIUM-CHLORDIAZEPOXIDE 2.5-5 MG PO CAPS: 1.0000 | ORAL_CAPSULE | Freq: Two times a day (BID) | ORAL | Status: DC

## 2015-07-10 NOTE — Telephone Encounter (Signed)
error 

## 2015-07-10 NOTE — Telephone Encounter (Signed)
-----   Message from Lafayette Dragon, MD sent at 07/10/2015  1:51 PM EDT ----- Regarding: Librax Please send Librax ( generic), #60, a po bid for IBS, 2 refills.

## 2015-07-10 NOTE — Telephone Encounter (Signed)
Dr Olevia Perches, insurance has denied Amitiza. Could we try Linzess instead?

## 2015-07-13 ENCOUNTER — Encounter: Payer: Self-pay | Admitting: Internal Medicine

## 2015-07-13 ENCOUNTER — Telehealth: Payer: Self-pay | Admitting: Hematology & Oncology

## 2015-07-13 ENCOUNTER — Telehealth: Payer: Self-pay

## 2015-07-13 MED ORDER — DICYCLOMINE HCL 10 MG PO CAPS: 10.0000 mg | ORAL_CAPSULE | Freq: Two times a day (BID) | ORAL | Status: DC | PRN

## 2015-07-13 NOTE — Telephone Encounter (Signed)
-----   Message from Lafayette Dragon, MD sent at 07/13/2015  2:44 PM EDT ----- Regarding: Bentyl 20 mg Regina, could you, please, send Bentyl 20 mg, #30, 1 po bid. 1 refill

## 2015-07-13 NOTE — Telephone Encounter (Signed)
LT MESS REGARDING CHANGE OF APPT FROM 8/25 TO 8/31

## 2015-07-13 NOTE — Telephone Encounter (Signed)
Script sent to pharmacy.

## 2015-08-06 ENCOUNTER — Other Ambulatory Visit: Payer: Medicare Other

## 2015-08-06 ENCOUNTER — Ambulatory Visit: Payer: Medicare Other | Admitting: Hematology & Oncology

## 2015-08-12 ENCOUNTER — Encounter: Payer: Self-pay | Admitting: Hematology & Oncology

## 2015-08-12 ENCOUNTER — Other Ambulatory Visit (HOSPITAL_BASED_OUTPATIENT_CLINIC_OR_DEPARTMENT_OTHER): Payer: Medicare Other

## 2015-08-12 ENCOUNTER — Ambulatory Visit (HOSPITAL_BASED_OUTPATIENT_CLINIC_OR_DEPARTMENT_OTHER): Payer: Medicare Other | Admitting: Hematology & Oncology

## 2015-08-12 VITALS — BP 135/66 | HR 65 | Temp 97.4°F | Resp 18 | Ht 64.0 in | Wt 136.0 lb

## 2015-08-12 DIAGNOSIS — D509 Iron deficiency anemia, unspecified: Secondary | ICD-10-CM | POA: Diagnosis not present

## 2015-08-12 DIAGNOSIS — D591 Other autoimmune hemolytic anemias: Secondary | ICD-10-CM

## 2015-08-12 DIAGNOSIS — D5912 Cold autoimmune hemolytic anemia: Secondary | ICD-10-CM

## 2015-08-12 DIAGNOSIS — G4701 Insomnia due to medical condition: Secondary | ICD-10-CM

## 2015-08-12 LAB — CBC WITH DIFFERENTIAL (CANCER CENTER ONLY)
BASO#: 0 10*3/uL (ref 0.0–0.2)
BASO%: 1 % (ref 0.0–2.0)
EOS%: 1.5 % (ref 0.0–7.0)
Eosinophils Absolute: 0.1 10*3/uL (ref 0.0–0.5)
HCT: 25.7 % — ABNORMAL LOW (ref 34.8–46.6)
HEMOGLOBIN: 9.2 g/dL — AB (ref 11.6–15.9)
LYMPH#: 0.8 10*3/uL — ABNORMAL LOW (ref 0.9–3.3)
LYMPH%: 18.4 % (ref 14.0–48.0)
MCH: 35.1 pg — ABNORMAL HIGH (ref 26.0–34.0)
MCHC: 35.8 g/dL (ref 32.0–36.0)
MCV: 98 fL (ref 81–101)
MONO#: 0.3 10*3/uL (ref 0.1–0.9)
MONO%: 6.1 % (ref 0.0–13.0)
NEUT%: 73 % (ref 39.6–80.0)
NEUTROS ABS: 3 10*3/uL (ref 1.5–6.5)
PLATELETS: 284 10*3/uL (ref 145–400)
RBC: 2.62 10*6/uL — AB (ref 3.70–5.32)
RDW: 15.6 % (ref 11.1–15.7)
WBC: 4.1 10*3/uL (ref 3.9–10.0)

## 2015-08-12 MED ORDER — TRAZODONE HCL 50 MG PO TABS: 50.0000 mg | ORAL_TABLET | Freq: Every day | ORAL | Status: DC

## 2015-08-12 NOTE — Progress Notes (Signed)
Hematology and Oncology Follow Up Visit  Christine [name] [id number] 1946/01/14 69 y.o. 08/12/2015   Principle Diagnosis:   Cold agglutinin disease  Lymphoproliferative disorder  Current Therapy:    Status post 1 four-week cycle of Rituxan  Folic acid 1 mg by mouth daily     Interim History:  Ms.  [name] is back for followup. She has had a good summer. She's been quite busy. She's working. 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 If she is exercising. She by tries go to the gym 2 or 3 times a week.  She's had no problem with fatigue.  She's had no bleeding.  There's been no change in bowel or bladder habits.  She still gets her mammograms.  She's had no leg swelling. She's had no rashes.  There's been no fever. She's had no issues with infections.  Overall, her performance status is ECOG 0. .   Medications:  Current outpatient prescriptions:  .  buPROPion (WELLBUTRIN XL) 300 MG 24 hr tablet, Take 300 mg by mouth every morning. , Disp: , Rfl:  .  folic acid (FOLVITE) 1 MG tablet, Take 2 tablets (2 mg total) by mouth daily., Disp: 60 tablet, Rfl: 12 .  losartan-hydrochlorothiazide (HYZAAR) 100-25 MG per tablet, Take 1 tablet by mouth every morning., Disp: , Rfl:  .  clidinium-chlordiazePOXIDE (LIBRAX) 5-2.5 MG per capsule, Take 1 capsule by mouth 2 (two) times daily. (Patient not taking: Reported on 08/12/2015), Disp: 60 capsule, Rfl: 2 .  dicyclomine (BENTYL) 10 MG capsule, Take 1 capsule (10 mg total) by mouth 2 (two) times daily as needed for spasms. (Patient not taking: Reported on 08/12/2015), Disp: 30 capsule, Rfl: 1 .  EPIPEN 2-PAK 0.3 MG/0.3ML SOAJ injection, , Disp: , Rfl: 6 .  levothyroxine (SYNTHROID, LEVOTHROID) 50 MCG tablet, Take 50 mcg by mouth daily., Disp: , Rfl: 6 .  lubiprostone (AMITIZA) 8 MCG capsule, Take 1 capsule (8 mcg total) by mouth 2 (two) times daily with a meal. (Patient not taking: Reported on 08/12/2015), Disp: 60 capsule, Rfl: 11 .   ranitidine (ZANTAC) 150 MG tablet, Take 1 tablet (150 mg total) by mouth daily. (Patient not taking: Reported on 08/12/2015), Disp: 30 tablet, Rfl: 11 .  traZODone (DESYREL) 50 MG tablet, Take 1 tablet (50 mg total) by mouth at bedtime., Disp: 30 tablet, Rfl: 3  Allergies:  Allergies  Allergen Reactions  . Bee Venom Shortness Of Breath    Past Medical History, Surgical history, Social history, and Family History were reviewed and updated.  Review of Systems: As above  Physical Exam:  height is 5' 4"  (1.626 m) and weight is 136 lb (61.689 kg). Her oral temperature is 97.4 F (36.3 C). Her blood pressure is 135/66 and her pulse is 65. Her respiration is 18.   Petite but well-nourished white female in no obvious distress. Head and exam has no ocular or oral lesions. She has no scleral icterus. There is no adenopathy in the neck. Lungs are clear. Cardiac exam regular rate and rhythm with no murmurs rubs or bruits. Abdomen is soft. She has good bowel sounds. There is no fluid wave. There is no palpable liver or spleen tip. Back exam no tenderness over the spine ribs or hips. Extremities shows no clubbing cyanosis or edema. Neurological exam shows no focal neurological deficits. Skin exam no rashes, course or petechia. Lab Results  Component Value Date   WBC 4.1 08/12/2015   HGB 9.2* 08/12/2015   HCT 25.7*  08/12/2015   MCV 98 08/12/2015   PLT 284 08/12/2015     Chemistry      Component Value Date/Time   NA 135 04/23/2014 0922   NA 133 02/13/2014 1108   K 4.6 04/23/2014 0922   K 4.9* 02/13/2014 1108   CL 100 04/23/2014 0922   CL 97* 02/13/2014 1108   CO2 27 04/23/2014 0922   CO2 32 02/13/2014 1108   BUN 11 04/23/2014 0922   BUN 13 02/13/2014 1108   CREATININE 0.72 04/23/2014 0922   CREATININE 0.4* 02/13/2014 1108      Component Value Date/Time   CALCIUM 9.4 04/23/2014 0922   CALCIUM 9.3 02/13/2014 1108   ALKPHOS 71 04/23/2014 0922   ALKPHOS 63 02/13/2014 1108   AST 19  04/23/2014 0922   AST 17 02/13/2014 1108   ALT 15 04/23/2014 0922   ALT 15 02/13/2014 1108   BILITOT 1.6* 04/23/2014 0922   BILITOT 2.30* 02/13/2014 1108         Impression and Plan: Christine [name] is 69 year old female with cold agglutinin disease. We'll we did a flow cytometry evaluation on her, she was found to have a monoclonal population of cells. These were felt to be lymphoma cells.  On a bone marrow biopsy that we did, there was no obvious lympho-proliferative process. There was a "minor" population of B cells. They expressed CD5. However, no lymphoma was identified.  We did go ahead and give her Rituxan. This has helped.  She looks quite good. She is taking folic acid. She is active.  It will be very interesting to see what happens with the colder months.  I told her to try to exercise as much as possible as this will help increase her core body temperature which will help with her hemolysis and decreasing the homolysis.  I will plan to see her back in 4 months. I spent about 35 minutes with her today.   Volanda Napoleon, MD 8/31/20164:22 PM

## 2015-08-15 LAB — RETICULOCYTES (CHCC)
ABS Retic: 112.1 10*3/uL (ref 19.0–186.0)
RBC.: 3.03 MIL/uL — AB (ref 3.87–5.11)
Retic Ct Pct: 3.7 % — ABNORMAL HIGH (ref 0.4–2.3)

## 2015-08-15 LAB — HAPTOGLOBIN

## 2015-08-15 LAB — COLD AGGLUTININ TITER: Cold Agglutinin Titer: 1:20480 {titer} — AB

## 2015-11-23 ENCOUNTER — Other Ambulatory Visit (HOSPITAL_BASED_OUTPATIENT_CLINIC_OR_DEPARTMENT_OTHER): Payer: Medicare Other

## 2015-11-23 ENCOUNTER — Ambulatory Visit (HOSPITAL_BASED_OUTPATIENT_CLINIC_OR_DEPARTMENT_OTHER): Payer: Medicare Other | Admitting: Hematology & Oncology

## 2015-11-23 ENCOUNTER — Encounter: Payer: Self-pay | Admitting: Hematology & Oncology

## 2015-11-23 VITALS — BP 113/63 | HR 85 | Temp 98.0°F | Resp 18 | Ht 64.0 in

## 2015-11-23 DIAGNOSIS — G4701 Insomnia due to medical condition: Secondary | ICD-10-CM

## 2015-11-23 DIAGNOSIS — D591 Other autoimmune hemolytic anemias: Secondary | ICD-10-CM

## 2015-11-23 DIAGNOSIS — D509 Iron deficiency anemia, unspecified: Secondary | ICD-10-CM

## 2015-11-23 DIAGNOSIS — D5912 Cold autoimmune hemolytic anemia: Secondary | ICD-10-CM

## 2015-11-23 LAB — CBC WITH DIFFERENTIAL (CANCER CENTER ONLY)
BASO#: 0 10*3/uL (ref 0.0–0.2)
BASO%: 0.9 % (ref 0.0–2.0)
EOS ABS: 0.1 10*3/uL (ref 0.0–0.5)
EOS%: 2.1 % (ref 0.0–7.0)
HEMATOCRIT: 26.5 % — AB (ref 34.8–46.6)
HEMOGLOBIN: 9.1 g/dL — AB (ref 11.6–15.9)
LYMPH#: 0.9 10*3/uL (ref 0.9–3.3)
LYMPH%: 20 % (ref 14.0–48.0)
MCH: 33.5 pg (ref 26.0–34.0)
MCHC: 34.3 g/dL (ref 32.0–36.0)
MCV: 97 fL (ref 81–101)
MONO#: 0.3 10*3/uL (ref 0.1–0.9)
MONO%: 8 % (ref 0.0–13.0)
NEUT#: 2.9 10*3/uL (ref 1.5–6.5)
NEUT%: 69 % (ref 39.6–80.0)
PLATELETS: 310 10*3/uL (ref 145–400)
RBC: 2.72 10*6/uL — AB (ref 3.70–5.32)
RDW: 15.3 % (ref 11.1–15.7)
WBC: 4.2 10*3/uL (ref 3.9–10.0)

## 2015-11-23 LAB — CHCC SATELLITE - SMEAR

## 2015-11-23 NOTE — Progress Notes (Signed)
Hematology and Oncology Follow Up Visit  Christine [name] [id number] 09/03/1946 69 y.o. 11/23/2015   Principle Diagnosis:   Cold agglutinin disease  Lymphoproliferative disorder  Current Therapy:    Status post 1 four-week cycle of Rituxan - completed in April [id number]  Folic acid 1 mg by mouth daily     Interim History:  Ms.  Depaola is back for followup. She is really having self time right now. Her son died unexpectedly a couple months ago. I called her about this. It just is very very sad how things happened. I truly empathize with her.  She really does not have much energy. She does feel so "down" from her son's death. She's not working out. She is still working, but probably part-time.  She's had no obvious bleeding. She's had no fever. She's had no change in bowel bladder habits.  She's had no fever. There's been no cough or shortness of breath..  Overall, her performance status is ECOG 1. .   Medications:  Current outpatient prescriptions:  .  buPROPion (WELLBUTRIN XL) 300 MG 24 hr tablet, Take 300 mg by mouth every morning. , Disp: , Rfl:  .  clidinium-chlordiazePOXIDE (LIBRAX) 5-2.5 MG per capsule, Take 1 capsule by mouth 2 (two) times daily., Disp: 60 capsule, Rfl: 2 .  dicyclomine (BENTYL) 10 MG capsule, Take 1 capsule (10 mg total) by mouth 2 (two) times daily as needed for spasms., Disp: 30 capsule, Rfl: 1 .  EPIPEN 2-PAK 0.3 MG/0.3ML SOAJ injection, , Disp: , Rfl: 6 .  folic acid (FOLVITE) 1 MG tablet, Take 2 tablets (2 mg total) by mouth daily., Disp: 60 tablet, Rfl: 12 .  levothyroxine (SYNTHROID, LEVOTHROID) 50 MCG tablet, Take 50 mcg by mouth daily., Disp: , Rfl: 6 .  losartan-hydrochlorothiazide (HYZAAR) 100-25 MG per tablet, Take 1 tablet by mouth every morning., Disp: , Rfl:  .  lubiprostone (AMITIZA) 8 MCG capsule, Take 1 capsule (8 mcg total) by mouth 2 (two) times daily with a meal., Disp: 60 capsule, Rfl: 11 .  ranitidine (ZANTAC) 150 MG tablet, Take 1  tablet (150 mg total) by mouth daily., Disp: 30 tablet, Rfl: 11 .  traZODone (DESYREL) 50 MG tablet, Take 1 tablet (50 mg total) by mouth at bedtime., Disp: 30 tablet, Rfl: 3  Allergies:  Allergies  Allergen Reactions  . Bee Venom Shortness Of Breath    Past Medical History, Surgical history, Social history, and Family History were reviewed and updated.  Review of Systems: As above  Physical Exam:  height is 5\' 4"  (1.626 m). Her oral temperature is 98 F (36.7 C). Her blood pressure is 113/63 and her pulse is 85. Her respiration is 18.   Petite but well-nourished white female in no obvious distress. Head and exam has no ocular or oral lesions. She has no scleral icterus. There is no adenopathy in the neck. Lungs are clear. Cardiac exam regular rate and rhythm with no murmurs rubs or bruits. Abdomen is soft. She has good bowel sounds. There is no fluid wave. There is no palpable liver or spleen tip. Back exam no tenderness over the spine ribs or hips. Extremities shows no clubbing cyanosis or edema. Neurological exam shows no focal neurological deficits. Skin exam no rashes, course or petechia. Lab Results  Component Value Date   WBC 4.2 11/23/2015   HGB 9.1* 11/23/2015   HCT 26.5* 11/23/2015   MCV 97 11/23/2015   PLT 310 11/23/2015     Chemistry  Component Value Date/Time   NA 135 04/23/2014 0922   NA 133 02/13/2014 1108   K 4.6 04/23/2014 0922   K 4.9* 02/13/2014 1108   CL 100 04/23/2014 0922   CL 97* 02/13/2014 1108   CO2 27 04/23/2014 0922   CO2 32 02/13/2014 1108   BUN 11 04/23/2014 0922   BUN 13 02/13/2014 1108   CREATININE 0.72 04/23/2014 0922   CREATININE 0.4* 02/13/2014 1108      Component Value Date/Time   CALCIUM 9.4 04/23/2014 0922   CALCIUM 9.3 02/13/2014 1108   ALKPHOS 71 04/23/2014 0922   ALKPHOS 63 02/13/2014 1108   AST 19 04/23/2014 0922   AST 17 02/13/2014 1108   ALT 15 04/23/2014 0922   ALT 15 02/13/2014 1108   BILITOT 1.6* 04/23/2014 0922    BILITOT 2.30* 02/13/2014 1108         Impression and Plan: [name] is a 68 year old white female with cold agglutinin disease. She probably has underlying lymphoproliferative disorder by flow cytometry. We treated her with 1 course of Rituxan. She finishes back and April 2015.  Her hemoglobin is slowly trending downward. She is asymptomatic. She really is emotionally distraught over the untimely death of her son. I can certainly understand this.  [place], she will be able to exercise a little bit more. I think the exercise will increase her core body temperature and this may help her hemoglobin a little bit.  I don't think we have to do a bone marrow test on her.  I will like to see her back in 4 months. I told her make sure that she dresses very warmly during the winter and that she wear gloves and a scar and a hat whenever the temperature gets below 40.   I spent about 30 minutes with her today talking to her about her son's death.  Volanda Napoleon, MD 28-Dec-20165:46 PM

## 2015-11-24 LAB — FERRITIN: FERRITIN: 352 ng/mL — AB (ref 9–269)

## 2015-11-24 LAB — IRON AND TIBC
%SAT: 36 % (ref 21–57)
IRON: 85 ug/dL (ref 41–142)
TIBC: 233 ug/dL — ABNORMAL LOW (ref 236–444)
UIBC: 148 ug/dL (ref 120–384)

## 2015-11-26 LAB — HAPTOGLOBIN: Haptoglobin: <15 mg/dL — ABNORMAL LOW (ref 43–212)

## 2015-11-26 LAB — COLD AGGLUTININ TITER: Cold Agglutinin Titer: 1:20480 {titer} — AB

## 2015-11-26 LAB — RETICULOCYTES
ABS Retic: 155.3 10*3/uL (ref 19.0–186.0)
RBC.: 2.93 MIL/uL — ABNORMAL LOW (ref 3.87–5.11)
Retic Ct Pct: 5.3 % — ABNORMAL HIGH (ref 0.4–2.3)

## 2015-12-25 ENCOUNTER — Encounter: Payer: Self-pay | Admitting: Gastroenterology

## 2016-01-08 ENCOUNTER — Other Ambulatory Visit: Payer: Self-pay | Admitting: *Deleted

## 2016-01-08 DIAGNOSIS — D591 Other autoimmune hemolytic anemias: Secondary | ICD-10-CM

## 2016-01-08 DIAGNOSIS — D5912 Cold autoimmune hemolytic anemia: Secondary | ICD-10-CM

## 2016-01-08 DIAGNOSIS — D509 Iron deficiency anemia, unspecified: Secondary | ICD-10-CM

## 2016-01-08 DIAGNOSIS — G4701 Insomnia due to medical condition: Secondary | ICD-10-CM

## 2016-01-08 MED ORDER — TRAZODONE HCL 50 MG PO TABS: 50.0000 mg | ORAL_TABLET | Freq: Every day | ORAL | Status: DC

## 2016-02-03 ENCOUNTER — Ambulatory Visit (HOSPITAL_COMMUNITY)
Admission: RE | Admit: 2016-02-03 | Discharge: 2016-02-03 | Disposition: A | Discharge status: Home / Self Care | Payer: Medicare Other | Source: Ambulatory Visit | Attending: Internal Medicine | Admitting: Internal Medicine

## 2016-02-03 ENCOUNTER — Other Ambulatory Visit (HOSPITAL_COMMUNITY): Payer: Self-pay | Admitting: Internal Medicine

## 2016-02-03 ENCOUNTER — Encounter (HOSPITAL_COMMUNITY): Payer: Self-pay

## 2016-02-03 DIAGNOSIS — M810 Age-related osteoporosis without current pathological fracture: Secondary | ICD-10-CM | POA: Insufficient documentation

## 2016-02-03 MED ORDER — SODIUM CHLORIDE 0.9 % IV SOLN
Freq: Once | INTRAVENOUS | Status: AC
  Administered 2016-02-03: 13:00:00 via INTRAVENOUS

## 2016-02-03 MED ORDER — ZOLEDRONIC ACID 5 MG/100ML IV SOLN
5.0000 mg | Freq: Once | INTRAVENOUS | Status: AC
  Administered 2016-02-03: 5 mg via INTRAVENOUS
  Filled 2016-02-03: qty 100

## 2016-02-03 NOTE — Progress Notes (Signed)
VSS, pt. Drinking coke, pt. Belching, indigestion better, Pt. Stated " I only had coffee this morning, and had something light and a roll for lunch, I was rushing to get here".

## 2016-02-03 NOTE — Progress Notes (Signed)
Reclast completed, pt. C/o digestion, vss, line flushed with nss, pt. Denies and SOB, no arm pain, pt. Drinking water.

## 2016-02-03 NOTE — Discharge Instructions (Signed)

## 2016-02-03 NOTE — Progress Notes (Signed)
Vss, continuing to flush line with nss, pt. Still c/o slight indigestion, pt. Drinking coke.

## 2016-02-03 NOTE — Progress Notes (Signed)
VSS, pt. Denies any chest pain, sob,arm pain, pt. Denies any further indigestion, pt. Instructed to follow-up with MD with any problems, pt. Verbalized understanding. Pt. Encouraged to drink lots of water for the next couple of days, take her vitamin D, and take her calcium. Pt. Verbalized understanding.j

## 2016-02-08 ENCOUNTER — Other Ambulatory Visit: Payer: Self-pay | Admitting: *Deleted

## 2016-02-08 DIAGNOSIS — D500 Iron deficiency anemia secondary to blood loss (chronic): Secondary | ICD-10-CM

## 2016-02-08 MED ORDER — FOLIC ACID 1 MG PO TABS: 2.0000 mg | ORAL_TABLET | Freq: Every day | ORAL | Status: DC

## 2016-02-09 ENCOUNTER — Ambulatory Visit (AMBULATORY_SURGERY_CENTER): Payer: Self-pay

## 2016-02-09 VITALS — Ht 65.0 in | Wt 136.6 lb

## 2016-02-09 DIAGNOSIS — Z86010 Personal history of colonic polyps: Secondary | ICD-10-CM

## 2016-02-09 NOTE — Progress Notes (Signed)
Per pt, no allergies to soy or egg products.Pt not taking any weight loss meds or using  O2 at home. 

## 2016-02-23 ENCOUNTER — Ambulatory Visit (AMBULATORY_SURGERY_CENTER): Payer: Medicare Other | Admitting: Gastroenterology

## 2016-02-23 ENCOUNTER — Encounter: Payer: Self-pay | Admitting: Gastroenterology

## 2016-02-23 VITALS — BP 144/74 | HR 69 | Temp 99.3°F | Resp 14 | Ht 65.0 in | Wt 136.0 lb

## 2016-02-23 DIAGNOSIS — D124 Benign neoplasm of descending colon: Secondary | ICD-10-CM

## 2016-02-23 DIAGNOSIS — D125 Benign neoplasm of sigmoid colon: Secondary | ICD-10-CM

## 2016-02-23 DIAGNOSIS — Z86010 Personal history of colonic polyps: Secondary | ICD-10-CM

## 2016-02-23 MED ORDER — SODIUM CHLORIDE 0.9 % IV SOLN: 500.0000 mL | INTRAVENOUS | Status: DC

## 2016-02-23 NOTE — Progress Notes (Signed)
A/ox3 pleased with MAC, report to Sheila RN 

## 2016-02-23 NOTE — Op Note (Signed)
[facility] Patient Name: Christine [name] Procedure Date: 02/23/2016 11:22 AM MRN: [id number] Endoscopist: Mauri Pole , MD Age: 70 Referring MD:  Date of Birth: 02/23/1946 Gender: Female Procedure:                Colonoscopy Indications:              Screening for colorectal malignant neoplasm Medicines:                Propofol total dose 400 mg IV, Monitored Anesthesia                            Care Procedure:                Pre-Anesthesia Assessment:                           - Prior to the procedure, a History and Physical                            was performed, and patient medications and                            allergies were reviewed. The patient's tolerance of                            previous anesthesia was also reviewed. The risks                            and benefits of the procedure and the sedation                            options and risks were discussed with the patient.                            All questions were answered, and informed consent                            was obtained. Prior Anticoagulants: The patient has                            taken no previous anticoagulant or antiplatelet                            agents. ASA Grade Assessment: II - A patient with                            mild systemic disease. After reviewing the risks                            and benefits, the patient was deemed in                            satisfactory condition to undergo the procedure.  After obtaining informed consent, the colonoscope                            was passed under direct vision. Throughout the                            procedure, the patient's blood pressure, pulse, and                            oxygen saturations were monitored continuously. The                            Model CF-HQ190L [id number]) scope was introduced                            through the anus and advanced to the the cecum,                             identified by appendiceal orifice and ileocecal                            valve. The colonoscopy was performed without                            difficulty. The patient tolerated the procedure                            well. The quality of the bowel preparation was                            good. The ileocecal valve, appendiceal orifice, and                            rectum were photographed. Scope In: 11:28:00 AM Scope Out: 11:59:54 AM Scope Withdrawal Time: 0 hours 15 minutes 52 seconds  Total Procedure Duration: 0 hours 31 minutes 54 seconds  Findings:      The perianal and digital rectal examinations were normal.      A 9 mm polyp was found in the sigmoid colon. The polyp was sessile. The       polyp was removed with a cold snare. Resection and retrieval were       complete.      A 4 mm polyp was found in the descending colon. The polyp was sessile.       The polyp was removed with a cold biopsy forceps. Resection and       retrieval were complete.      Multiple small and large-mouthed diverticula were found in the sigmoid       colon and descending colon.      Non-bleeding internal hemorrhoids were found during retroflexion. The       hemorrhoids were small. Complications:            No immediate complications. Estimated Blood Loss:     Estimated blood loss: none. Impression:               - One 9 mm polyp in  the sigmoid colon, removed with                            a cold snare. Resected and retrieved.                           - One 4 mm polyp in the descending colon, removed                            with a cold biopsy forceps. Resected and retrieved.                           - Diverticulosis in the sigmoid colon and in the                            descending colon.                           - Non-bleeding internal hemorrhoids. Recommendation:           - Patient has a contact number available for                            emergencies.  The signs and symptoms of potential                            delayed complications were discussed with the                            patient. Return to normal activities tomorrow.                            Written discharge instructions were provided to the                            patient.                           - Continue present medications.                           - Resume previous diet.                           - Continue present medications.                           - Await pathology results.                           - Repeat colonoscopy in 5 years for surveillance. Procedure Code(s):        --- Professional ---                           [phone], Colonoscopy, flexible; with removal of  tumor(s), polyp(s), or other lesion(s) by snare                            technique                           [id number], 59, Colonoscopy, flexible; with biopsy,                            single or multiple CPT copyright 2016 [employer]. All rights reserved. Mauri Pole, MD 02/23/2016 12:06:16 PM This report has been signed electronically. Number of Addenda: 0

## 2016-02-23 NOTE — Patient Instructions (Signed)
YOU HAD AN ENDOSCOPIC PROCEDURE TODAY AT [facility]:   Refer to the procedure report that was given to you for any specific questions about what was found during the examination.  If the procedure report does not answer your questions, please call your gastroenterologist to clarify.  If you requested that your care partner not be given the details of your procedure findings, then the procedure report has been included in a sealed envelope for you to review at your convenience later.  YOU SHOULD EXPECT: Some feelings of bloating in the abdomen. Passage of more gas than usual.  Walking can help get rid of the air that was put into your GI tract during the procedure and reduce the bloating. If you had a lower endoscopy (such as a colonoscopy or flexible sigmoidoscopy) you may notice spotting of blood in your stool or on the toilet paper. If you underwent a bowel prep for your procedure, you may not have a normal bowel movement for a few days.  Please Note:  You might notice some irritation and congestion in your nose or some drainage.  This is from the oxygen used during your procedure.  There is no need for concern and it should clear up in a day or so.  SYMPTOMS TO REPORT IMMEDIATELY:   Following lower endoscopy (colonoscopy or flexible sigmoidoscopy):  Excessive amounts of blood in the stool  Significant tenderness or worsening of abdominal pains  Swelling of the abdomen that is new, acute  Fever of 100F or higher   For urgent or emergent issues, a gastroenterologist can be reached at any hour by calling [phone].   DIET: Your first meal following the procedure should be a small meal and then it is ok to progress to your normal diet. Heavy or fried foods are harder to digest and may make you feel nauseous or bloated.  Likewise, meals heavy in dairy and vegetables can increase bloating.  Drink plenty of fluids but you should avoid alcoholic beverages for 24 hours. Try to  increase the fiber in your diet.  Drink plenty of water.  ACTIVITY:  You should plan to take it easy for the rest of today and you should NOT DRIVE or use heavy machinery until tomorrow (because of the sedation medicines used during the test).    FOLLOW UP: Our staff will call the number listed on your records the next business day following your procedure to check on you and address any questions or concerns that you may have regarding the information given to you following your procedure. If we do not reach you, we will leave a message.  However, if you are feeling well and you are not experiencing any problems, there is no need to return our call.  We will assume that you have returned to your regular daily activities without incident.  If any biopsies were taken you will be contacted by phone or by letter within the next 1-3 weeks.  Please call us at [phone] if you have not heard about the biopsies in 3 weeks.    SIGNATURES/CONFIDENTIALITY: You and/or your care partner have signed paperwork which will be entered into your electronic medical record.  These signatures attest to the fact that that the information above on your After Visit Summary has been reviewed and is understood.  Full responsibility of the confidentiality of this discharge information lies with you and/or your care-partner.  Read all handouts given to you by your recovery room nurse.  Thank-you for choosing [facility] for your healthcare needs today.

## 2016-02-23 NOTE — Progress Notes (Signed)
Called to room to assist during endoscopic procedure.  Patient ID and intended procedure confirmed with present staff. Received instructions for my participation in the procedure from the performing physician.  

## 2016-02-24 ENCOUNTER — Telehealth: Payer: Self-pay

## 2016-02-24 NOTE — Telephone Encounter (Signed)
  Follow up Call-  Call back number 02/23/2016  Post procedure Call Back phone  # [phone]  Permission to leave phone message Yes     Patient questions:  Do you have a fever, pain , or abdominal swelling? No. Pain Score  0 *  Have you tolerated food without any problems? Yes.    Have you been able to return to your normal activities? Yes.    Do you have any questions about your discharge instructions: Diet   No. Medications  No. Follow up visit  No.  Do you have questions or concerns about your Care? No.  Actions: * If pain score is 4 or above: No action needed, pain <4.

## 2016-03-07 ENCOUNTER — Encounter: Payer: Self-pay | Admitting: Gastroenterology

## 2016-03-24 ENCOUNTER — Encounter: Payer: Self-pay | Admitting: Hematology & Oncology

## 2016-03-24 ENCOUNTER — Ambulatory Visit (HOSPITAL_BASED_OUTPATIENT_CLINIC_OR_DEPARTMENT_OTHER): Payer: Medicare Other | Admitting: Hematology & Oncology

## 2016-03-24 ENCOUNTER — Other Ambulatory Visit (HOSPITAL_BASED_OUTPATIENT_CLINIC_OR_DEPARTMENT_OTHER): Payer: Medicare Other

## 2016-03-24 VITALS — BP 144/80 | HR 84 | Temp 98.1°F | Resp 20 | Ht 65.0 in | Wt 133.0 lb

## 2016-03-24 DIAGNOSIS — D5912 Cold autoimmune hemolytic anemia: Secondary | ICD-10-CM

## 2016-03-24 DIAGNOSIS — D591 Other autoimmune hemolytic anemias: Secondary | ICD-10-CM

## 2016-03-24 DIAGNOSIS — G4701 Insomnia due to medical condition: Secondary | ICD-10-CM

## 2016-03-24 DIAGNOSIS — D501 Sideropenic dysphagia: Secondary | ICD-10-CM

## 2016-03-24 LAB — CBC WITH DIFFERENTIAL (CANCER CENTER ONLY)
BASO#: 0 10*3/uL (ref 0.0–0.2)
BASO%: 0.6 % (ref 0.0–2.0)
EOS%: 1.3 % (ref 0.0–7.0)
Eosinophils Absolute: 0.1 10*3/uL (ref 0.0–0.5)
HCT: 28.7 % — ABNORMAL LOW (ref 34.8–46.6)
HGB: 10 g/dL — ABNORMAL LOW (ref 11.6–15.9)
LYMPH#: 0.9 10*3/uL (ref 0.9–3.3)
LYMPH%: 19.3 % (ref 14.0–48.0)
MCH: 32.9 pg (ref 26.0–34.0)
MCHC: 34.8 g/dL (ref 32.0–36.0)
MCV: 94 fL (ref 81–101)
MONO#: 0.6 10*3/uL (ref 0.1–0.9)
MONO%: 11.9 % (ref 0.0–13.0)
NEUT#: 3.2 10*3/uL (ref 1.5–6.5)
NEUT%: 66.9 % (ref 39.6–80.0)
Platelets: 424 10*3/uL — ABNORMAL HIGH (ref 145–400)
RBC: 3.04 10*6/uL — ABNORMAL LOW (ref 3.70–5.32)
RDW: 14.4 % (ref 11.1–15.7)
WBC: 4.7 10*3/uL (ref 3.9–10.0)

## 2016-03-24 LAB — CHCC SATELLITE - SMEAR

## 2016-03-24 LAB — LACTATE DEHYDROGENASE: LDH: 225 U/L (ref 125–245)

## 2016-03-24 MED ORDER — ZOLPIDEM TARTRATE 10 MG PO TABS: 10.0000 mg | ORAL_TABLET | Freq: Every day | ORAL | Status: DC

## 2016-03-24 NOTE — Progress Notes (Signed)
Hematology and Oncology Follow Up Visit  Christine [name] [id number] Feb 21, 1946 70 y.o. 03/24/2016   Principle Diagnosis:   Cold agglutinin disease  Lymphoproliferative disorder  Current Therapy:    Status post 1 four-week cycle of Rituxan - completed in April 123456  Folic acid 1 mg by mouth daily     Interim History:  Christine [name] is back for followup. She, unfortunately, injured her right foot recently. She probably was trying to get ready for work. She had an incident at home and a heavy object fell on her right foot. She does not think that any bones are broken. She does have limited difficulty moving the fourth and fifth toes. She does not think that she needs a visit to an orthopedist.  She is doing well on the folic acid.  She still is working. She is not working out as much.   She's had no fever. She's had no cough. She had no change in bowel or bladder habits.   She recently had a colonoscopy. She had a polyp removed. This was a hyperplastic polyp. The pathology report on this was negative for malignancy.   She is due for a mammogram soon.   Overall, her performance status is ECOG 1. .   Medications:  Current outpatient prescriptions:  .  Calcium Carbonate-Vit D-Min (CALCIUM 1200 PO), Take by mouth daily., Disp: , Rfl:  .  Cholecalciferol (VITAMIN D3) 1000 units CAPS, Take 1,000 capsules by mouth daily., Disp: , Rfl:  .  DULoxetine (CYMBALTA) 60 MG capsule, Take 60 mg by mouth daily., Disp: , Rfl:  .  EPIPEN 2-PAK 0.3 MG/0.3ML SOAJ injection, as needed. Reported on 02/23/2016, Disp: , Rfl: 6 .  folic acid (FOLVITE) 1 MG tablet, Take 2 tablets (2 mg total) by mouth daily., Disp: 60 tablet, Rfl: 12 .  levothyroxine (SYNTHROID, LEVOTHROID) 50 MCG tablet, Take 50 mcg by mouth daily., Disp: , Rfl: 6 .  losartan-hydrochlorothiazide (HYZAAR) 100-25 MG per tablet, Take 1 tablet by mouth every morning., Disp: , Rfl:  .  zolpidem (AMBIEN) 10 MG tablet, Take 1 tablet (10 mg  total) by mouth at bedtime., Disp: 30 tablet, Rfl: 3 .  buPROPion (WELLBUTRIN XL) 300 MG 24 hr tablet, Take 300 mg by mouth every morning. , Disp: , Rfl:   Allergies:  Allergies  Allergen Reactions  . Bee Venom Shortness Of Breath    Past Medical History, Surgical history, Social history, and Family History were reviewed and updated.  Review of Systems: As above  Physical Exam:  height is 5\' 5"  (1.651 m) and weight is 133 lb (60.328 kg). Her oral temperature is 98.1 F (36.7 C). Her blood pressure is 144/80 and her pulse is 84. Her respiration is 20.   Petite but well-nourished white female in no obvious distress. Head and exam has no ocular or oral lesions. She has no scleral icterus. There is no adenopathy in the neck. Lungs are clear. Cardiac exam regular rate and rhythm with no murmurs rubs or bruits. Abdomen is soft. She has good bowel sounds. There is no fluid wave. There is no palpable liver or spleen tip. Back exam no tenderness over the spine ribs or hips. Extremities shows no clubbing cyanosis or edema. Neurological exam shows no focal neurological deficits. Skin exam no rashes, course or petechia. Lab Results  Component Value Date   WBC 4.7 03/24/2016   HGB 10.0* 03/24/2016   HCT 28.7* 03/24/2016   MCV 94 03/24/2016   PLT 424  Platelet count confirmed by slide estimate* 03/24/2016     Chemistry      Component Value Date/Time   NA 135 04/23/2014 0922   NA 133 02/13/2014 1108   K 4.6 04/23/2014 0922   K 4.9* 02/13/2014 1108   CL 100 04/23/2014 0922   CL 97* 02/13/2014 1108   CO2 27 04/23/2014 0922   CO2 32 02/13/2014 1108   BUN 11 04/23/2014 0922   BUN 13 02/13/2014 1108   CREATININE 0.72 04/23/2014 0922   CREATININE 0.4* 02/13/2014 1108      Component Value Date/Time   CALCIUM 9.4 04/23/2014 0922   CALCIUM 9.3 02/13/2014 1108   ALKPHOS 71 04/23/2014 0922   ALKPHOS 63 02/13/2014 1108   AST 19 04/23/2014 0922   AST 17 02/13/2014 1108   ALT 15 04/23/2014 0922    ALT 15 02/13/2014 1108   BILITOT 1.6* 04/23/2014 0922   BILITOT 2.30* 02/13/2014 1108         Impression and Plan: Christine [name] is a 70 year old white female with cold agglutinin disease. She probably has underlying lymphoproliferative disorder by flow cytometry. We treated her with 1 course of Rituxan. She finished back in April 2015.  I am not surprised that her hemoglobin is up. We do not have much of a cold winter or spring so far.   For now, as a we can probably get her back in 6 more months. I know that the death of her son really is still affecting her. I just feel so bad that she has to go through this.  We had good fellowship. I will pray for her.Volanda Napoleon, MD 4/13/201712:58 PM

## 2016-03-25 LAB — COLD AGGLUTININ TITER

## 2016-03-25 LAB — HAPTOGLOBIN: Haptoglobin: <10 mg/dL — ABNORMAL LOW (ref 34–200)

## 2016-03-25 LAB — RETICULOCYTES: Reticulocyte Count: 5.6 % — ABNORMAL HIGH (ref 0.6–2.6)

## 2016-06-20 ENCOUNTER — Other Ambulatory Visit: Payer: Self-pay | Admitting: *Deleted

## 2016-06-20 DIAGNOSIS — D591 Other autoimmune hemolytic anemias: Secondary | ICD-10-CM

## 2016-06-20 DIAGNOSIS — D501 Sideropenic dysphagia: Secondary | ICD-10-CM

## 2016-06-20 DIAGNOSIS — G4701 Insomnia due to medical condition: Secondary | ICD-10-CM

## 2016-06-20 DIAGNOSIS — D5912 Cold autoimmune hemolytic anemia: Secondary | ICD-10-CM

## 2016-06-20 MED ORDER — ZOLPIDEM TARTRATE 10 MG PO TABS: 10.0000 mg | ORAL_TABLET | Freq: Every day | ORAL | Status: DC

## 2016-09-29 ENCOUNTER — Ambulatory Visit (HOSPITAL_BASED_OUTPATIENT_CLINIC_OR_DEPARTMENT_OTHER): Payer: Medicare Other | Admitting: Hematology & Oncology

## 2016-09-29 ENCOUNTER — Encounter: Payer: Self-pay | Admitting: Hematology & Oncology

## 2016-09-29 ENCOUNTER — Other Ambulatory Visit (HOSPITAL_BASED_OUTPATIENT_CLINIC_OR_DEPARTMENT_OTHER): Payer: Medicare Other

## 2016-09-29 VITALS — BP 146/85 | HR 68 | Temp 98.0°F | Resp 16 | Ht 65.0 in | Wt 133.0 lb

## 2016-09-29 DIAGNOSIS — D5912 Cold autoimmune hemolytic anemia: Secondary | ICD-10-CM

## 2016-09-29 DIAGNOSIS — D501 Sideropenic dysphagia: Secondary | ICD-10-CM

## 2016-09-29 DIAGNOSIS — D591 Other autoimmune hemolytic anemias: Secondary | ICD-10-CM

## 2016-09-29 DIAGNOSIS — G4701 Insomnia due to medical condition: Secondary | ICD-10-CM

## 2016-09-29 LAB — CBC WITH DIFFERENTIAL (CANCER CENTER ONLY)
BASO#: 0 10*3/uL (ref 0.0–0.2)
BASO%: 1.1 % (ref 0.0–2.0)
EOS%: 1.1 % (ref 0.0–7.0)
Eosinophils Absolute: 0 10*3/uL (ref 0.0–0.5)
HEMATOCRIT: 28 % — AB (ref 34.8–46.6)
HGB: 10 g/dL — ABNORMAL LOW (ref 11.6–15.9)
LYMPH#: 0.7 10*3/uL — AB (ref 0.9–3.3)
LYMPH%: 18.9 % (ref 14.0–48.0)
MCH: 33 pg (ref 26.0–34.0)
MCHC: 35.7 g/dL (ref 32.0–36.0)
MCV: 92 fL (ref 81–101)
MONO#: 0.3 10*3/uL (ref 0.1–0.9)
MONO%: 9.2 % (ref 0.0–13.0)
NEUT#: 2.5 10*3/uL (ref 1.5–6.5)
NEUT%: 69.7 % (ref 39.6–80.0)
PLATELETS: 284 10*3/uL (ref 145–400)
RBC: 3.03 10*6/uL — ABNORMAL LOW (ref 3.70–5.32)
RDW: 13.8 % (ref 11.1–15.7)
WBC: 3.6 10*3/uL — ABNORMAL LOW (ref 3.9–10.0)

## 2016-09-29 LAB — LACTATE DEHYDROGENASE: LDH: 218 U/L (ref 125–245)

## 2016-09-29 NOTE — Progress Notes (Signed)
Yes Hematology and Oncology Follow Up Visit  Christine [name] [id number] 12/27/1945 70 y.o. 09/29/2016   Principle Diagnosis:   Cold agglutinin disease  Lymphoproliferative disorder  Current Therapy:    Status post 1 four-week cycle of Rituxan - completed in April 123456  Folic acid 1 mg by mouth daily     Interim History:  Ms.  [name] is back for followup. She has had a pretty decent summer. She is still working. She's trying to exercise. It is still a little bit tough on her with her son who passed away about a year or so ago. She is still working through this.  We saw her back in April, her cold agglutinin titer was 1:4096.  She is on her folic acid. She's having no problem with folic acid.  She's had no increasing fatigue. She's had no rashes. She's had no change in bowel or bladder habits.  There's been no cough or shortness of breath. She's had no nausea or vomiting. Her appetite has been doing pretty well.  Overall, her performance status is ECOG 1. .   Medications:  Current Outpatient Prescriptions:  .  buPROPion (WELLBUTRIN XL) 300 MG 24 hr tablet, Take 300 mg by mouth every morning. , Disp: , Rfl:  .  EPIPEN 2-PAK 0.3 MG/0.3ML SOAJ injection, as needed. Reported on 02/23/2016, Disp: , Rfl: 6 .  FLUoxetine (PROZAC) 20 MG capsule, Take by mouth daily., Disp: , Rfl: 5 .  folic acid (FOLVITE) 1 MG tablet, Take 2 tablets (2 mg total) by mouth daily., Disp: 60 tablet, Rfl: 12 .  losartan-hydrochlorothiazide (HYZAAR) 100-25 MG per tablet, Take 1 tablet by mouth every morning., Disp: , Rfl:  .  SYNTHROID 75 MCG tablet, Take 75 mcg by mouth daily., Disp: , Rfl: 11 .  zolpidem (AMBIEN) 10 MG tablet, Take 1 tablet (10 mg total) by mouth at bedtime., Disp: 30 tablet, Rfl: 3  Allergies:  Allergies  Allergen Reactions  . Bee Venom Shortness Of Breath    Past Medical History, Surgical history, Social history, and Family History were reviewed and updated.  Review of  Systems: As above  Physical Exam:  height is 5\' 5"  (1.651 m) and weight is 133 lb (60.3 kg). Her oral temperature is 98 F (36.7 C). Her blood pressure is 146/85 (abnormal) and her pulse is 68. Her respiration is 16.   Petite but well-nourished white female in no obvious distress. Head and exam has no ocular or oral lesions. She has no scleral icterus. There is no adenopathy in the neck. Lungs are clear. Cardiac exam regular rate and rhythm with no murmurs rubs or bruits. Abdomen is soft. She has good bowel sounds. There is no fluid wave. There is no palpable liver or spleen tip. Back exam no tenderness over the spine ribs or hips. Extremities shows no clubbing cyanosis or edema. Neurological exam shows no focal neurological deficits. Skin exam no rashes, course or petechia. Lab Results  Component Value Date   WBC 3.6 (L) 09/29/2016   HGB 10.0 (L) 09/29/2016   HCT 28.0 (L) 09/29/2016   MCV 92 09/29/2016   PLT 284 09/29/2016     Chemistry      Component Value Date/Time   NA 135 04/23/2014 0922   NA 133 02/13/2014 1108   K 4.6 04/23/2014 0922   K 4.9 (H) 02/13/2014 1108   CL 100 04/23/2014 0922   CL 97 (L) 02/13/2014 1108   CO2 27 04/23/2014 0922   CO2 32  02/13/2014 1108   BUN 11 04/23/2014 0922   BUN 13 02/13/2014 1108   CREATININE 0.72 04/23/2014 0922   CREATININE 0.4 (L) 02/13/2014 1108      Component Value Date/Time   CALCIUM 9.4 04/23/2014 0922   CALCIUM 9.3 02/13/2014 1108   ALKPHOS 71 04/23/2014 0922   ALKPHOS 63 02/13/2014 1108   AST 19 04/23/2014 0922   AST 17 02/13/2014 1108   ALT 15 04/23/2014 0922   ALT 15 02/13/2014 1108   BILITOT 1.6 (H) 04/23/2014 0922   BILITOT 2.30 (H) 02/13/2014 1108         Impression and Plan: Christine [name] is a 70 year old white female with cold agglutinin disease. She probably has underlying lymphoproliferative disorder by flow cytometry. We treated her with 1 course of Rituxan. She finished back in April 2015.  I am thankful that  her hemoglobin is holding steady. With the cooler weather, I'm sure it will head back down.  We will see her back in 6 months. She knows that she has to dress very warmly and protect her head and hands during the winter. This is where she will lose a lot of heat and this could affect her blood count.  Volanda Napoleon, MD 10/19/201712:48 PM

## 2016-09-30 LAB — RETICULOCYTES: RETICULOCYTE COUNT: 4.7 % — AB (ref 0.6–2.6)

## 2016-09-30 LAB — IRON AND TIBC
%SAT: 65 % — AB (ref 21–57)
IRON: 160 ug/dL — AB (ref 41–142)
TIBC: 246 ug/dL (ref 236–444)
UIBC: 86 ug/dL — AB (ref 120–384)

## 2016-09-30 LAB — FERRITIN: FERRITIN: 299 ng/mL — AB (ref 9–269)

## 2016-09-30 LAB — HAPTOGLOBIN: Haptoglobin: <10 mg/dL — ABNORMAL LOW (ref 34–200)

## 2016-09-30 LAB — COLD AGGLUTININ TITER

## 2016-11-08 ENCOUNTER — Other Ambulatory Visit: Payer: Self-pay | Admitting: *Deleted

## 2016-11-08 DIAGNOSIS — D501 Sideropenic dysphagia: Secondary | ICD-10-CM

## 2016-11-08 DIAGNOSIS — G4701 Insomnia due to medical condition: Secondary | ICD-10-CM

## 2016-11-08 DIAGNOSIS — D591 Other autoimmune hemolytic anemias: Secondary | ICD-10-CM

## 2016-11-08 DIAGNOSIS — D5912 Cold autoimmune hemolytic anemia: Secondary | ICD-10-CM

## 2016-11-08 MED ORDER — ZOLPIDEM TARTRATE 10 MG PO TABS: 10.0000 mg | ORAL_TABLET | Freq: Every day | ORAL | 3 refills | Status: DC

## 2016-12-01 ENCOUNTER — Encounter: Payer: Self-pay | Admitting: Hematology & Oncology

## 2016-12-27 DIAGNOSIS — R69 Illness, unspecified: Secondary | ICD-10-CM | POA: Diagnosis not present

## 2017-01-13 DIAGNOSIS — R69 Illness, unspecified: Secondary | ICD-10-CM | POA: Diagnosis not present

## 2017-01-26 DIAGNOSIS — E038 Other specified hypothyroidism: Secondary | ICD-10-CM | POA: Diagnosis not present

## 2017-01-26 DIAGNOSIS — R42 Dizziness and giddiness: Secondary | ICD-10-CM | POA: Diagnosis not present

## 2017-01-26 DIAGNOSIS — H8110 Benign paroxysmal vertigo, unspecified ear: Secondary | ICD-10-CM | POA: Diagnosis not present

## 2017-01-26 DIAGNOSIS — E039 Hypothyroidism, unspecified: Secondary | ICD-10-CM | POA: Diagnosis not present

## 2017-01-26 DIAGNOSIS — M8580 Other specified disorders of bone density and structure, unspecified site: Secondary | ICD-10-CM | POA: Diagnosis not present

## 2017-01-26 DIAGNOSIS — E871 Hypo-osmolality and hyponatremia: Secondary | ICD-10-CM | POA: Diagnosis not present

## 2017-01-26 DIAGNOSIS — D589 Hereditary hemolytic anemia, unspecified: Secondary | ICD-10-CM | POA: Diagnosis not present

## 2017-01-26 DIAGNOSIS — I10 Essential (primary) hypertension: Secondary | ICD-10-CM | POA: Diagnosis not present

## 2017-01-30 DIAGNOSIS — R69 Illness, unspecified: Secondary | ICD-10-CM | POA: Diagnosis not present

## 2017-02-01 DIAGNOSIS — E871 Hypo-osmolality and hyponatremia: Secondary | ICD-10-CM | POA: Diagnosis not present

## 2017-02-13 ENCOUNTER — Other Ambulatory Visit: Payer: Self-pay | Admitting: *Deleted

## 2017-02-13 DIAGNOSIS — D500 Iron deficiency anemia secondary to blood loss (chronic): Secondary | ICD-10-CM

## 2017-02-13 MED ORDER — FOLIC ACID 1 MG PO TABS: 2.0000 mg | ORAL_TABLET | Freq: Every day | ORAL | 12 refills | Status: DC

## 2017-02-21 ENCOUNTER — Encounter (HOSPITAL_COMMUNITY): Payer: Self-pay

## 2017-02-21 ENCOUNTER — Ambulatory Visit (HOSPITAL_COMMUNITY)
Admission: RE | Admit: 2017-02-21 | Discharge: 2017-02-21 | Disposition: A | Discharge status: Home / Self Care | Payer: Medicare HMO | Source: Ambulatory Visit | Attending: Internal Medicine | Admitting: Internal Medicine

## 2017-02-21 DIAGNOSIS — M810 Age-related osteoporosis without current pathological fracture: Secondary | ICD-10-CM | POA: Insufficient documentation

## 2017-02-21 MED ORDER — SODIUM CHLORIDE 0.9 % IV SOLN
INTRAVENOUS | Status: DC
  Administered 2017-02-21: 15:00:00 via INTRAVENOUS

## 2017-02-21 MED ORDER — ZOLEDRONIC ACID 5 MG/100ML IV SOLN
5.0000 mg | Freq: Once | INTRAVENOUS | Status: AC
  Administered 2017-02-21: 5 mg via INTRAVENOUS
  Filled 2017-02-21: qty 100

## 2017-02-21 NOTE — Discharge Instructions (Signed)
Drink  Fluids/water as tolerated over the next 72 hours °Tylenol or ibuprofen if needed for aches and pains  °Continue Calcium and Vit D as directed by your MD ° ° ° °Reclast °Zoledronic Acid injection (Paget's Disease, Osteoporosis) °What is this medicine? °ZOLEDRONIC ACID (ZOE le dron ik AS id) lowers the amount of calcium loss from bone. It is used to treat Paget's disease and osteoporosis in women. °This medicine may be used for other purposes; ask your health care provider or pharmacist if you have questions. °COMMON BRAND NAME(S): Reclast, Zometa °What should I tell my health care provider before I take this medicine? °They need to know if you have any of these conditions: °-aspirin-sensitive asthma °-cancer, especially if you are receiving medicines used to treat cancer °-dental disease or wear dentures °-infection °-kidney disease °-low levels of calcium in the blood °-past surgery on the parathyroid gland or intestines °-receiving corticosteroids like dexamethasone or prednisone °-an unusual or allergic reaction to zoledronic acid, other medicines, foods, dyes, or preservatives °-pregnant or trying to get pregnant °-breast-feeding °How should I use this medicine? °This medicine is for infusion into a vein. It is given by a health care professional in a hospital or clinic setting. °Talk to your pediatrician regarding the use of this medicine in children. This medicine is not approved for use in children. °Overdosage: If you think you have taken too much of this medicine contact a poison control center or emergency room at once. °NOTE: This medicine is only for you. Do not share this medicine with others. °What if I miss a dose? °It is important not to miss your dose. Call your doctor or health care professional if you are unable to keep an appointment. °What may interact with this medicine? °-certain antibiotics given by injection °-NSAIDs, medicines for pain and inflammation, like ibuprofen or  naproxen °-some diuretics like bumetanide, furosemide °-teriparatide °This list may not describe all possible interactions. Give your health care provider a list of all the medicines, herbs, non-prescription drugs, or dietary supplements you use. Also tell them if you smoke, drink alcohol, or use illegal drugs. Some items may interact with your medicine. °What should I watch for while using this medicine? °Visit your doctor or health care professional for regular checkups. It may be some time before you see the benefit from this medicine. Do not stop taking your medicine unless your doctor tells you to. Your doctor may order blood tests or other tests to see how you are doing. °Women should inform their doctor if they wish to become pregnant or think they might be pregnant. There is a potential for serious side effects to an unborn child. Talk to your health care professional or pharmacist for more information. °You should make sure that you get enough calcium and vitamin D while you are taking this medicine. Discuss the foods you eat and the vitamins you take with your health care professional. °Some people who take this medicine have severe bone, joint, and/or muscle pain. This medicine may also increase your risk for jaw problems or a broken thigh bone. Tell your doctor right away if you have severe pain in your jaw, bones, joints, or muscles. Tell your doctor if you have any pain that does not go away or that gets worse. °Tell your dentist and dental surgeon that you are taking this medicine. You should not have major dental surgery while on this medicine. See your dentist to have a dental exam and fix any dental problems   before starting this medicine. Take good care of your teeth while on this medicine. Make sure you see your dentist for regular follow-up appointments. °What side effects may I notice from receiving this medicine? °Side effects that you should report to your doctor or health care professional as  soon as possible: °-allergic reactions like skin rash, itching or hives, swelling of the face, lips, or tongue °-anxiety, confusion, or depression °-breathing problems °-changes in vision °-eye pain °-feeling faint or lightheaded, falls °-jaw pain, especially after dental work °-mouth sores °-muscle cramps, stiffness, or weakness °-redness, blistering, peeling or loosening of the skin, including inside the mouth °-trouble passing urine or change in the amount of urine °Side effects that usually do not require medical attention (report to your doctor or health care professional if they continue or are bothersome): °-bone, joint, or muscle pain °-constipation °-diarrhea °-fever °-hair loss °-irritation at site where injected °-loss of appetite °-nausea, vomiting °-stomach upset °-trouble sleeping °-trouble swallowing °-weak or tired °This list may not describe all possible side effects. Call your doctor for medical advice about side effects. You may report side effects to FDA at 1-800-FDA-1088. °Where should I keep my medicine? °This drug is given in a hospital or clinic and will not be stored at home. °NOTE: This sheet is a summary. It may not cover all possible information. If you have questions about this medicine, talk to your doctor, pharmacist, or health care provider. °© 2018 [employer] (2014-04-26 14:19:57) ° °

## 2017-02-22 ENCOUNTER — Telehealth: Payer: Self-pay | Admitting: *Deleted

## 2017-02-22 ENCOUNTER — Other Ambulatory Visit: Payer: Self-pay | Admitting: *Deleted

## 2017-02-22 DIAGNOSIS — D5912 Cold autoimmune hemolytic anemia: Secondary | ICD-10-CM

## 2017-02-22 DIAGNOSIS — D591 Other autoimmune hemolytic anemias: Principal | ICD-10-CM

## 2017-02-22 DIAGNOSIS — R69 Illness, unspecified: Secondary | ICD-10-CM | POA: Diagnosis not present

## 2017-02-22 NOTE — Telephone Encounter (Signed)
Patient isn't feeling well and she would like her hemoglobin and platelets checked before her appointment in April.  Appointment made for CBC this week.

## 2017-02-23 ENCOUNTER — Other Ambulatory Visit (HOSPITAL_BASED_OUTPATIENT_CLINIC_OR_DEPARTMENT_OTHER): Payer: Medicare HMO

## 2017-02-23 ENCOUNTER — Encounter: Payer: Self-pay | Admitting: *Deleted

## 2017-02-23 DIAGNOSIS — D591 Other autoimmune hemolytic anemias: Secondary | ICD-10-CM

## 2017-02-23 DIAGNOSIS — D5912 Cold autoimmune hemolytic anemia: Secondary | ICD-10-CM

## 2017-02-23 LAB — CBC WITH DIFFERENTIAL (CANCER CENTER ONLY)
BASO#: 0 10*3/uL (ref 0.0–0.2)
BASO%: 0.3 % (ref 0.0–2.0)
EOS ABS: 0.1 10*3/uL (ref 0.0–0.5)
EOS%: 1.1 % (ref 0.0–7.0)
HCT: 31.9 % — ABNORMAL LOW (ref 34.8–46.6)
HGB: 11.1 g/dL — ABNORMAL LOW (ref 11.6–15.9)
LYMPH#: 0.8 10*3/uL — ABNORMAL LOW (ref 0.9–3.3)
LYMPH%: 10.6 % — AB (ref 14.0–48.0)
MCH: 33.5 pg (ref 26.0–34.0)
MCHC: 34.8 g/dL (ref 32.0–36.0)
MCV: 96 fL (ref 81–101)
MONO#: 0.4 10*3/uL (ref 0.1–0.9)
MONO%: 6.2 % (ref 0.0–13.0)
NEUT#: 5.8 10*3/uL (ref 1.5–6.5)
NEUT%: 81.8 % — AB (ref 39.6–80.0)
PLATELETS: 263 10*3/uL (ref 145–400)
RBC: 3.31 10*6/uL — ABNORMAL LOW (ref 3.70–5.32)
RDW: 13.4 % (ref 11.1–15.7)
WBC: 7.1 10*3/uL (ref 3.9–10.0)

## 2017-03-08 DIAGNOSIS — R69 Illness, unspecified: Secondary | ICD-10-CM | POA: Diagnosis not present

## 2017-03-30 ENCOUNTER — Ambulatory Visit: Payer: Medicare Other | Admitting: Hematology & Oncology

## 2017-03-30 ENCOUNTER — Other Ambulatory Visit: Payer: Medicare Other

## 2017-04-11 ENCOUNTER — Other Ambulatory Visit: Payer: Self-pay | Admitting: *Deleted

## 2017-04-11 DIAGNOSIS — D591 Other autoimmune hemolytic anemias: Secondary | ICD-10-CM

## 2017-04-11 DIAGNOSIS — D5912 Cold autoimmune hemolytic anemia: Secondary | ICD-10-CM

## 2017-04-11 DIAGNOSIS — G4701 Insomnia due to medical condition: Secondary | ICD-10-CM

## 2017-04-11 DIAGNOSIS — D501 Sideropenic dysphagia: Secondary | ICD-10-CM

## 2017-04-11 MED ORDER — ZOLPIDEM TARTRATE 10 MG PO TABS: 10.0000 mg | ORAL_TABLET | Freq: Every day | ORAL | 3 refills | Status: DC

## 2017-04-14 DIAGNOSIS — R69 Illness, unspecified: Secondary | ICD-10-CM | POA: Diagnosis not present

## 2017-05-05 DIAGNOSIS — Z1231 Encounter for screening mammogram for malignant neoplasm of breast: Secondary | ICD-10-CM | POA: Diagnosis not present

## 2017-05-24 DIAGNOSIS — R69 Illness, unspecified: Secondary | ICD-10-CM | POA: Diagnosis not present

## 2017-07-04 DIAGNOSIS — R69 Illness, unspecified: Secondary | ICD-10-CM | POA: Diagnosis not present

## 2017-07-27 ENCOUNTER — Other Ambulatory Visit: Payer: Self-pay | Admitting: *Deleted

## 2017-07-27 DIAGNOSIS — G4701 Insomnia due to medical condition: Secondary | ICD-10-CM

## 2017-07-27 DIAGNOSIS — D591 Other autoimmune hemolytic anemias: Secondary | ICD-10-CM

## 2017-07-27 DIAGNOSIS — D5912 Cold autoimmune hemolytic anemia: Secondary | ICD-10-CM

## 2017-07-27 DIAGNOSIS — D501 Sideropenic dysphagia: Secondary | ICD-10-CM

## 2017-07-27 MED ORDER — ZOLPIDEM TARTRATE 10 MG PO TABS: 10.0000 mg | ORAL_TABLET | Freq: Every day | ORAL | 3 refills | Status: DC

## 2017-08-07 DIAGNOSIS — R69 Illness, unspecified: Secondary | ICD-10-CM | POA: Diagnosis not present

## 2017-08-10 DIAGNOSIS — D2262 Melanocytic nevi of left upper limb, including shoulder: Secondary | ICD-10-CM | POA: Diagnosis not present

## 2017-08-10 DIAGNOSIS — L812 Freckles: Secondary | ICD-10-CM | POA: Diagnosis not present

## 2017-08-10 DIAGNOSIS — D692 Other nonthrombocytopenic purpura: Secondary | ICD-10-CM | POA: Diagnosis not present

## 2017-08-10 DIAGNOSIS — L821 Other seborrheic keratosis: Secondary | ICD-10-CM | POA: Diagnosis not present

## 2017-08-10 DIAGNOSIS — D2271 Melanocytic nevi of right lower limb, including hip: Secondary | ICD-10-CM | POA: Diagnosis not present

## 2017-08-10 DIAGNOSIS — L570 Actinic keratosis: Secondary | ICD-10-CM | POA: Diagnosis not present

## 2017-08-10 DIAGNOSIS — D225 Melanocytic nevi of trunk: Secondary | ICD-10-CM | POA: Diagnosis not present

## 2017-08-10 DIAGNOSIS — D2272 Melanocytic nevi of left lower limb, including hip: Secondary | ICD-10-CM | POA: Diagnosis not present

## 2017-08-10 DIAGNOSIS — D1801 Hemangioma of skin and subcutaneous tissue: Secondary | ICD-10-CM | POA: Diagnosis not present

## 2017-08-10 DIAGNOSIS — L814 Other melanin hyperpigmentation: Secondary | ICD-10-CM | POA: Diagnosis not present

## 2017-09-26 DIAGNOSIS — R69 Illness, unspecified: Secondary | ICD-10-CM | POA: Diagnosis not present

## 2017-11-24 ENCOUNTER — Other Ambulatory Visit: Payer: Self-pay | Admitting: *Deleted

## 2017-11-24 DIAGNOSIS — D501 Sideropenic dysphagia: Secondary | ICD-10-CM

## 2017-11-24 DIAGNOSIS — D591 Other autoimmune hemolytic anemias: Secondary | ICD-10-CM

## 2017-11-24 DIAGNOSIS — G4701 Insomnia due to medical condition: Secondary | ICD-10-CM

## 2017-11-24 DIAGNOSIS — D5912 Cold autoimmune hemolytic anemia: Secondary | ICD-10-CM

## 2017-11-24 MED ORDER — ZOLPIDEM TARTRATE 10 MG PO TABS: 10.0000 mg | ORAL_TABLET | Freq: Every day | ORAL | 3 refills | Status: DC

## 2017-12-15 DIAGNOSIS — Z0000 Encounter for general adult medical examination without abnormal findings: Secondary | ICD-10-CM | POA: Diagnosis not present

## 2017-12-15 DIAGNOSIS — I10 Essential (primary) hypertension: Secondary | ICD-10-CM | POA: Diagnosis not present

## 2017-12-15 DIAGNOSIS — M859 Disorder of bone density and structure, unspecified: Secondary | ICD-10-CM | POA: Diagnosis not present

## 2017-12-15 DIAGNOSIS — R82998 Other abnormal findings in urine: Secondary | ICD-10-CM | POA: Diagnosis not present

## 2017-12-15 DIAGNOSIS — R7301 Impaired fasting glucose: Secondary | ICD-10-CM | POA: Diagnosis not present

## 2017-12-15 DIAGNOSIS — E038 Other specified hypothyroidism: Secondary | ICD-10-CM | POA: Diagnosis not present

## 2017-12-21 DIAGNOSIS — I10 Essential (primary) hypertension: Secondary | ICD-10-CM | POA: Diagnosis not present

## 2017-12-21 DIAGNOSIS — M859 Disorder of bone density and structure, unspecified: Secondary | ICD-10-CM | POA: Diagnosis not present

## 2017-12-21 DIAGNOSIS — E781 Pure hyperglyceridemia: Secondary | ICD-10-CM | POA: Diagnosis not present

## 2017-12-21 DIAGNOSIS — R42 Dizziness and giddiness: Secondary | ICD-10-CM | POA: Diagnosis not present

## 2017-12-21 DIAGNOSIS — E038 Other specified hypothyroidism: Secondary | ICD-10-CM | POA: Diagnosis not present

## 2017-12-21 DIAGNOSIS — M8580 Other specified disorders of bone density and structure, unspecified site: Secondary | ICD-10-CM | POA: Diagnosis not present

## 2017-12-21 DIAGNOSIS — D589 Hereditary hemolytic anemia, unspecified: Secondary | ICD-10-CM | POA: Diagnosis not present

## 2017-12-21 DIAGNOSIS — Z6822 Body mass index (BMI) 22.0-22.9, adult: Secondary | ICD-10-CM | POA: Diagnosis not present

## 2017-12-21 DIAGNOSIS — Z0000 Encounter for general adult medical examination without abnormal findings: Secondary | ICD-10-CM | POA: Diagnosis not present

## 2017-12-21 DIAGNOSIS — D591 Other autoimmune hemolytic anemias: Secondary | ICD-10-CM | POA: Diagnosis not present

## 2017-12-21 DIAGNOSIS — R7301 Impaired fasting glucose: Secondary | ICD-10-CM | POA: Diagnosis not present

## 2018-01-22 DIAGNOSIS — H524 Presbyopia: Secondary | ICD-10-CM | POA: Diagnosis not present

## 2018-01-22 DIAGNOSIS — I10 Essential (primary) hypertension: Secondary | ICD-10-CM | POA: Diagnosis not present

## 2018-01-22 DIAGNOSIS — H5203 Hypermetropia, bilateral: Secondary | ICD-10-CM | POA: Diagnosis not present

## 2018-01-22 DIAGNOSIS — H11153 Pinguecula, bilateral: Secondary | ICD-10-CM | POA: Diagnosis not present

## 2018-01-22 DIAGNOSIS — H52223 Regular astigmatism, bilateral: Secondary | ICD-10-CM | POA: Diagnosis not present

## 2018-01-24 DIAGNOSIS — R300 Dysuria: Secondary | ICD-10-CM | POA: Diagnosis not present

## 2018-01-24 DIAGNOSIS — R8290 Unspecified abnormal findings in urine: Secondary | ICD-10-CM | POA: Diagnosis not present

## 2018-01-24 DIAGNOSIS — I10 Essential (primary) hypertension: Secondary | ICD-10-CM | POA: Diagnosis not present

## 2018-01-24 DIAGNOSIS — N390 Urinary tract infection, site not specified: Secondary | ICD-10-CM | POA: Diagnosis not present

## 2018-01-25 ENCOUNTER — Ambulatory Visit: Payer: Medicare HMO | Admitting: Podiatry

## 2018-01-25 ENCOUNTER — Ambulatory Visit (INDEPENDENT_AMBULATORY_CARE_PROVIDER_SITE_OTHER): Payer: Medicare HMO

## 2018-01-25 ENCOUNTER — Other Ambulatory Visit: Payer: Self-pay | Admitting: Podiatry

## 2018-01-25 VITALS — HR 67 | Resp 16

## 2018-01-25 DIAGNOSIS — M2041 Other hammer toe(s) (acquired), right foot: Secondary | ICD-10-CM | POA: Diagnosis not present

## 2018-01-25 DIAGNOSIS — M898X9 Other specified disorders of bone, unspecified site: Secondary | ICD-10-CM

## 2018-01-25 DIAGNOSIS — L84 Corns and callosities: Secondary | ICD-10-CM

## 2018-01-25 NOTE — Patient Instructions (Signed)
Pre-Operative Instructions  Congratulations, you have decided to take an important step towards improving your quality of life.  You can be assured that the doctors and staff at [facility] will be with you every step of the way.  Here are some important things you should know:  1. Plan to be at the surgery center/hospital at least 1 (one) hour prior to your scheduled time, unless otherwise directed by the surgical center/hospital staff.  You must have a responsible adult accompany you, remain during the surgery and drive you home.  Make sure you have directions to the surgical center/hospital to ensure you arrive on time. 2. If you are having surgery at [facility] or [facility], you will need a copy of your medical history and physical form from your family physician within one month prior to the date of surgery. We will give you a form for your primary physician to complete.  3. We make every effort to accommodate the date you request for surgery.  However, there are times where surgery dates or times have to be moved.  We will contact you as soon as possible if a change in schedule is required.   4. No aspirin/ibuprofen for one week before surgery.  If you are on aspirin, any non-steroidal anti-inflammatory medications (Mobic, Aleve, Ibuprofen) should not be taken seven (7) days prior to your surgery.  You make take Tylenol for pain prior to surgery.  5. Medications - If you are taking daily heart and blood pressure medications, seizure, reflux, allergy, asthma, anxiety, pain or diabetes medications, make sure you notify the surgery center/hospital before the day of surgery so they can tell you which medications you should take or avoid the day of surgery. 6. No food or drink after midnight the night before surgery unless directed otherwise by surgical center/hospital staff. 7. No alcoholic beverages 24-hours prior to surgery.  No smoking 24-hours prior or 24-hours after  surgery. 8. Wear loose pants or shorts. They should be loose enough to fit over bandages, boots, and casts. 9. Don't wear slip-on shoes. Sneakers are preferred. 10. Bring your boot with you to the surgery center/hospital.  Also bring crutches or a walker if your physician has prescribed it for you.  If you do not have this equipment, it will be provided for you after surgery. 11. If you have not been contacted by the surgery center/hospital by the day before your surgery, call to confirm the date and time of your surgery. 12. Leave-time from work may vary depending on the type of surgery you have.  Appropriate arrangements should be made prior to surgery with your employer. 13. Prescriptions will be provided immediately following surgery by your doctor.  Fill these as soon as possible after surgery and take the medication as directed. Pain medications will not be refilled on weekends and must be approved by the doctor. 14. Remove nail polish on the operative foot and avoid getting pedicures prior to surgery. 15. Wash the night before surgery.  The night before surgery wash the foot and leg well with water and the antibacterial soap provided. Be sure to pay special attention to beneath the toenails and in between the toes.  Wash for at least three (3) minutes. Rinse thoroughly with water and dry well with a towel.  Perform this wash unless told not to do so by your physician.  Enclosed: 1 Ice pack (please put in freezer the night before surgery)   1 Hibiclens skin cleaner     Pre-op instructions  If you have any questions regarding the instructions, please do not hesitate to call our office.  [place]: [address], [place], [place] [address] -- [phone]  [place]: [address] [place], [place] [address] -- [phone]  [place]: [address]  [place], [place] [address] -- [phone]  [place]: [address], [address], [place], [place] [address] -- [phone]  Website:  [email or web address] 

## 2018-01-26 DIAGNOSIS — I10 Essential (primary) hypertension: Secondary | ICD-10-CM | POA: Diagnosis not present

## 2018-01-27 NOTE — Progress Notes (Signed)
She presents today as a new patient with pain between the fourth and fifth toes of her right foot.  She states that he should have his callused area for several years between the fourth and fifth digits of the right foot that is exquisitely painful.  She states she is tried trimming it and padding it with some help but she is tired of having to do so and would like to have this taken care of if at all possible.  Objective: Vital signs are stable alert and oriented x3.  Pulses are palpable.  Neurologic sensorium is intact.  Deep tendon reflexes are intact.  Muscle strength +5/5 dorsiflexors plantar flexors inverters everters all intrinsic musculature is intact.  Orthopedic evaluation demonstrates all joints distal to the ankle have a full range of motion without crepitation.  Cutaneous evaluation demonstrates supple well-hydrated cutis with exception of reactive hyperkeratosis between the fourth and fifth digits of the right foot that does not hurt currently appear to be infected.  Was debrided does not demonstrate any bleeding and there were no holes noted.  She has an adductovarus rotated hammertoe deformity fifth right and as well as a mild tailor's bunion deformity.  Radiographs taken today 3 views of the right foot do indicate similar findings with tailors bunion deformity adductovarus rotation of the fifth toe and abduction of the lesser toes toward the hallux.  Cutaneous evaluation demonstrates only the reactive hyperkeratosis again no signs of infection even in the soft tissues on radiograph.  Assessment: Tailor's bunion deformity adductovarus rotated fifth toe deformity and chronic wound.  Plan: Discussed etiology pathology conservative or surgical therapies.  I discussed with her in great detail today surgical consideration consisting of a arthroplasty of the fifth toe and a syndactylization of the painful area.  She understands this and is amenable to it.  We did discuss in great detail today the  fact that we need to also perform fifth met metatarsal osteotomy the patient declines this treatment.  Understands that this may result in less than desirable outcome.  We discussed the pros and cons of the surgery and the length of time that I expect for this to take to heal.  She understands this is amenable to it signed all 3 pages of the consent form.  We also dispensed a preop surgical kit with both oral and written home-going instructions as well as a Darco shoe.  The information includes information about the surgery center as well as anesthesia group.  Follow-up with her in the near future for surgical intervention.

## 2018-01-27 NOTE — Progress Notes (Signed)
Subjective:   Patient ID: Christine [name], female   DOB: 72 y.o.   MRN: [id number]   HPI    ROS      Objective:  Physical Exam       Assessment:       Plan:

## 2018-02-01 ENCOUNTER — Telehealth: Payer: Self-pay | Admitting: *Deleted

## 2018-02-01 NOTE — Telephone Encounter (Signed)
I am calling to reschedule your surgery.  We had an error in our scheduling.  Dr. Milinda Pointer will not be able to do your surgery until April 5.  Is that date okay?  "It's a month away but okay.  That shouldn't be a problem.  I will check my schedules.  If there's a problem I will let you know.  I forgot what he said my surgery would entail.  Will I be able to walk?"  Yes, you will be able to walk.  He's only cutting out a little piece of bone.  You will be wearing a surgical shoe.

## 2018-02-02 ENCOUNTER — Telehealth: Payer: Self-pay | Admitting: *Deleted

## 2018-02-02 NOTE — Telephone Encounter (Signed)
I'm returning your call.  You had questions regarding the date of your surgery.  "Yes, my surgery was rescheduled.  I don't know why."  We had to move your surgery because Dr. Milinda Pointer was over booked for March 15.  "I hope this doesn't happen often."  No, it doesn't.  "My concern is my husband has a golf tournament that weekend.  I know he will not go if I have this surgery on that day.  What are my other options?"  Dr. Milinda Pointer can do it on April 12.  "It might be best for me to do it then.  Put me down for that date.  If I change my mind I will call you back."  Okay, I'll get it rescheduled to April 12.

## 2018-02-02 NOTE — Telephone Encounter (Signed)
"  I'm scheduled to have surgery with Dr. Milinda Pointer on March 15.  I need somebody to call me back and let me know what reschedule they did.  They called me and said they were going to have to reschedule it.  I was not really available at the time to discuss it.  Please call me back."

## 2018-02-16 ENCOUNTER — Other Ambulatory Visit: Payer: Self-pay | Admitting: *Deleted

## 2018-02-16 DIAGNOSIS — D500 Iron deficiency anemia secondary to blood loss (chronic): Secondary | ICD-10-CM

## 2018-02-16 MED ORDER — FOLIC ACID 1 MG PO TABS
2.0000 mg | ORAL_TABLET | Freq: Every day | ORAL | 3 refills | Status: DC
Start: 2018-02-16 — End: 2019-10-14

## 2018-02-22 ENCOUNTER — Encounter: Payer: Self-pay | Admitting: Podiatry

## 2018-02-28 ENCOUNTER — Encounter: Payer: Self-pay | Admitting: Podiatry

## 2018-03-12 DIAGNOSIS — M79642 Pain in left hand: Secondary | ICD-10-CM | POA: Diagnosis not present

## 2018-03-12 DIAGNOSIS — M13842 Other specified arthritis, left hand: Secondary | ICD-10-CM | POA: Diagnosis not present

## 2018-03-12 DIAGNOSIS — M79641 Pain in right hand: Secondary | ICD-10-CM | POA: Diagnosis not present

## 2018-03-12 DIAGNOSIS — M189 Osteoarthritis of first carpometacarpal joint, unspecified: Secondary | ICD-10-CM | POA: Diagnosis not present

## 2018-03-12 DIAGNOSIS — M25531 Pain in right wrist: Secondary | ICD-10-CM | POA: Diagnosis not present

## 2018-03-12 DIAGNOSIS — M1812 Unilateral primary osteoarthritis of first carpometacarpal joint, left hand: Secondary | ICD-10-CM | POA: Diagnosis not present

## 2018-03-21 ENCOUNTER — Other Ambulatory Visit: Payer: Self-pay | Admitting: Podiatry

## 2018-03-21 MED ORDER — ONDANSETRON HCL 4 MG PO TABS: 4.0000 mg | ORAL_TABLET | Freq: Three times a day (TID) | ORAL | 0 refills | Status: DC | PRN

## 2018-03-21 MED ORDER — OXYCODONE-ACETAMINOPHEN 10-325 MG PO TABS: 1.0000 | ORAL_TABLET | ORAL | 0 refills | Status: AC | PRN

## 2018-03-21 MED ORDER — CEPHALEXIN 500 MG PO CAPS: 500.0000 mg | ORAL_CAPSULE | Freq: Three times a day (TID) | ORAL | 0 refills | Status: DC

## 2018-03-22 ENCOUNTER — Other Ambulatory Visit: Payer: Medicare HMO

## 2018-03-23 ENCOUNTER — Encounter: Payer: Self-pay | Admitting: Podiatry

## 2018-03-23 DIAGNOSIS — M7989 Other specified soft tissue disorders: Secondary | ICD-10-CM | POA: Diagnosis not present

## 2018-03-23 DIAGNOSIS — L852 Keratosis punctata (palmaris et plantaris): Secondary | ICD-10-CM | POA: Diagnosis not present

## 2018-03-23 DIAGNOSIS — M2041 Other hammer toe(s) (acquired), right foot: Secondary | ICD-10-CM | POA: Diagnosis not present

## 2018-03-23 DIAGNOSIS — I10 Essential (primary) hypertension: Secondary | ICD-10-CM | POA: Diagnosis not present

## 2018-03-26 ENCOUNTER — Telehealth: Payer: Self-pay | Admitting: *Deleted

## 2018-03-26 NOTE — Telephone Encounter (Signed)
POST OP CALL-    1) General condition stated by the patient: Great!!  2) Is the pt having pain? "The first day but I'm fine now"  3) Pain score: 0  4) Has the pt taken Rx'd medication? Not anymore  5) Is the pain medication giving relief? N/A  6) Any fever, chills, nausea, or vomiting? No  7) Any shortness of breath or tightness in the calf? No  8) Is the bandages clean, dry and intact? Yes  9) Is the bandage excessively tight? No  10) Is there excessive bleeding or drainage coming through the bandage? No  11) Did you understand all of the post op instruction sheet given? Yes  12) Any questions or concerns regarding post op care/recovery? No    Confirmed POV appointment with patient

## 2018-03-29 ENCOUNTER — Ambulatory Visit (INDEPENDENT_AMBULATORY_CARE_PROVIDER_SITE_OTHER): Payer: Self-pay | Admitting: Podiatry

## 2018-03-29 ENCOUNTER — Ambulatory Visit (INDEPENDENT_AMBULATORY_CARE_PROVIDER_SITE_OTHER): Payer: Medicare HMO

## 2018-03-29 VITALS — BP 121/81 | HR 70 | Temp 97.3°F

## 2018-03-29 DIAGNOSIS — M2041 Other hammer toe(s) (acquired), right foot: Secondary | ICD-10-CM | POA: Diagnosis not present

## 2018-03-29 NOTE — Progress Notes (Signed)
She presents today for her first postop visit date of surgery March 23, 2018.  She states that I had no pain whatsoever as she refers to arthroplasty fifth digit was syndactylization.  She denies fever chills nausea vomiting muscle aches pains she denies calf pain chest pain shortness of breath.  Objective: Vital signs are stable alert and oriented x3 dry sterile dressing intact was removed demonstrates no erythema mild ecchymosis no edema no cellulitis drainage or odor.  Sutures are intact margins appear to be coapting well.  The graft demonstrate complete arthroplasty at the level of the PIPJ and a syndactylization of the toes.  Assessment: Well-healing syndactylization and arthroplasty fifth digit right.  Plan: I redressed the foot today dresser compressive dressing follow-up with her in 1 week I doubt sutures will be removed at that point because of the syndactylization but it may take another 2 weeks.

## 2018-04-12 ENCOUNTER — Encounter: Payer: Self-pay | Admitting: Podiatry

## 2018-04-12 ENCOUNTER — Ambulatory Visit (INDEPENDENT_AMBULATORY_CARE_PROVIDER_SITE_OTHER): Payer: Medicare HMO

## 2018-04-12 ENCOUNTER — Ambulatory Visit (INDEPENDENT_AMBULATORY_CARE_PROVIDER_SITE_OTHER): Payer: Self-pay | Admitting: Podiatry

## 2018-04-12 DIAGNOSIS — M2041 Other hammer toe(s) (acquired), right foot: Secondary | ICD-10-CM

## 2018-04-12 NOTE — Progress Notes (Signed)
She presents today for follow-up of her arthroplasty fifth toe right and syndactylization fourth and fifth toes.  She states that is doing fine ever to get the stitches out that washes foot.  Objective: Vital signs are stable she is alert and oriented x3.  Dry sterile dressing intact was removed demonstrates sutures are intact margins appear to be well coapted sutures were removed today after topical anesthesia was administered.  There was no bleeding there is no signs of infection.  Assessment: Well-healing surgical foot right.  Plan: I encouraged her to dress the toes together #4 #5 with Covan which I demonstrated to her and provided her with and I will allow her to take a shower to wash his foot but she is not to spread the toes or drive between them other than using a hair dryer.  I will follow-up with her in 1 to 2 weeks to make sure this is healing well.

## 2018-05-01 ENCOUNTER — Ambulatory Visit (INDEPENDENT_AMBULATORY_CARE_PROVIDER_SITE_OTHER): Payer: Medicare HMO | Admitting: Podiatry

## 2018-05-01 ENCOUNTER — Encounter: Payer: Self-pay | Admitting: Podiatry

## 2018-05-01 ENCOUNTER — Other Ambulatory Visit: Payer: Self-pay

## 2018-05-01 DIAGNOSIS — M2041 Other hammer toe(s) (acquired), right foot: Secondary | ICD-10-CM

## 2018-05-01 DIAGNOSIS — L84 Corns and callosities: Secondary | ICD-10-CM

## 2018-05-02 NOTE — Progress Notes (Signed)
She presents today for her final follow-up visit date of surgery 03/23/2018 status post hammertoe repair fifth right with syndactylization fourth and fifth she states that is just a little swelling but overall is doing much better she is very happy with the outcome.  Objective: Vital signs are stable she is alert and oriented x3 denies fever chills nausea vomiting muscle aches and pains.  Pulses are strongly palpable the incision site is gone on to heal 100% there is no erythema cellulitis drainage or odor to some mild edema about the fifth toe right foot.  Assessment: Well-healing surgical toe right.  Plan: Follow-up with [place] on an as-needed basis.

## 2018-05-09 DIAGNOSIS — Z1231 Encounter for screening mammogram for malignant neoplasm of breast: Secondary | ICD-10-CM | POA: Diagnosis not present

## 2018-09-19 DIAGNOSIS — D588 Other specified hereditary hemolytic anemias: Secondary | ICD-10-CM | POA: Diagnosis not present

## 2018-09-19 DIAGNOSIS — D1801 Hemangioma of skin and subcutaneous tissue: Secondary | ICD-10-CM | POA: Diagnosis not present

## 2018-09-19 DIAGNOSIS — L821 Other seborrheic keratosis: Secondary | ICD-10-CM | POA: Diagnosis not present

## 2018-09-19 DIAGNOSIS — D692 Other nonthrombocytopenic purpura: Secondary | ICD-10-CM | POA: Diagnosis not present

## 2018-09-19 DIAGNOSIS — L570 Actinic keratosis: Secondary | ICD-10-CM | POA: Diagnosis not present

## 2018-09-19 DIAGNOSIS — L814 Other melanin hyperpigmentation: Secondary | ICD-10-CM | POA: Diagnosis not present

## 2018-09-19 DIAGNOSIS — Z23 Encounter for immunization: Secondary | ICD-10-CM | POA: Diagnosis not present

## 2018-09-24 DIAGNOSIS — N390 Urinary tract infection, site not specified: Secondary | ICD-10-CM | POA: Diagnosis not present

## 2018-09-24 DIAGNOSIS — R3129 Other microscopic hematuria: Secondary | ICD-10-CM | POA: Diagnosis not present

## 2018-09-25 DIAGNOSIS — Z23 Encounter for immunization: Secondary | ICD-10-CM | POA: Diagnosis not present

## 2019-01-17 ENCOUNTER — Ambulatory Visit: Payer: Medicare Other | Attending: Internal Medicine | Admitting: Physical Therapy

## 2019-01-17 ENCOUNTER — Encounter: Payer: Self-pay | Admitting: Physical Therapy

## 2019-01-17 ENCOUNTER — Other Ambulatory Visit: Payer: Self-pay

## 2019-01-17 DIAGNOSIS — H8113 Benign paroxysmal vertigo, bilateral: Secondary | ICD-10-CM | POA: Diagnosis present

## 2019-01-17 DIAGNOSIS — R42 Dizziness and giddiness: Secondary | ICD-10-CM

## 2019-01-17 DIAGNOSIS — R2681 Unsteadiness on feet: Secondary | ICD-10-CM

## 2019-01-17 DIAGNOSIS — R29818 Other symptoms and signs involving the nervous system: Secondary | ICD-10-CM

## 2019-01-17 NOTE — Therapy (Signed)
[facility] [address] [place], [place], [address] Phone: [phone]   Fax:  [phone]  Physical Therapy Evaluation  Patient Details  Name: Christine [name] MRN: [id number] Date of Birth: 12/01/1946 Referring Provider (PT): Link Snuffer  MD   Encounter Date: 01/17/2019  PT End of Session - 01/17/19 1911    Visit Number  1    Number of Visits  5    Date for PT Re-Evaluation  02/16/19    Authorization Type  [employer] HMO; $35 copay; VL:MN    PT Start Time  1105    PT Stop Time  1155    PT Time Calculation (min)  50 min    Activity Tolerance  Patient tolerated treatment well    Behavior During Therapy  [facility] for tasks assessed/performed       Past Medical History:  Diagnosis Date  . Anxiety   . Arthritis    hands  . 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 Deep venous thrombosis of calf ([place]) 2002  . Depression   . Diverticulosis   . History of blood transfusion 2009  . Hypertension   . Osteopenia   . PONV (postoperative nausea and vomiting)    nausea only yrs ago  . Tubular adenoma of colon 2011    Past Surgical History:  Procedure Laterality Date  . ABDOMINAL HYSTERECTOMY  age 73  . cataract surgery Bilateral 2011  . CHOLECYSTECTOMY N/A 09/20/2013   Procedure: LAPAROSCOPIC CHOLECYSTECTOMY WITH INTRAOPERATIVE CHOLANGIOGRAM;  Surgeon: Odis Hollingshead, MD;  Location: [facility];  Service: General;  Laterality: N/A;  . KNEE ARTHROSCOPY     left knee  . REPLACEMENT TOTAL KNEE Left 2006    There were no vitals filed for this visit.   Subjective Assessment - 01/17/19 1856    Subjective  Patient reports symptoms for several months "I should have come in sooner." Reports taking meclizine for dizzines ~every 3 days--doesn't like to take due to causes drowsiness and not really helping dizziness.     Pertinent History  anxiety, arthritis, DVT '02, HTN, osteopenia, colon Ca; BPPV and underwent PT    Diagnostic  tests  saw ENT--BPPV like but Dix-Hallpike showed horizontal nystagmus    Patient Stated Goals  be done with this dizziness and imbalance    Currently in Pain?  No/denies         [facility] PT Assessment - 01/17/19 0001      Assessment   Medical Diagnosis  dizziness, vertigo, BPPV    Referring Provider (PT)  Link Snuffer  MD    Onset Date/Surgical Date  --   ~ 6 months ago; Md referral 01/15/19   Prior Therapy  OPPT here 2015, 2016 for BPPV      Precautions   Precautions  Fall    Precaution Comments  has been unsteady with her  dizziness      Balance Screen   Has the patient fallen in the past 6 months  No    Has the patient had a decrease in activity level because of a fear of falling?   No    Is the patient reluctant to leave their home because of a fear of falling?   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      Observation/Other Assessments  Focus on Therapeutic Outcomes (FOTO)   not done           Vestibular Assessment - 01/17/19 0001      Vestibular Assessment   General Observation  feels imbalance;did Epley and felt vertigo sometimes to the right and sometimes to the left; neck hurts at times and wonder if part of the problem is her neck      Symptom Behavior   Type of Dizziness  Imbalance    Frequency of Dizziness  daily    Duration of Dizziness  seconds    Aggravating Factors  Looking up to the ceiling;Forward bending;Supine to sit    Relieving Factors  Head stationary   holding on to something     Occulomotor Exam   Occulomotor Alignment  Abnormal    Spontaneous  Absent    Gaze-induced  Absent    Smooth Pursuits  Saccades    Saccades  Comment   poor trajectory to pt's rt and back to center     Vestibulo-Occular Reflex   VOR Cancellation  Normal      Auditory   Comments  no hearing changes      Positional Testing   Dix-Hallpike   Dix-Hallpike Right;Dix-Hallpike Left    Horizontal Canal Testing  Horizontal Canal Right;Horizontal Canal Left;Horizontal Canal Right Intensity;Horizontal Canal Left Intensity      Dix-Hallpike Right   Dix-Hallpike Right Duration  3    Dix-Hallpike Right Symptoms  Upbeat, right rotatory nystagmus      Dix-Hallpike Left   Dix-Hallpike Left Duration  15    Dix-Hallpike Left Symptoms  Upbeat, left rotatory nystagmus      Horizontal Canal Right   Horizontal Canal Right Duration  3    Horizontal Canal Right Symptoms  Ageotrophic      Horizontal Canal Left   Horizontal Canal Left Duration  0    Horizontal Canal Left Symptoms  Normal          Objective measurements completed on examination: See above findings.       Vestibular Treatment/Exercise - 01/17/19 0001      Vestibular Treatment/Exercise   Vestibular Treatment Provided  Canalith Repositioning;Habituation    Canalith Repositioning  Epley Manuever Right;Epley Manuever Left    Habituation Exercises  Brandt Daroff       EPLEY MANUEVER RIGHT   Number of Reps   2    Overall Response  Improved Symptoms    Response Details   2nd rep, brief upbeating right rotary, when head rotated to left noted ageotropic horizontal nystagmus x 3 sec;        EPLEY MANUEVER LEFT   Number of Reps   2    Overall Response   Improved Symptoms     RESPONSE DETAILS LEFT  2nd rep, no symptoms 1st step, 2nd step (rotate to rt) with rt rotary upbeating nystagmus      Nestor Lewandowsky   Symptom Description   demonstrated to pt and handout; start 01/18/19            PT Education - 01/17/19 1909    Education Details  what is BPPV and how to treat with PT; work on hydration; Brandt-Daroff exercises; possible/likely also peripheral hypofunction due to duration of symptoms and possibly due to use of meclizine; will further assess and treat once BPPV cleared    Person(s) Educated  Patient    Methods  Explanation;Demonstration;Handout    Comprehension   Verbalized understanding  PT Long Term Goals - 01/17/19 2155      PT LONG TERM GOAL #1   Title  Pt. will have  (-) positional testing for BPPV, indicating resolved (Target all LTGs 02/16/2019)    Time  4    Period  Weeks    Status  New    Target Date  02/16/19      PT LONG TERM GOAL #2   Title  Patient will complete further vestibular assessments once vertigo cleared (if continues to have dizziness)--Head impulse test, DVA, DGI. Goals to be set as appropriate    Time  4    Period  Weeks    Status  New      PT LONG TERM GOAL #3   Title  Independent in HEP    Time  4    Period  Weeks    Status  New             Plan - 01/17/19 2144    Clinical Impression Statement  Patient referred to OPPT for vertigo, dizziness, and BPPV. Patient has a history of BPPV and has had successful vestibular rehab previously. Today she tested + for bil posterior canalithiasis, however she also has symptoms that suggest there may also be a peripheral vestibular hypofunction. Canalith repositiong maneuvers completed in conjunction with evaluation with improved symptoms, however was not yet nystagmus free with positional testing. Patient can benefit from further PT assessment and treatment (including the interventions listed below) to reduce her dizziness, imbalance and fall risk.     History and Personal Factors relevant to plan of care:  PMH-anxiety, arthritis, DVT '02, HTN, osteopenia, colon Ca; Personal factors-duration of symptoms, age    Clinical Presentation  Evolving    Clinical Presentation due to:  positional dizziness with multiple different movements; bil BPPV and further testing to be completed as BPPV cleared    Clinical Decision Making  Moderate    Rehab Potential  Good    Clinical Impairments Affecting Rehab Potential  duration of time since onset makes hypofunction complication more likely    PT Frequency  1x / week    PT Duration  4 weeks    PT Treatment/Interventions   ADLs/Self Care Home Management;Canalith Repostioning;DME Instruction;Balance training;Therapeutic exercise;Therapeutic activities;Functional mobility training;Gait training;Neuromuscular re-education;Patient/family education;Passive range of motion;Vestibular;Visual/perceptual remediation/compensation    PT Next Visit Plan  reassess for lt post BPPV (may also need to recheck other canals); review how she's doing w/Brandt-Daroff and if needs to continue; check for hypofunction and do FGA    PT Home Exercise Plan  Brandt-Daroff    Consulted and Agree with Plan of Care  Patient       Patient will benefit from skilled therapeutic intervention in order to improve the following deficits and impairments:  Decreased activity tolerance, Decreased balance, Decreased knowledge of use of DME, Decreased mobility, Decreased safety awareness, Difficulty walking, Dizziness, Impaired vision/preception  Visit Diagnosis: BPPV (benign paroxysmal positional vertigo), bilateral - Plan: PT plan of care cert/re-cert  Other symptoms and signs involving the nervous system - Plan: PT plan of care cert/re-cert  Dizziness and giddiness - Plan: PT plan of care cert/re-cert  Unsteadiness on feet - Plan: PT plan of care cert/re-cert     Problem List Patient Active Problem List   Diagnosis Date Noted  . [place] 02/10/2014  . 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 Other reasons for seeking consultation 09/09/2013  . Symptomatic cholelithiasis 09/02/2013  . Anemia 12/23/2008  . ANXIETY DEPRESSION 12/23/2008  .  Unspecified essential hypertension 11/22/2007  . PAIN IN JOINT, SITE UNSPECIFIED 11/22/2007    Rexanne Mano, PT 01/17/2019, 10:01 PM  [facility] [address] [place], [place], [address] Phone: [phone]   Fax:  [phone]  Name: TIEA MANNINEN MRN: [id number] Date of Birth: 03-20-1946

## 2019-01-17 NOTE — Patient Instructions (Signed)
  Given Brandt-Daroff for HEP

## 2019-01-23 ENCOUNTER — Encounter: Payer: Self-pay | Admitting: Physical Therapy

## 2019-01-23 ENCOUNTER — Ambulatory Visit: Payer: Medicare Other | Admitting: Physical Therapy

## 2019-01-23 DIAGNOSIS — H8113 Benign paroxysmal vertigo, bilateral: Secondary | ICD-10-CM | POA: Diagnosis not present

## 2019-01-23 DIAGNOSIS — R42 Dizziness and giddiness: Secondary | ICD-10-CM

## 2019-01-23 NOTE — Patient Instructions (Addendum)
  Modified Brandt-Daroff:    Sitting on the edge of the bed. Turn your head to the right 45 degrees.  KEEP head turned right as you lie down on your LEFT side. Wait for dizziness to stop plus 30 seconds.  Then come up with head still turned to RIGHT.  Then go down to right side with head turned to RIGHT still (will be looking towards the floor) Wait for dizziness to stop plus 30 seconds.  The come up with head still turned to the RIGHT.

## 2019-01-23 NOTE — Therapy (Signed)
[facility] [address] [place], [place], [address] Phone: [phone]   Fax:  [phone]  Physical Therapy Treatment  Patient Details  Name: Christine [name] MRN: [id number] Date of Birth: May 03, 1946 Referring Provider (PT): Link Snuffer  MD   Encounter Date: 01/23/2019  PT End of Session - 01/23/19 1236    Visit Number  2    Number of Visits  5    Date for PT Re-Evaluation  02/16/19    Authorization Type  [employer] HMO; $35 copay; VL:MN    PT Start Time  0939    PT Stop Time  1023    PT Time Calculation (min)  44 min    Activity Tolerance  Patient tolerated treatment well   mild nausea, not as bad as first appt   Behavior During Therapy  [facility] for tasks assessed/performed       Past Medical History:  Diagnosis Date  . Anxiety   . Arthritis    hands  . 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 Deep venous thrombosis of calf ([place]) 2002  . Depression   . Diverticulosis   . History of blood transfusion 2009  . Hypertension   . Osteopenia   . PONV (postoperative nausea and vomiting)    nausea only yrs ago  . Tubular adenoma of colon 2011    Past Surgical History:  Procedure Laterality Date  . ABDOMINAL HYSTERECTOMY  age 43  . cataract surgery Bilateral 2011  . CHOLECYSTECTOMY N/A 09/20/2013   Procedure: LAPAROSCOPIC CHOLECYSTECTOMY WITH INTRAOPERATIVE CHOLANGIOGRAM;  Surgeon: Odis Hollingshead, MD;  Location: [facility];  Service: General;  Laterality: N/A;  . KNEE ARTHROSCOPY     left knee  . REPLACEMENT TOTAL KNEE Left 2006    There were no vitals filed for this visit.  Subjective Assessment - 01/23/19 1808    Subjective  Patient reports symptoms have lessened--but then has a hard time describing in what way (intensity, frequency, duration?). Reports has been doing the Brandt-Daroff exercises and rarely has symtoms when she lies on her right side, always has symptoms when she lies on her left.     Pertinent History  anxiety, arthritis, DVT '02, HTN, osteopenia, colon Ca; BPPV and underwent PT    Diagnostic tests  saw ENT--BPPV like but Dix-Hallpike showed horizontal nystagmus    Patient Stated Goals  be done with this dizziness and imbalance    Currently in Pain?  No/denies             Vestibular Assessment - 01/23/19 0945      Dix-Hallpike Right   Dix-Hallpike Right Duration  3   2nd rep upbeating LEFT rotary   Dix-Hallpike Right Symptoms  Upbeat, right rotatory nystagmus      Dix-Hallpike Left   Dix-Hallpike Left Duration  10   2nd rep none   Dix-Hallpike Left Symptoms  Upbeat, left rotatory nystagmus                Vestibular Treatment/Exercise - 01/23/19 0001      Vestibular Treatment/Exercise   Canalith Repositioning  Epley Manuever Right;Epley Manuever Left;Semont Procedure Left Posterior    Habituation Exercises  Nestor Lewandowsky   with head turned to right at all times      EPLEY MANUEVER RIGHT   Number of Reps   1    Overall Response  No change       EPLEY MANUEVER LEFT   Number of Reps   1  Overall Response   Improved Symptoms     RESPONSE DETAILS LEFT   2nd step converted to rt upbeating      Semont Procedure Left Posterior   Number of Reps   1    Overall Response  No change    Response Details   upbeating left rotary in left sidelying; +strong nystagmus x 5 seconds when rt sidelying head turned right (looking toward floor and could not accurately asess direction)      Nestor Lewandowsky   Number of Reps   1    Symptom Description   altered to have pt keep head rotated to right as she lies on each side (similiar to Semont)       Self-care-Education re: what BPPV and unilateral vestibular hypofunction are; how they effect vertigo and imbalance; treatment techniques.      PT Education - 01/23/19 1235    Education Details  change to Brandt-Daroff (head rotated to right for both sides); begin Brandt-Daroff again tomorrow    Person(s)  Educated  Patient    Methods  Explanation;Demonstration;Handout;Tactile cues;Verbal cues    Comprehension  Verbalized understanding;Returned demonstration;Verbal cues required;Tactile cues required          PT Long Term Goals - 01/17/19 2155      PT LONG TERM GOAL #1   Title  Pt. will have  (-) positional testing for BPPV, indicating resolved (Target all LTGs 02/16/2019)    Time  4    Period  Weeks    Status  New    Target Date  02/16/19      PT LONG TERM GOAL #2   Title  Patient will complete further vestibular assessments once vertigo cleared (if continues to have dizziness)--Head impulse test, DVA, DGI. Goals to be set as appropriate    Time  4    Period  Weeks    Status  New      PT LONG TERM GOAL #3   Title  Independent in HEP    Time  4    Period  Weeks    Status  New            Plan - 01/23/19 1239    Clinical Impression Statement  Patient reported symptoms have lessened since initial treatment. Began with left Dix-Hallpike +for posterior canalithiasis. Treated with left Epley and on repeat Dix-Hallpike was negative for symptoms/nystagmus. Check right Dix-Hallpike with +rt posterior canalithiasis. Treated with rt Epley and on repeat rt Dix-hallpike pt had LEFT rotary upbeating nystagmus. Moved to straight head hanging with no nystagmus--returned to upright long-sitting with head neutral quickly to possibly convert posterior apogeotropic canalithiasis to non-ampullary segment posterior BPPV. (In hindsight, should have seen downbeating nystagmus in rt [facility], and it had remained upbeating). Assessed left sidelying test (due to left rotary component in right Dix-Hallpike) and again with left rotary upbeating nystagmus (very short duration). From left sidelying, completed left Semont. With no further treatment time remaining (pt required several rest periods due to nausea), educated pt that left posterior canal may still be involved and updated her Brandt-Daroff exercise  to keep her head turned to right throughout entire maneuver. Patient also mentioned during session that she has a blip of vertigo when she turns her head (seems worse turning to her right)). Discussed possible/likely hypofunction but did not want to start VOR exercises until BPPV appears cleared. Patient has 3 remaining appts and if hypofunction is present, she likely will require re-certification and should consider making additional  appointments.     Rehab Potential  Good    Clinical Impairments Affecting Rehab Potential  duration of time since onset makes hypofunction complication more likely    PT Frequency  1x / week    PT Duration  4 weeks    PT Treatment/Interventions  ADLs/Self Care Home Management;Canalith Repostioning;DME Instruction;Balance training;Therapeutic exercise;Therapeutic activities;Functional mobility training;Gait training;Neuromuscular re-education;Patient/family education;Passive range of motion;Vestibular;Visual/perceptual remediation/compensation    PT Next Visit Plan  review how she's doing w/ "modified" Brandt-Daroff (head always turned right) and if needs to continue; reassess for lt post BPPV (may also need to recheck other canals); check for hypofunction and initiate gaze stabilization exercise; ? eventually do FGA    PT Home Exercise Plan  Brandt-Daroff    Consulted and Agree with Plan of Care  Patient       Patient will benefit from skilled therapeutic intervention in order to improve the following deficits and impairments:  Decreased activity tolerance, Decreased balance, Decreased knowledge of use of DME, Decreased mobility, Decreased safety awareness, Difficulty walking, Dizziness, Impaired vision/preception  Visit Diagnosis: BPPV (benign paroxysmal positional vertigo), bilateral  Dizziness and giddiness     Problem List Patient Active Problem List   Diagnosis Date Noted  . [place] 02/10/2014  . 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 Other reasons  for seeking consultation 09/09/2013  . Symptomatic cholelithiasis 09/02/2013  . Anemia 12/23/2008  . ANXIETY DEPRESSION 12/23/2008  . Unspecified essential hypertension 11/22/2007  . PAIN IN JOINT, SITE UNSPECIFIED 11/22/2007    Rexanne Mano, PT 01/23/2019, 6:36 PM  [facility] [address] [place], [place], [address] Phone: [phone]   Fax:  [phone]  Name: Christine [name] MRN: [id number] Date of Birth: [birth year]

## 2019-01-28 ENCOUNTER — Ambulatory Visit: Payer: Medicare Other | Admitting: Rehabilitative and Restorative Service Providers"

## 2019-01-28 ENCOUNTER — Encounter: Payer: Self-pay | Admitting: Rehabilitative and Restorative Service Providers"

## 2019-01-28 DIAGNOSIS — R29818 Other symptoms and signs involving the nervous system: Secondary | ICD-10-CM

## 2019-01-28 DIAGNOSIS — R42 Dizziness and giddiness: Secondary | ICD-10-CM

## 2019-01-28 DIAGNOSIS — H8113 Benign paroxysmal vertigo, bilateral: Secondary | ICD-10-CM | POA: Diagnosis not present

## 2019-01-28 DIAGNOSIS — R2681 Unsteadiness on feet: Secondary | ICD-10-CM

## 2019-01-28 NOTE — Therapy (Signed)
[facility] [address] [place], [place], [address] Phone: [phone]   Fax:  [phone]  Physical Therapy Treatment  Patient Details  Name: MONTIE GELARDI MRN: [id number] Date of Birth: [birth year] Referring Provider (PT): Link Snuffer  MD   Encounter Date: 01/28/2019  PT End of Session - 01/28/19 0850    Visit Number  3    Number of Visits  5    Date for PT Re-Evaluation  02/16/19    Authorization Type  [employer] HMO; $35 copay; VL:MN    PT Start Time  0848    PT Stop Time  0930    PT Time Calculation (min)  42 min    Activity Tolerance  Patient tolerated treatment well   mild nausea, not as bad as first appt   Behavior During Therapy  [facility] for tasks assessed/performed       Past Medical History:  Diagnosis Date  . Anxiety   . Arthritis    hands  . 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 Deep venous thrombosis of calf ([place]) 2002  . Depression   . Diverticulosis   . History of blood transfusion 2009  . Hypertension   . Osteopenia   . PONV (postoperative nausea and vomiting)    nausea only yrs ago  . Tubular adenoma of colon 2011    Past Surgical History:  Procedure Laterality Date  . ABDOMINAL HYSTERECTOMY  age 73  . cataract surgery Bilateral 2011  . CHOLECYSTECTOMY N/A 09/20/2013   Procedure: LAPAROSCOPIC CHOLECYSTECTOMY WITH INTRAOPERATIVE CHOLANGIOGRAM;  Surgeon: Odis Hollingshead, MD;  Location: [facility];  Service: General;  Laterality: N/A;  . KNEE ARTHROSCOPY     left knee  . REPLACEMENT TOTAL KNEE Left 2006    There were no vitals filed for this visit.  Subjective Assessment - 01/28/19 0849    Subjective  The patient feels that symptoms are improving.  She is doing HEP regularly.    Pertinent History  anxiety, arthritis, DVT '02, HTN, osteopenia, colon Ca; BPPV and underwent PT    Patient Stated Goals  be done with this dizziness and imbalance    Currently in Pain?  No/denies              Vestibular Assessment - 01/28/19 0900      Vestibulo-Occular Reflex   Comment  Head impulse test + bilaterally for refixation saccade.      Visual Acuity   Static  line 7    Dynamic  line 2   2 line difference is WNLs/ patient has 5 line difference     Positional Testing   Dix-Hallpike  Dix-Hallpike Right;Dix-Hallpike Left    Horizontal Canal Testing  Horizontal Canal Right;Horizontal Canal Left      Dix-Hallpike Right   Dix-Hallpike Right Duration  20    Dix-Hallpike Right Symptoms  Downbeat, right rotatory nystagmus   low amplitude nystagmus     Dix-Hallpike Left   Dix-Hallpike Left Duration  8    Dix-Hallpike Left Symptoms  Upbeat, left rotatory nystagmus      Horizontal Canal Right   Horizontal Canal Right Duration  can view with gaze to the left (not end range)    Horizontal Canal Right Symptoms  Ageotrophic      Horizontal Canal Left   Horizontal Canal Left Duration  can view with gaze to the right    Horizontal Canal Left Symptoms  Ageotrophic  [place] Adult PT Treatment/Exercise - 01/28/19 1696      Neuro Re-ed    Neuro Re-ed Details   Corner balance exercises with eyes closed and feet apart; progressed to feet together however had significantly increased sway.      Vestibular Treatment/Exercise - 01/28/19 0901      Vestibular Treatment/Exercise   Vestibular Treatment Provided  Canalith Repositioning;Gaze;Habituation    Canalith Repositioning  Canal Roll Right    Habituation Exercises  [employer]    Gaze Exercises  X1 Viewing Horizontal;X1 Viewing Vertical      Canal Roll Right   Number of Reps   1    Response Details   Mild dizziness and nausea present after maneuver.  Performed Kim, 2011 for horiz cupulolithiasis using vibration at 1st and 4th positions.       Nestor Lewandowsky   Number of Reps   1    Symptom Description   reviewed with patient continuing with mild vertigo (low intensity)      X1 Viewing Horizontal    Foot Position  seated, then progressed to standing with feet apart    Comments  60 seconds with greater symptoms in horizonal plane.      X1 Viewing Vertical   Foot Position  seated, then progressed to standing with feet apart    Comments  60 seconds             PT Education - 01/28/19 1652    Education Details  Reviewed modified brandt daroff; added medbridge HEP for gaze, balance    Person(s) Educated  Patient    Methods  Explanation;Demonstration;Handout    Comprehension  Verbalized understanding;Returned demonstration;Need further instruction          PT Long Term Goals - 01/28/19 1657      PT LONG TERM GOAL #1   Title  Pt. will have  (-) positional testing for BPPV, indicating resolved (Target all LTGs 02/16/2019)    Time  4    Period  Weeks    Status  New      PT LONG TERM GOAL #2   Title  Patient will complete further vestibular assessments once vertigo cleared (if continues to have dizziness)--Head impulse test, DVA, DGI. Goals to be set as appropriate    Baseline  01/28/2019:  Tested to begin HEP due to multi-canal presentation; + head impulse test bilaterally and 5 line difference on SVA versus DVA.      Time  4    Period  Weeks    Status  New      PT LONG TERM GOAL #3   Title  Independent in HEP    Time  4    Period  Weeks    Status  New      PT LONG TERM GOAL #4   Title  Improve DVA from 5 line to < or equal to 3 line difference to demo adaptation of the VOR.    Time  4    Period  Weeks    Status  New    Target Date  02/16/19            Plan - 01/28/19 1701    Clinical Impression Statement  The patient continues with nystagmus that do not follow a completely positional pattern.  The nystagmus seen with L dix hallpike appear short duration and consistent with L posterior canal BPPV.  With R dix hallpike, she has a downbeat, R rotary nystagmus that is low amplitude, longer  duration today.  In addition, with horizontal roll, she has an apogeotropic  nystagmus greater to the left side (not seen with eyes in primary position).   Due to variable nytagmus with testing, proceeded with HiT that may indicate bilateral vestibular weakness, as well as 5 line SVA versus DVA difference.  PT initiated HEP for gaze adaptation and balance with vestibular inputs.     PT Treatment/Interventions  ADLs/Self Care Home Management;Canalith Repostioning;DME Instruction;Balance training;Therapeutic exercise;Therapeutic activities;Functional mobility training;Gait training;Neuromuscular re-education;Patient/family education;Passive range of motion;Vestibular;Visual/perceptual remediation/compensation    PT Next Visit Plan  Review HEP (gaze, balance, and modified brandt daroff);  ?reassess positional testing?; high level balance, habituation.    Consulted and Agree with Plan of Care  Patient       Patient will benefit from skilled therapeutic intervention in order to improve the following deficits and impairments:  Decreased activity tolerance, Decreased balance, Decreased knowledge of use of DME, Decreased mobility, Decreased safety awareness, Difficulty walking, Dizziness, Impaired vision/preception  Visit Diagnosis: BPPV (benign paroxysmal positional vertigo), bilateral  Dizziness and giddiness  Other symptoms and signs involving the nervous system  Unsteadiness on feet     Problem List Patient Active Problem List   Diagnosis Date Noted  . Rhytides 02/10/2014  . 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 Other reasons for seeking consultation 09/09/2013  . Symptomatic cholelithiasis 09/02/2013  . Anemia 12/23/2008  . ANXIETY DEPRESSION 12/23/2008  . Unspecified essential hypertension 11/22/2007  . PAIN IN JOINT, SITE UNSPECIFIED 11/22/2007    Audriana Aldama, PT 01/28/2019, 5:10 PM  [facility] [address] [place], [place], [address] Phone: [phone]   Fax:  [phone]  Name:  KARLYNN FURROW MRN: [id number] Date of Birth: 1946/04/04

## 2019-01-28 NOTE — Patient Instructions (Signed)
Access Code: [email or web address]  URL: [email or web address]  Date: 01/28/2019  Prepared by: Rudell Cobb   Exercises Standing Gaze Stabilization with Head Rotation - 1 reps - 2 sets - 60 seconds hold - 3x daily - 7x weekly Standing Gaze Stabilization with Head Nod - 1 reps - 2 sets - 60 seconds hold - 3x daily - 7x weekly Romberg Stance Eyes Closed on Foam Pad - 3 reps - 1 sets - 1-2x daily - 7x weekly

## 2019-01-30 ENCOUNTER — Encounter: Payer: Commercial Managed Care - HMO | Admitting: Rehabilitative and Restorative Service Providers"

## 2019-02-01 ENCOUNTER — Encounter

## 2019-02-05 ENCOUNTER — Encounter: Payer: Commercial Managed Care - HMO | Admitting: Physical Therapy

## 2019-02-08 ENCOUNTER — Encounter: Payer: Commercial Managed Care - HMO | Admitting: Rehabilitative and Restorative Service Providers"

## 2019-02-14 ENCOUNTER — Encounter: Payer: Self-pay | Admitting: Physical Therapy

## 2019-02-14 ENCOUNTER — Ambulatory Visit: Payer: Medicare Other | Attending: Internal Medicine | Admitting: Physical Therapy

## 2019-02-14 DIAGNOSIS — R2681 Unsteadiness on feet: Secondary | ICD-10-CM | POA: Diagnosis present

## 2019-02-14 DIAGNOSIS — R42 Dizziness and giddiness: Secondary | ICD-10-CM | POA: Diagnosis present

## 2019-02-14 DIAGNOSIS — H8113 Benign paroxysmal vertigo, bilateral: Secondary | ICD-10-CM | POA: Insufficient documentation

## 2019-02-14 NOTE — Patient Instructions (Signed)
Access Code: [email or web address]  URL: [email or web address]  Date: 02/14/2019  Prepared by: Barry Brunner   Exercises  Standing Gaze Stabilization with Head Rotation - 1 reps - 2 sets - 60 seconds hold - 3x daily - 7x weekly  Standing Gaze Stabilization with Head Nod - 1 reps - 2 sets - 60 seconds hold - 3x daily - 7x weekly  Romberg Stance Eyes Closed on Foam Pad - 3 reps - 1 sets - 1-2x daily - 7x weekly

## 2019-02-14 NOTE — Therapy (Signed)
[facility] [address] [place], [place], [address] Phone: [phone]   Fax:  [phone]  Physical Therapy Treatment  Patient Details  Name: Christine [name] MRN: [id number] Date of Birth: 1946/02/24 Referring Provider (PT): Link Snuffer  MD   Encounter Date: 02/14/2019  PT End of Session - 02/14/19 0932    Visit Number  4    Number of Visits  5    Date for PT Re-Evaluation  02/16/19    Authorization Type  [employer] HMO; $35 copay; VL:MN    PT Start Time  0933    PT Stop Time  1018    PT Time Calculation (min)  45 min    Activity Tolerance  Patient tolerated treatment well    Behavior During Therapy  [facility] for tasks assessed/performed       Past Medical History:  Diagnosis Date  . Anxiety   . Arthritis    hands  . 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 Deep venous thrombosis of calf ([place]) 2002  . Depression   . Diverticulosis   . History of blood transfusion 2009  . Hypertension   . Osteopenia   . PONV (postoperative nausea and vomiting)    nausea only yrs ago  . Tubular adenoma of colon 2011    Past Surgical History:  Procedure Laterality Date  . ABDOMINAL HYSTERECTOMY  age 84  . cataract surgery Bilateral 2011  . CHOLECYSTECTOMY N/A 09/20/2013   Procedure: LAPAROSCOPIC CHOLECYSTECTOMY WITH INTRAOPERATIVE CHOLANGIOGRAM;  Surgeon: Odis Hollingshead, MD;  Location: [facility];  Service: General;  Laterality: N/A;  . KNEE ARTHROSCOPY     left knee  . REPLACEMENT TOTAL KNEE Left 2006    There were no vitals filed for this visit.  Subjective Assessment - 02/14/19 0932    Subjective  I think I'm doing better. I think I'm tolerating the exercises better. Reports she does not always have time to do the modified Brandt-Daroff exercises, but is doing the VORx1 exercise multiple times per day.     Pertinent History  anxiety, arthritis, DVT '02, HTN, osteopenia, colon Ca; BPPV and underwent PT    Diagnostic  tests  saw ENT--BPPV like but Dix-Hallpike showed horizontal nystagmus    Patient Stated Goals  be done with this dizziness and imbalance    Currently in Pain?  No/denies             Vestibular Assessment - 02/14/19 0001      Horizontal Canal Right   Horizontal Canal Right Duration  >1 minute    Horizontal Canal Right Symptoms  Ageotrophic      Horizontal Canal Left   Horizontal Canal Left Duration  45 sec     Horizontal Canal Left Symptoms  Ageotrophic      Horizontal Canal Right Intensity   Horizontal Canal Right Intensity  Mild      Horizontal Canal Left Intensity   Horizontal Canal Left Intensity  Mild    Left Intensity Comment  stated less severe symptoms than when head turned to rt                Vestibular Treatment/Exercise - 02/14/19 2030      Vestibular Treatment/Exercise   Vestibular Treatment Provided  Canalith Repositioning;Gaze;Habituation    Canalith Repositioning  Canal Roll Left   ttreating lt horizontal cupulolithiasis   Habituation Exercises  Nestor Lewandowsky    Gaze Exercises  X1 Viewing Horizontal;X1 Viewing Vertical  Canal Roll Left   Number of Reps   3    Overall Response   Improved Symptoms    Response Details   tested supine head roll test after 1st and 2nd rep with intensity and duration less       Nestor Lewandowsky   Symptom Description   reviewed rationale for Brandt-Daroff and frequency recommended; continue to modify with head turned to right throughout (when lying to her left and to her right)      X1 Viewing Horizontal   Foot Position  standing    Reps  5    Comments  30 seconds or less with vc to stop when target appears to move (pt reported she didn't understand that from previous education); also to go slow enough to keep target from appearing to move      X1 Viewing Vertical   Foot Position  standing    Reps  2    Comments  30 seconds or less with vc to stop when target appears to move (pt reported she didn't understand  that from previous education); also to go slow enough to keep target from appearing to move                 PT Long Term Goals - 01/28/19 1657      PT LONG TERM GOAL #1   Title  Pt. will have  (-) positional testing for BPPV, indicating resolved (Target all LTGs 02/16/2019)    Time  4    Period  Weeks    Status  New      PT LONG TERM GOAL #2   Title  Patient will complete further vestibular assessments once vertigo cleared (if continues to have dizziness)--Head impulse test, DVA, DGI. Goals to be set as appropriate    Baseline  01/28/2019:  Tested to begin HEP due to multi-canal presentation; + head impulse test bilaterally and 5 line difference on SVA versus DVA.      Time  4    Period  Weeks    Status  New      PT LONG TERM GOAL #3   Title  Independent in HEP    Time  4    Period  Weeks    Status  New      PT LONG TERM GOAL #4   Title  Improve DVA from 5 line to < or equal to 3 line difference to demo adaptation of the VOR.    Time  4    Period  Weeks    Status  New    Target Date  02/16/19            Plan - 02/14/19 2042    Clinical Impression Statement  Patient continued with ageotropic nystagmus lasting >60 seconds with supine head roll test. Her nystagmus was less severe with head turned to the left and based on duration was treated for left cupulolithiasis with 3 fast BBQ-roll maneuvers. Intensity of nystagmus was less after each maneuver. Updated VORx1 (clarified technique) and reiterated importance and roll of modified Brandt-Daroff exercise.     PT Treatment/Interventions  ADLs/Self Care Home Management;Canalith Repostioning;DME Instruction;Balance training;Therapeutic exercise;Therapeutic activities;Functional mobility training;Gait training;Neuromuscular re-education;Patient/family education;Passive range of motion;Vestibular;Visual/perceptual remediation/compensation    PT Next Visit Plan  chk LTGs; reassess positional testing; Review HEP (gaze, balance,  and modified brandt daroff); high level balance, habituation. ? make additional appts at [facility] and Agree with Plan of Care  Patient  Patient will benefit from skilled therapeutic intervention in order to improve the following deficits and impairments:  Decreased activity tolerance, Decreased balance, Decreased knowledge of use of DME, Decreased mobility, Decreased safety awareness, Difficulty walking, Dizziness, Impaired vision/preception  Visit Diagnosis: BPPV (benign paroxysmal positional vertigo), bilateral  Dizziness and giddiness     Problem List Patient Active Problem List   Diagnosis Date Noted  . Rhytides 02/10/2014  . 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 Other reasons for seeking consultation 09/09/2013  . Symptomatic cholelithiasis 09/02/2013  . Anemia 12/23/2008  . ANXIETY DEPRESSION 12/23/2008  . Unspecified essential hypertension 11/22/2007  . PAIN IN JOINT, SITE UNSPECIFIED 11/22/2007    Rexanne Mano, PT 02/14/2019, 8:51 PM  [facility] [address] [place], [place], [address] Phone: [phone]   Fax:  [phone]  Name: LATREECE MOCHIZUKI MRN: [id number] Date of Birth: 1945-12-22

## 2019-02-21 ENCOUNTER — Other Ambulatory Visit: Payer: Self-pay

## 2019-02-21 ENCOUNTER — Encounter: Payer: Self-pay | Admitting: Physical Therapy

## 2019-02-21 ENCOUNTER — Ambulatory Visit: Payer: Medicare Other | Admitting: Physical Therapy

## 2019-02-21 DIAGNOSIS — R2681 Unsteadiness on feet: Secondary | ICD-10-CM

## 2019-02-21 DIAGNOSIS — H8113 Benign paroxysmal vertigo, bilateral: Secondary | ICD-10-CM

## 2019-02-21 DIAGNOSIS — R42 Dizziness and giddiness: Secondary | ICD-10-CM

## 2019-02-21 NOTE — Patient Instructions (Signed)
Access Code: [email or web address]  URL: [email or web address]  Date: 02/21/2019  Prepared by: Barry Brunner   Exercises  Standing Gaze Stabilization with Head Rotation - 1 reps - 2 sets - 60 seconds hold - 3x daily - 7x weekly  Standing Gaze Stabilization with Head Nod - 1 reps - 2 sets - 60 seconds hold - 3x daily - 7x weekly  Romberg Stance Eyes Closed on Foam Pad - 3 reps - 1 sets - 1-2x daily - 7x weekly

## 2019-02-21 NOTE — Therapy (Signed)
[facility] [address] [place], [place], [address] Phone: [phone]   Fax:  [phone]  Physical Therapy Treatment  Patient Details  Name: Christine [name] MRN: [id number] Date of Birth: December 14, 1945 Referring Provider (PT): Link Snuffer  MD   Encounter Date: 02/21/2019  PT End of Session - 02/21/19 2039    Visit Number  5    Number of Visits  13    Date for PT Re-Evaluation  04/22/19    Authorization Type  [employer]HC HMO; $35 copay; VL:MN    PT Start Time  0933    PT Stop Time  1015    PT Time Calculation (min)  42 min    Activity Tolerance  Patient tolerated treatment well    Behavior During Therapy  [facility] for tasks assessed/performed       Past Medical History:  Diagnosis Date  . Anxiety   . Arthritis    hands  . 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 Deep venous thrombosis of calf ([place]) 2002  . Depression   . Diverticulosis   . History of blood transfusion 2009  . Hypertension   . Osteopenia   . PONV (postoperative nausea and vomiting)    nausea only yrs ago  . Tubular adenoma of colon 2011    Past Surgical History:  Procedure Laterality Date  . ABDOMINAL HYSTERECTOMY  age 49  . cataract surgery Bilateral 2011  . CHOLECYSTECTOMY N/A 09/20/2013   Procedure: LAPAROSCOPIC CHOLECYSTECTOMY WITH INTRAOPERATIVE CHOLANGIOGRAM;  Surgeon: Odis Hollingshead, MD;  [facility];  Service: General;  Laterality: N/A;  . KNEE ARTHROSCOPY     left knee  . REPLACEMENT TOTAL KNEE Left 2006    There were no vitals filed for this visit.  Subjective Assessment - 02/21/19 2032    Subjective  Doing better. Not feeling as dizzy. Has been doing Brandt-Daroff and doesn't feel as bad. Was on a ladder yesterday and felt off balance and had to hold onto shelf. Able to descend steps without symptoms. No dizziness     Pertinent History  anxiety, arthritis, DVT '02, HTN, osteopenia, colon Ca; BPPV and underwent PT    Diagnostic tests  saw ENT--BPPV like but Dix-Hallpike showed horizontal nystagmus    Patient Stated Goals  be done with this dizziness and imbalance    Currently in Pain?  No/denies             Vestibular Assessment - 02/21/19 0001      Horizontal Canal Right   Horizontal Canal Right Duration  >1 minute    Horizontal Canal Right Symptoms  Ageotrophic      Horizontal Canal Left   Horizontal Canal Left Duration  20 sec    Horizontal Canal Left Symptoms  Ageotrophic      Horizontal Canal Right Intensity   Horizontal Canal Right Intensity  Mild      Horizontal Canal Left Intensity   Horizontal Canal Left Intensity  --   Very mild               Vestibular Treatment/Exercise - 02/21/19 0001      Vestibular Treatment/Exercise   Vestibular Treatment Provided  Canalith Repositioning    Canalith Repositioning  Comment   Modified Casani for lt   Gaze Exercises  X1 Viewing Horizontal      OTHER   Comment  modified Casani for lt horzontal canal cupulolithiasis      X1 Viewing Horizontal   Foot Position  standing    Reps  3    Comments  30-60 sec (narrowing BOS, incr speed of head turn)         Balance Exercises - 02/21/19 2033      Balance Exercises: Standing   Standing Eyes Opened  Narrow base of support (BOS);Solid surface;Head turns    Standing Eyes Closed  Narrow base of support (BOS);Solid surface;Foam/compliant surface    Balance Beam  black beam crosswise, feet apart, EO head turns, head nods; EC 30 sec with mild sway and recovery    Gait with Head Turns  Forward;Retro;Intermittent upper extremity support    Tandem Gait  Forward;Retro    Marching Limitations  slow march        PT Education - 02/21/19 2037    Education Details  addition to HEP; rationale for adding more PT visits    Person(s) Educated  Patient    Methods  Explanation;Demonstration;Verbal cues;Handout    Comprehension  Verbalized understanding;Returned demonstration;Verbal cues  required        02/21/19 2041  PT LONG TERM GOAL #1  Title Pt. will have  (-) positional testing for BPPV, indicating resolved (Target all LTGs 02/16/2019)  Baseline 02/21/19 +ageotropic horizontal cupulolithiasis  Time 4  Period Weeks  Status Not Met  PT LONG TERM GOAL #2  Title Patient will complete further vestibular assessments once vertigo cleared (if continues to have dizziness)--Head impulse test, DVA, DGI. Goals to be set as appropriate  Baseline 01/28/2019:  Tested to begin HEP due to multi-canal presentation; + head impulse test bilaterally and 5 line difference on SVA versus DVA.    Time 4  Period Weeks  Status Achieved  PT LONG TERM GOAL #3  Title Independent in HEP  Time 4  Period Weeks  Status Achieved  PT LONG TERM GOAL #4  Title Improve DVA from 5 line to < or equal to 3 line difference to demo adaptation of the VOR.  Baseline 02/21/19  EMR unavailable during her session and did not recall this goal to assess  Time 4  Period Weeks  Status Unable to assess     NEW Goals:   PT Short Term Goals - 02/21/19 2115      PT SHORT TERM GOAL #1   Title  Improve DVA from 5 line to < or equal to 3 line difference to demo adaptation of the VOR. (Target all STGs: 4 weeks, 03/23/2019)      PT SHORT TERM GOAL #2   Title  Patient independent with HEP        PT Long Term Goals - 02/21/19 2113      PT LONG TERM GOAL #1   Title  Pt. will have  (-) positional testing for BPPV, indicating resolved (Target all LTGs: 8 weeks, 04/22/2019)    Status  On-going      PT LONG TERM GOAL #2   Title  Patient will score >=19 on DGI to demonstrate lesser fall risk.            Plan - 02/21/19 2039    Clinical Impression Statement  Initiated session with balance activities with pt improved from previous visits, however still clearly off-balance. Reported yesterday she felt very unsteady and had to grab hold of shelf to prevent fall off short step-ladder. LTGs assessed with pt meeting  2 of 4 goals, one goal was not met (continues to have + positional testing for BPPV) and final goal was not assessed (EMR was not working  and did not recall this goal to be measured). Patient reports she is better, however with imbalance on step ladder yesterday, she doesn't feel ready to discharge. Will recertify for 8 weeks at 1x/week with hopes to discharge in less than 8 weeks.     Rehab Potential  Good    Clinical Impairments Affecting Rehab Potential  duration of time since onset makes hypofunction complication more likely    PT Frequency  1x / week    PT Duration  8 weeks    PT Treatment/Interventions  ADLs/Self Care Home Management;Canalith Repostioning;DME Instruction;Balance training;Therapeutic exercise;Therapeutic activities;Functional mobility training;Gait training;Neuromuscular re-education;Patient/family education;Passive range of motion;Vestibular;Visual/perceptual remediation/compensation;Stair training    PT Next Visit Plan   reassess positional testing; Review HEP (gaze, balance, and modified brandt daroff); high level balance, habituation. ? make additional appts at [facility] and Agree with Plan of Care  Patient       Patient will benefit from skilled therapeutic intervention in order to improve the following deficits and impairments:  Decreased activity tolerance, Decreased balance, Decreased knowledge of use of DME, Decreased mobility, Decreased safety awareness, Difficulty walking, Dizziness, Impaired vision/preception  Visit Diagnosis: BPPV (benign paroxysmal positional vertigo), bilateral  Dizziness and giddiness  Unsteadiness on feet     Problem List Patient Active Problem List   Diagnosis Date Noted  . Rhytides 02/10/2014  . 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 Other reasons for seeking consultation 09/09/2013  . Symptomatic cholelithiasis 09/02/2013  . Anemia 12/23/2008  . ANXIETY DEPRESSION 12/23/2008  . Unspecified essential  hypertension 11/22/2007  . PAIN IN JOINT, SITE UNSPECIFIED 11/22/2007    Rexanne Mano, PT 02/21/2019, 9:20 PM  [facility] [address] [place], [place], [address] Phone: [phone]   Fax:  [phone]  Name: Christine [name] MRN: [id number] Date of Birth: [birth year]

## 2019-03-05 ENCOUNTER — Telehealth: Payer: Self-pay | Admitting: Physical Therapy

## 2019-03-05 NOTE — Telephone Encounter (Signed)
Christine [name] was contacted today regarding the temporary closing of OP Rehab Services due to Covid-19.  Therapist discussed:   1) continue to do her HEP and assured she had no questions at this time  Patient is interested in further information for an e-visit, virtual check in, or telehealth visit, if those services become available.    OP Rehabilitation Services will follow up with patients when we are able to resume care.  Barry Brunner, [facility] [address] [place], [place]  [address] Phone:  [phone] Fax:  [phone] \

## 2019-03-07 ENCOUNTER — Ambulatory Visit: Payer: Medicare Other | Admitting: Rehabilitative and Restorative Service Providers"

## 2019-03-11 ENCOUNTER — Encounter: Payer: Medicare Other | Admitting: Physical Therapy

## 2019-03-15 ENCOUNTER — Telehealth: Payer: Self-pay | Admitting: Physical Therapy

## 2019-03-15 NOTE — Telephone Encounter (Signed)
Christine [name] was contacted today regarding temporary reduction of Outpatient [facility] Services due to concerns for community transmission of COVID-19.  Patient identity was verified.  Assessed if patient needed to be seen in person by clinician (recent fall or acute injury that requires hands on assessment and advice, change in diet order, post-surgical, special cases, etc.).     Patient did not have an acute/special need that requires in person visit. Proceeded with phone call.  Therapist advised the patient to continue to perform his/her HEP and assured he/she had no unanswered questions or concerns at this time.  The patient was offered and declined the continuation of their plan of care by using methods such as an E-Visit, virtual check in, or Telehealth visit.  [facility] will follow up with this client when we are able to safely resume care at the [facility] in person.  Patient is aware we can be reached by telephone during limited business hours in the meantime.    Guido Sander, PT [facility] [address] [place], [place] [address] [phone]

## 2019-03-19 ENCOUNTER — Encounter: Payer: Medicare Other | Admitting: Physical Therapy

## 2019-03-25 ENCOUNTER — Encounter: Payer: Medicare Other | Admitting: Physical Therapy

## 2019-04-01 ENCOUNTER — Encounter: Payer: Medicare Other | Admitting: Physical Therapy

## 2019-04-08 ENCOUNTER — Encounter: Payer: Medicare Other | Admitting: Rehabilitative and Restorative Service Providers"

## 2019-04-15 ENCOUNTER — Other Ambulatory Visit (HOSPITAL_COMMUNITY): Payer: Self-pay | Admitting: *Deleted

## 2019-04-15 ENCOUNTER — Encounter: Payer: Medicare Other | Admitting: Physical Therapy

## 2019-04-16 ENCOUNTER — Other Ambulatory Visit: Payer: Self-pay

## 2019-04-16 ENCOUNTER — Ambulatory Visit (HOSPITAL_COMMUNITY)
Admission: RE | Admit: 2019-04-16 | Discharge: 2019-04-16 | Disposition: A | Discharge status: Home / Self Care | Payer: Medicare Other | Source: Ambulatory Visit | Attending: Internal Medicine | Admitting: Internal Medicine

## 2019-04-16 DIAGNOSIS — M810 Age-related osteoporosis without current pathological fracture: Secondary | ICD-10-CM | POA: Insufficient documentation

## 2019-04-16 MED ORDER — ZOLEDRONIC ACID 5 MG/100ML IV SOLN
5.0000 mg | Freq: Once | INTRAVENOUS | Status: AC
  Administered 2019-04-16: 10:00:00 5 mg via INTRAVENOUS

## 2019-04-16 MED ORDER — ZOLEDRONIC ACID 5 MG/100ML IV SOLN
INTRAVENOUS | Status: AC
  Administered 2019-04-16: 5 mg via INTRAVENOUS
  Filled 2019-04-16: qty 100

## 2019-04-22 ENCOUNTER — Encounter: Payer: Medicare Other | Admitting: Physical Therapy

## 2019-04-29 ENCOUNTER — Encounter: Payer: Medicare Other | Admitting: Physical Therapy

## 2019-05-20 ENCOUNTER — Other Ambulatory Visit: Payer: Self-pay

## 2019-05-20 ENCOUNTER — Encounter: Payer: Self-pay | Admitting: Physical Therapy

## 2019-05-20 ENCOUNTER — Ambulatory Visit: Payer: Medicare Other | Attending: Internal Medicine | Admitting: Physical Therapy

## 2019-05-20 DIAGNOSIS — H8113 Benign paroxysmal vertigo, bilateral: Secondary | ICD-10-CM | POA: Diagnosis present

## 2019-05-20 DIAGNOSIS — R2681 Unsteadiness on feet: Secondary | ICD-10-CM | POA: Diagnosis present

## 2019-05-20 DIAGNOSIS — R42 Dizziness and giddiness: Secondary | ICD-10-CM

## 2019-05-20 NOTE — Patient Instructions (Signed)
Habituation - Tip Card  1.The goal of habituation training is to assist in decreasing symptoms of vertigo, dizziness, or nausea provoked by specific head and body motions. 2.These exercises may initially increase symptoms; however, be persistent and work through symptoms. With repetition and time, the exercises will assist in reducing or eliminating symptoms. 3.Exercises should be stopped and discussed with the therapist if you experience any of the following: - Sudden change or fluctuation in hearing - New onset of ringing in the ears, or increase in current intensity - Any fluid discharge from the ear - Severe pain in neck or back - Extreme nausea  Copyright  VHI. All rights reserved.   Habituation - Rolling   With pillow under head, start on back. Roll to your right side.  Hold until dizziness stops, plus 20 seconds and then roll to the left side.  Hold until dizziness stops, plus 20 seconds.  Repeat sequence 3 times per session. Do 2 sessions per day (morning and night).

## 2019-05-20 NOTE — Therapy (Signed)
[facility] [address] [place], [place], [address] Phone: [phone]   Fax:  [phone]  Physical Therapy Treatment  Patient Details  Name: Christine [name] MRN: [id number] Date of Birth: 1946/04/14 Referring Provider (PT): Link Snuffer  MD   Encounter Date: 05/20/2019   CLINIC OPERATION CHANGES: Outpatient Neuro Rehab is open at lower capacity following universal masking, social distancing, and patient screening.  The patient's COVID risk of complications score is 3.   PT End of Session - 05/20/19 2211    Visit Number  6    Number of Visits  12    Date for PT Re-Evaluation  07/19/19    Authorization Type  [employer] HMO; $35 copay; VL:MN    PT Start Time  5277   arrived late   PT Stop Time  1439    PT Time Calculation (min)  32 min    Activity Tolerance  Patient tolerated treatment well    Behavior During Therapy  WFL for tasks assessed/performed       Past Medical History:  Diagnosis Date  . Anxiety   . Arthritis    hands  . 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 Deep venous thrombosis of calf ([place]) 2002  . Depression   . Diverticulosis   . History of blood transfusion 2009  . Hypertension   . Osteopenia   . PONV (postoperative nausea and vomiting)    nausea only yrs ago  . Tubular adenoma of colon 2011    Past Surgical History:  Procedure Laterality Date  . ABDOMINAL HYSTERECTOMY  age 58  . cataract surgery Bilateral 2011  . CHOLECYSTECTOMY N/A 09/20/2013   Procedure: LAPAROSCOPIC CHOLECYSTECTOMY WITH INTRAOPERATIVE CHOLANGIOGRAM;  Surgeon: Odis Hollingshead, MD;  Location: [facility];  Service: General;  Laterality: N/A;  . KNEE ARTHROSCOPY     left knee  . REPLACEMENT TOTAL KNEE Left 2006    There were no vitals filed for this visit.  Subjective Assessment - 05/20/19 1406    Subjective  Hasn't noticed any dizziness with bed mobility but wants to make sure the crystals are where they  supposed to be.  Has been keeping up with her letter exercises but not the [facility].  Had another episode of falling off chair; was standing on it to hang a picture.   No injuries.    Pertinent History  anxiety, arthritis, DVT '02, HTN, osteopenia, colon Ca; BPPV and underwent PT    Diagnostic tests  saw ENT--BPPV like but Dix-Hallpike showed horizontal nystagmus    Patient Stated Goals  be done with this dizziness and imbalance    Currently in Pain?  No/denies             Vestibular Assessment - 05/20/19 1411      Positional Testing   Dix-Hallpike  Dix-Hallpike Right;Dix-Hallpike Left    Horizontal Canal Testing  Horizontal Canal Right;Horizontal Canal Left      Dix-Hallpike Right   Dix-Hallpike Right Duration  0    Dix-Hallpike Right Symptoms  No nystagmus      Dix-Hallpike Left   Dix-Hallpike Left Duration  3 seconds    Dix-Hallpike Left Symptoms  Upbeat, left rotatory nystagmus      Horizontal Canal Right   Horizontal Canal Right Duration  >1 minute    Horizontal Canal Right Symptoms  Ageotrophic      Horizontal Canal Left   Horizontal Canal Left Duration  30 seconds    Horizontal Canal  Left Symptoms  Ageotrophic                Vestibular Treatment/Exercise - 05/20/19 1427      Vestibular Treatment/Exercise   Vestibular Treatment Provided  Canalith Repositioning;Habituation    Canalith Repositioning  Epley Manuever Left    Habituation Exercises  Horizontal Roll       EPLEY MANUEVER LEFT   Number of Reps   1    Overall Response   Symptoms Resolved     RESPONSE DETAILS LEFT  when re-tested, no upbeating or rotary nystagmus seen            PT Education - 05/20/19 2210    Education Details  Continued to educate pt on BPPV, repeated rolling for habituation    Person(s) Educated  Patient    Methods  Explanation;Demonstration;Handout    Comprehension  Verbalized understanding       PT Short Term Goals - 05/20/19 2222      PT SHORT TERM  GOAL #1   Title  = LTG        PT Long Term Goals - 05/20/19 2222      PT LONG TERM GOAL #1   Title  Pt. will have  (-) positional testing for BPPV, indicating resolved    Time  6    Period  Weeks    Status  Revised    Target Date  07/04/19      PT LONG TERM GOAL #2   Title  Patient will score >=19 on DGI to demonstrate lesser fall risk.    Time  6    Period  Weeks    Status  Revised    Target Date  07/04/19      PT LONG TERM GOAL #3   Title  Improve DVA from 5 line to < or equal to 3 line difference to demo adaptation of the VOR.    Time  6    Period  Weeks    Status  New    Target Date  07/04/19            Plan - 05/20/19 2213    Clinical Impression Statement  Pt returns after being on hold for vestibular rehab due to COVID restrictions.  Performed re-assessment of peripheral canals with pt continuing to present with vertigo and nystagmus during L hallpike-dix testing and apogeotropic nystagmus during horizontal canal testing indicating ongoing L posterior canal canalithiasis and horizontal canal cupulolithiasis.  Treated L posterior canal with 1 CRM with resolution of nystagmus and vertigo.  Provided pt with repeated rolling for habituation.  Unable to re-assess dynamic balance today but pt continues to experience LOB and falls from elevated surfaces and will benefit from continued skilled PT services to address vestibular and balance impairments to decrease falls risk.    Rehab Potential  Good    Clinical Impairments Affecting Rehab Potential  duration of time since onset makes hypofunction complication more likely    PT Frequency  1x / week    PT Duration  8 weeks    PT Treatment/Interventions  ADLs/Self Care Home Management;Canalith Repostioning;DME Instruction;Balance training;Therapeutic exercise;Therapeutic activities;Functional mobility training;Gait training;Neuromuscular re-education;Patient/family education;Passive range of motion;Vestibular;Visual/perceptual  remediation/compensation;Stair training    PT Next Visit Plan  how has repeated rolling being going?  re-test L posterior canal.  treat L horizontal canal cupulolithiasis.  Re-assess DGI and DVA.  D/C in next 2 visits?    Consulted and Agree with Plan of Care  Patient  Patient will benefit from skilled therapeutic intervention in order to improve the following deficits and impairments:  Decreased activity tolerance, Decreased balance, Decreased knowledge of use of DME, Decreased mobility, Decreased safety awareness, Difficulty walking, Dizziness, Impaired vision/preception  Visit Diagnosis: BPPV (benign paroxysmal positional vertigo), bilateral  Dizziness and giddiness  Unsteadiness on feet     Problem List Patient Active Problem List   Diagnosis Date Noted  . [place]ides 02/10/2014  . 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 Other reasons for seeking consultation 09/09/2013  . Symptomatic cholelithiasis 09/02/2013  . Anemia 12/23/2008  . ANXIETY DEPRESSION 12/23/2008  . Unspecified essential hypertension 11/22/2007  . PAIN IN JOINT, SITE UNSPECIFIED 11/22/2007   Rico Junker, PT, DPT 05/20/19    10:25 PM    [facility] [address] [place], [place], [address] Phone: [phone]   Fax:  [phone]  Name: Christine [name] MRN: [id number] Date of Birth: July 27, 1946

## 2019-05-30 ENCOUNTER — Ambulatory Visit: Payer: Medicare Other | Admitting: Physical Therapy

## 2019-10-07 ENCOUNTER — Encounter (HOSPITAL_COMMUNITY): Payer: Self-pay

## 2019-10-07 ENCOUNTER — Other Ambulatory Visit: Payer: Self-pay

## 2019-10-07 ENCOUNTER — Inpatient Hospital Stay (HOSPITAL_COMMUNITY)
Admission: EM | Admit: 2019-10-07 | Discharge: 2019-10-12 | DRG: 177 | Disposition: A | Discharge status: Home / Self Care | Payer: Medicare Other | Attending: Internal Medicine | Admitting: Internal Medicine

## 2019-10-07 ENCOUNTER — Emergency Department (HOSPITAL_COMMUNITY): Payer: Medicare Other

## 2019-10-07 ENCOUNTER — Inpatient Hospital Stay (HOSPITAL_COMMUNITY): Payer: Medicare Other

## 2019-10-07 DIAGNOSIS — Z9049 Acquired absence of other specified parts of digestive tract: Secondary | ICD-10-CM

## 2019-10-07 DIAGNOSIS — Z86718 Personal history of other venous thrombosis and embolism: Secondary | ICD-10-CM | POA: Diagnosis not present

## 2019-10-07 DIAGNOSIS — R161 Splenomegaly, not elsewhere classified: Secondary | ICD-10-CM | POA: Diagnosis present

## 2019-10-07 DIAGNOSIS — I951 Orthostatic hypotension: Secondary | ICD-10-CM | POA: Diagnosis present

## 2019-10-07 DIAGNOSIS — J1289 Other viral pneumonia: Secondary | ICD-10-CM | POA: Diagnosis present

## 2019-10-07 DIAGNOSIS — F418 Other specified anxiety disorders: Secondary | ICD-10-CM

## 2019-10-07 DIAGNOSIS — E039 Hypothyroidism, unspecified: Secondary | ICD-10-CM | POA: Diagnosis present

## 2019-10-07 DIAGNOSIS — F419 Anxiety disorder, unspecified: Secondary | ICD-10-CM | POA: Diagnosis present

## 2019-10-07 DIAGNOSIS — D638 Anemia in other chronic diseases classified elsewhere: Secondary | ICD-10-CM | POA: Diagnosis present

## 2019-10-07 DIAGNOSIS — Z8249 Family history of ischemic heart disease and other diseases of the circulatory system: Secondary | ICD-10-CM | POA: Diagnosis not present

## 2019-10-07 DIAGNOSIS — D649 Anemia, unspecified: Secondary | ICD-10-CM | POA: Diagnosis present

## 2019-10-07 DIAGNOSIS — R7989 Other specified abnormal findings of blood chemistry: Secondary | ICD-10-CM | POA: Diagnosis not present

## 2019-10-07 DIAGNOSIS — Z79899 Other long term (current) drug therapy: Secondary | ICD-10-CM | POA: Diagnosis not present

## 2019-10-07 DIAGNOSIS — R7401 Elevation of levels of liver transaminase levels: Secondary | ICD-10-CM | POA: Diagnosis present

## 2019-10-07 DIAGNOSIS — I10 Essential (primary) hypertension: Secondary | ICD-10-CM | POA: Diagnosis present

## 2019-10-07 DIAGNOSIS — Z91030 Bee allergy status: Secondary | ICD-10-CM | POA: Diagnosis not present

## 2019-10-07 DIAGNOSIS — Z79890 Hormone replacement therapy (postmenopausal): Secondary | ICD-10-CM

## 2019-10-07 DIAGNOSIS — R531 Weakness: Secondary | ICD-10-CM | POA: Diagnosis not present

## 2019-10-07 DIAGNOSIS — Z96652 Presence of left artificial knee joint: Secondary | ICD-10-CM | POA: Diagnosis present

## 2019-10-07 DIAGNOSIS — D5912 Cold autoimmune hemolytic anemia: Secondary | ICD-10-CM | POA: Diagnosis present

## 2019-10-07 DIAGNOSIS — Z90710 Acquired absence of both cervix and uterus: Secondary | ICD-10-CM | POA: Diagnosis not present

## 2019-10-07 DIAGNOSIS — Z87891 Personal history of nicotine dependence: Secondary | ICD-10-CM

## 2019-10-07 DIAGNOSIS — F329 Major depressive disorder, single episode, unspecified: Secondary | ICD-10-CM | POA: Diagnosis present

## 2019-10-07 DIAGNOSIS — U071 COVID-19: Secondary | ICD-10-CM | POA: Diagnosis not present

## 2019-10-07 DIAGNOSIS — M1990 Unspecified osteoarthritis, unspecified site: Secondary | ICD-10-CM | POA: Diagnosis present

## 2019-10-07 DIAGNOSIS — M129 Arthropathy, unspecified: Secondary | ICD-10-CM | POA: Diagnosis not present

## 2019-10-07 DIAGNOSIS — M8580 Other specified disorders of bone density and structure, unspecified site: Secondary | ICD-10-CM | POA: Diagnosis present

## 2019-10-07 DIAGNOSIS — R7402 Elevation of levels of lactic acid dehydrogenase (LDH): Secondary | ICD-10-CM | POA: Diagnosis present

## 2019-10-07 DIAGNOSIS — F341 Dysthymic disorder: Secondary | ICD-10-CM | POA: Diagnosis present

## 2019-10-07 LAB — CBC WITH DIFFERENTIAL/PLATELET
Abs Immature Granulocytes: 0.03 10*3/uL (ref 0.00–0.07)
Basophils Absolute: 0 10*3/uL (ref 0.0–0.1)
Basophils Relative: 0 %
Eosinophils Absolute: 0 10*3/uL (ref 0.0–0.5)
Eosinophils Relative: 0 %
HCT: 19.1 % — ABNORMAL LOW (ref 36.0–46.0)
Hemoglobin: 6.4 g/dL — CL (ref 12.0–15.0)
Immature Granulocytes: 1 %
Lymphocytes Relative: 15 %
Lymphs Abs: 0.6 10*3/uL — ABNORMAL LOW (ref 0.7–4.0)
MCH: 31.8 pg (ref 26.0–34.0)
MCHC: 33.5 g/dL (ref 30.0–36.0)
MCV: 95 fL (ref 80.0–100.0)
Monocytes Absolute: 0.3 10*3/uL (ref 0.1–1.0)
Monocytes Relative: 7 %
Neutro Abs: 3 10*3/uL (ref 1.7–7.7)
Neutrophils Relative %: 77 %
Platelets: 224 10*3/uL (ref 150–400)
RBC: 2.01 MIL/uL — ABNORMAL LOW (ref 3.87–5.11)
RDW: 15.8 % — ABNORMAL HIGH (ref 11.5–15.5)
WBC: 3.9 10*3/uL — ABNORMAL LOW (ref 4.0–10.5)
nRBC: 0 % (ref 0.0–0.2)

## 2019-10-07 LAB — COMPREHENSIVE METABOLIC PANEL
ALT: 64 U/L — ABNORMAL HIGH (ref 0–44)
AST: 64 U/L — ABNORMAL HIGH (ref 15–41)
Albumin: 3.3 g/dL — ABNORMAL LOW (ref 3.5–5.0)
Alkaline Phosphatase: 107 U/L (ref 38–126)
Anion gap: 8 (ref 5–15)
BUN: 12 mg/dL (ref 8–23)
CO2: 23 mmol/L (ref 22–32)
Calcium: 8.4 mg/dL — ABNORMAL LOW (ref 8.9–10.3)
Chloride: 102 mmol/L (ref 98–111)
Creatinine, Ser: 0.65 mg/dL (ref 0.44–1.00)
GFR calc Af Amer: >60 mL/min (ref >60)
GFR calc non Af Amer: >60 mL/min (ref >60)
Glucose, Bld: 108 mg/dL — ABNORMAL HIGH (ref 70–99)
Potassium: 3.8 mmol/L (ref 3.5–5.1)
Sodium: 133 mmol/L — ABNORMAL LOW (ref 135–145)
Total Bilirubin: 3.2 mg/dL — ABNORMAL HIGH (ref 0.3–1.2)
Total Protein: 5.8 g/dL — ABNORMAL LOW (ref 6.5–8.1)

## 2019-10-07 LAB — RETICULOCYTES
Immature Retic Fract: 19.8 % — ABNORMAL HIGH (ref 2.3–15.9)
RBC.: 2.01 MIL/uL — ABNORMAL LOW (ref 3.87–5.11)
Retic Count, Absolute: 85.2 10*3/uL (ref 19.0–186.0)
Retic Ct Pct: 4.2 % — ABNORMAL HIGH (ref 0.4–3.1)

## 2019-10-07 LAB — PREPARE RBC (CROSSMATCH)

## 2019-10-07 LAB — LACTATE DEHYDROGENASE: LDH: 273 U/L — ABNORMAL HIGH (ref 98–192)

## 2019-10-07 LAB — SARS CORONAVIRUS 2 (TAT 6-24 HRS): SARS Coronavirus 2: POSITIVE — AB

## 2019-10-07 MED ORDER — LEVOTHYROXINE SODIUM 88 MCG PO TABS
88.0000 ug | ORAL_TABLET | Freq: Every day | ORAL | Status: DC
  Administered 2019-10-08 – 2019-10-12 (×5): 88 ug via ORAL
  Filled 2019-10-07 (×6): qty 1

## 2019-10-07 MED ORDER — ONDANSETRON HCL 4 MG PO TABS: 4.0000 mg | ORAL_TABLET | Freq: Four times a day (QID) | ORAL | Status: DC | PRN

## 2019-10-07 MED ORDER — ACETAMINOPHEN 325 MG PO TABS
650.0000 mg | ORAL_TABLET | Freq: Four times a day (QID) | ORAL | Status: DC | PRN
  Administered 2019-10-07: 650 mg via ORAL
  Filled 2019-10-07: qty 2

## 2019-10-07 MED ORDER — SODIUM CHLORIDE 0.9 % IV BOLUS
500.0000 mL | Freq: Once | INTRAVENOUS | Status: AC
  Administered 2019-10-07: 500 mL via INTRAVENOUS

## 2019-10-07 MED ORDER — BUPROPION HCL ER (XL) 300 MG PO TB24
300.0000 mg | ORAL_TABLET | Freq: Every morning | ORAL | Status: DC
  Administered 2019-10-08 – 2019-10-12 (×5): 300 mg via ORAL
  Filled 2019-10-07 (×4): qty 1
  Filled 2019-10-07: qty 2
  Filled 2019-10-07: qty 1

## 2019-10-07 MED ORDER — SODIUM CHLORIDE 0.9% IV SOLUTION: Freq: Once | INTRAVENOUS | Status: DC

## 2019-10-07 MED ORDER — ONDANSETRON HCL 4 MG/2ML IJ SOLN: 4.0000 mg | Freq: Four times a day (QID) | INTRAMUSCULAR | Status: DC | PRN

## 2019-10-07 MED ORDER — DOCUSATE SODIUM 100 MG PO CAPS
100.0000 mg | ORAL_CAPSULE | Freq: Two times a day (BID) | ORAL | Status: DC
  Administered 2019-10-09: 100 mg via ORAL
  Filled 2019-10-07 (×3): qty 1

## 2019-10-07 MED ORDER — TRAMADOL HCL 50 MG PO TABS
50.0000 mg | ORAL_TABLET | Freq: Four times a day (QID) | ORAL | Status: DC | PRN
  Administered 2019-10-08: 50 mg via ORAL
  Filled 2019-10-07: qty 1

## 2019-10-07 MED ORDER — ACETAMINOPHEN 325 MG PO TABS: 650.0000 mg | ORAL_TABLET | Freq: Four times a day (QID) | ORAL | Status: DC | PRN

## 2019-10-07 MED ORDER — SODIUM CHLORIDE 0.9 % IV SOLN
INTRAVENOUS | Status: DC
  Administered 2019-10-07 – 2019-10-09 (×3): via INTRAVENOUS

## 2019-10-07 MED ORDER — SENNA 8.6 MG PO TABS
1.0000 | ORAL_TABLET | Freq: Two times a day (BID) | ORAL | Status: DC
  Administered 2019-10-09 (×2): 8.6 mg via ORAL
  Filled 2019-10-07 (×3): qty 1

## 2019-10-07 MED ORDER — SODIUM CHLORIDE 0.9% FLUSH
3.0000 mL | Freq: Once | INTRAVENOUS | Status: AC
  Administered 2019-10-07: 3 mL via INTRAVENOUS

## 2019-10-07 MED ORDER — ZOLPIDEM TARTRATE 10 MG PO TABS: 10.0000 mg | ORAL_TABLET | Freq: Every evening | ORAL | Status: DC | PRN

## 2019-10-07 MED ORDER — FOLIC ACID 1 MG PO TABS
2.0000 mg | ORAL_TABLET | Freq: Every day | ORAL | Status: DC
  Administered 2019-10-08 – 2019-10-12 (×5): 2 mg via ORAL
  Filled 2019-10-07 (×5): qty 2

## 2019-10-07 MED ORDER — BISACODYL 5 MG PO TBEC
5.0000 mg | DELAYED_RELEASE_TABLET | Freq: Every day | ORAL | Status: DC | PRN
  Administered 2019-10-10: 5 mg via ORAL
  Filled 2019-10-07: qty 1

## 2019-10-07 MED ORDER — IOHEXOL 300 MG/ML  SOLN
100.0000 mL | Freq: Once | INTRAMUSCULAR | Status: AC | PRN
  Administered 2019-10-07: 100 mL via INTRAVENOUS

## 2019-10-07 NOTE — ED Provider Notes (Signed)
[facility] EMERGENCY DEPARTMENT Provider Note   CSN: [id number] Arrival date & time: 10/07/19  1521     History   Chief Complaint Chief Complaint  Patient presents with  . Dizziness  . Weakness    HPI Christine [name] is a 73 y.o. female.     Patient with history of cold agglutinin hemolytic anemia, lymphoproliferative disorder (4 week cycle of Rituxan 2015, followed by Dr. Marin Olp -- presents to the emergency department with complaint of weakness.  Patient saw her primary care doctor today, Dr. Brigitte Pulse.  Her hemoglobin in the office was found to be 7.2.  She also had a mild elevation in her total bilirubin.  She is orthostatic and gets very lightheaded when she tries to stand upright, for instance when she was getting an x-ray at the doctor's office.  She states that she has noted a dark color in her urine for the past day or 2.  She developed weakness 3 days ago and decreased appetite.  She has had a "rattling" cough.  She reports that the x-ray at the doctor's office was negative for pneumonia.  She has had some nausea but no vomiting.  No URI symptoms.  No chest pain or shortness of breath.  No diarrhea or abdominal pain.  No blood noted in the stool.  Patient is susceptible to frequent urinary tract infections and thought that this was the cause of her symptoms.  She was started on Cipro yesterday and has taken 2 doses of this.  Onset of symptoms acute.  Course is worsening.  Nothing makes symptoms better.     Past Medical History:  Diagnosis Date  . Anxiety   . Arthritis    hands  . 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 Deep venous thrombosis of calf ([place]) 2002  . Depression   . Diverticulosis   . History of blood transfusion 2009  . Hypertension   . Osteopenia   . PONV (postoperative nausea and vomiting)    nausea only yrs ago  . Tubular adenoma of colon 2011    Patient Active Problem List   Diagnosis Date Noted  . [place] 02/10/2014  . Cold  agglutinin disease 12/23/2013  . Other reasons for seeking consultation 09/09/2013  . Symptomatic cholelithiasis 09/02/2013  . Anemia 12/23/2008  . ANXIETY DEPRESSION 12/23/2008  . Unspecified essential hypertension 11/22/2007  . PAIN IN JOINT, SITE UNSPECIFIED 11/22/2007    Past Surgical History:  Procedure Laterality Date  . ABDOMINAL HYSTERECTOMY  age 40  . cataract surgery Bilateral 2011  . CHOLECYSTECTOMY N/A 09/20/2013   Procedure: LAPAROSCOPIC CHOLECYSTECTOMY WITH INTRAOPERATIVE CHOLANGIOGRAM;  Surgeon: Odis Hollingshead, MD;  Location: [facility];  Service: General;  Laterality: N/A;  . KNEE ARTHROSCOPY     left knee  . REPLACEMENT TOTAL KNEE Left 2006     OB History   No obstetric history on file.      Home Medications    Prior to Admission medications   Medication Sig Start Date End Date Taking? Authorizing Provider  amLODipine (NORVASC) 5 MG tablet amlodipine 5 mg tablet    [provider]  buPROPion (WELLBUTRIN XL) 300 MG 24 hr tablet Take 300 mg by mouth every morning.  08/15/13   [provider]  cephALEXin (KEFLEX) 500 MG capsule Take 1 capsule (500 mg total) by mouth 3 (three) times daily. Patient not taking: Reported on 01/17/2019 03/21/18   Tyson Dense T, DPM  ciprofloxacin (CIPRO) 500 MG tablet  01/24/18  [provider]  EPIPEN 2-PAK 0.3 MG/0.3ML SOAJ injection as needed. Reported on 02/23/2016 08/07/14   [provider]  folic acid (FOLVITE) 1 MG tablet Take 2 tablets (2 mg total) by mouth daily. Patient not taking: Reported on 01/17/2019 02/16/18   Volanda Napoleon, MD  hydrochlorothiazide (MICROZIDE) 12.5 MG capsule Take 12.5 mg by mouth daily. 04/24/18   [provider]  ibuprofen (ADVIL,MOTRIN) 600 MG tablet ibuprofen 600 mg tablet  Take 1 TABLET BY MOUTH EVERY 6 HOURS AS NEEDED    [provider]  losartan (COZAAR) 50 MG tablet losartan 50 mg tablet  Take 1 tablet by mouth daily (emergency fill)    [provider]  ondansetron (ZOFRAN) 4 MG tablet Take 1 tablet (4 mg total) by mouth every 8 (eight) hours as needed for nausea or vomiting. Patient not taking: Reported on 01/17/2019 03/21/18   Hyatt, Max T, DPM  SYNTHROID 75 MCG tablet Take 88 mcg by mouth daily.  09/02/16   [provider]  zolpidem (AMBIEN) 10 MG tablet Take 1 tablet (10 mg total) by mouth at bedtime. 11/24/17   Volanda Napoleon, MD    Family History Family History  Problem Relation Age of Onset  . Hypertension Mother   . Alcohol abuse Father   . Heart attack Father        died age 47  . Heart disease Brother   . Liver cancer Maternal Grandfather   . Heart attack Maternal Grandmother     Social History Social History   Tobacco Use  . Smoking status: Former Smoker    Packs/day: 0.50    Years: 20.00    Pack years: 10.00    Types: Cigarettes    Start date: 01/28/1964    Quit date: 09/02/2009    Years since quitting: 10.1  . Smokeless tobacco: Never Used  . Tobacco comment: quit 5 years ago  Substance Use Topics  . Alcohol use: Yes    Alcohol/week: 5.0 standard drinks    Types: 5 Glasses of wine per week    Comment: 2 glasses wine per day  . Drug use: No     Allergies   Bee venom   Review of Systems Review of Systems  Constitutional: Positive for fatigue. Negative for fever.  HENT: Negative for rhinorrhea and sore throat.   Eyes: Negative for redness.  Respiratory: Positive for cough. Negative for shortness of breath.   Cardiovascular: Negative for chest pain.  Gastrointestinal: Negative for abdominal pain, diarrhea, nausea and vomiting.  Genitourinary: Positive for hematuria. Negative for dysuria and vaginal bleeding.  Musculoskeletal: Negative for myalgias.  Skin: Negative for rash.  Neurological: Positive for light-headedness. Negative for headaches.  Hematological: Negative for adenopathy.     Physical Exam Updated Vital Signs BP (!) 144/61 (BP Location: Right Arm)   Pulse 81    Temp 98.8 F (37.1 C) (Oral)   Resp 20   Ht 5\' 5"  (1.651 m)   Wt 58.5 kg   SpO2 98%   BMI 21.47 kg/m   Physical Exam Vitals signs and nursing note reviewed.  Constitutional:      Appearance: She is well-developed.  HENT:     Head: Normocephalic and atraumatic.  Eyes:     General:        Right eye: No discharge.        Left eye: No discharge.     Comments: Mild conjunctival pallor  Neck:     Musculoskeletal: Normal range  of motion and neck supple.  Cardiovascular:     Rate and Rhythm: Normal rate and regular rhythm.     Heart sounds: Normal heart sounds.  Pulmonary:     Effort: Pulmonary effort is normal.     Breath sounds: Normal breath sounds.  Abdominal:     Palpations: Abdomen is soft.     Tenderness: There is abdominal tenderness. There is no guarding or rebound.     Comments: Mild generalized tenderness  Musculoskeletal:        General: No swelling.  Skin:    General: Skin is warm and dry.  Neurological:     General: No focal deficit present.     Mental Status: She is alert and oriented to person, place, and time.     Cranial Nerves: No cranial nerve deficit.     Comments: No focal extremity weakness.   Psychiatric:        Mood and Affect: Mood normal.      ED Treatments / Results  Labs (all labs ordered are listed, but only abnormal results are displayed) Labs Reviewed  CBC WITH DIFFERENTIAL/PLATELET - Abnormal; Notable for the following components:      Result Value   WBC 3.9 (*)    RBC 2.01 (*)    Hemoglobin 6.4 (*)    HCT 19.1 (*)    RDW 15.8 (*)    Lymphs Abs 0.6 (*)    All other components within normal limits  COMPREHENSIVE METABOLIC PANEL - Abnormal; Notable for the following components:   Sodium 133 (*)    Glucose, Bld 108 (*)    Calcium 8.4 (*)    Total Protein 5.8 (*)    Albumin 3.3 (*)    AST 64 (*)    ALT 64 (*)    Total Bilirubin 3.2 (*)    All other components within normal limits  SARS CORONAVIRUS 2 (TAT 6-24 HRS)   URINALYSIS, ROUTINE W REFLEX MICROSCOPIC  LACTATE DEHYDROGENASE  RETICULOCYTES  FERRITIN  IRON AND TIBC  CBC WITH DIFFERENTIAL/PLATELET  RETICULOCYTES  COMPREHENSIVE METABOLIC PANEL  LACTATE DEHYDROGENASE  CBG MONITORING, ED  TYPE AND SCREEN  PREPARE RBC (CROSSMATCH)  PREPARE RBC (CROSSMATCH)    EKG EKG Interpretation  Date/Time:  Monday October 07 2019 15:34:18 EDT Ventricular Rate:  85 PR Interval:    QRS Duration: 91 QT Interval:  367 QTC Calculation: 437 R Axis:   -17 Text Interpretation: Normal sinus rhythm Probable left atrial enlargement Borderline left axis deviation Anterior infarct, old similar to 2015 Confirmed by Sherwood Gambler [phone]) on 10/07/2019 4:57:07 PM   Radiology Dg Chest Portable 1 View  Result Date: 10/07/2019 CLINICAL DATA:  Cough, anemia, weakness EXAM: PORTABLE CHEST 1 VIEW COMPARISON:  Radiograph 11/16/2007 FINDINGS: No consolidation, features of edema, pneumothorax, or effusion. Pulmonary vascularity is normally distributed. The cardiomediastinal contours are unremarkable. No acute osseous or soft tissue abnormality. Cardiac monitoring leads overlie the chest. Vascular calcium in the left neck. IMPRESSION: No acute cardiopulmonary abnormality. Electronically Signed   By: Lovena Le M.D.   On: 10/07/2019 16:30    Procedures Procedures (including critical care time)  Medications Ordered in ED Medications  sodium chloride flush (NS) 0.9 % injection 3 mL (has no administration in time range)  0.9 %  sodium chloride infusion (Manually program via Guardrails IV Fluids) (has no administration in time range)  sodium chloride 0.9 % bolus 500 mL (500 mLs Intravenous New Bag/Given 10/07/19 1627)     Initial Impression / Assessment  and Plan / ED Course  I have reviewed the triage vital signs and the nursing notes.  Pertinent labs & imaging results that were available during my care of the patient were reviewed by me and considered in my medical  decision making (see chart for details).        Patient seen and examined. Work-up initiated. Fluids ordered. Reviewed labs from PCP. Asked RN to ensure patient stays warm with blankets, warmed IV fluids if possible.   Vital signs reviewed and are as follows: BP (!) 144/61 (BP Location: Right Arm)   Pulse 81   Temp 98.8 F (37.1 C) (Oral)   Resp 20   Ht 5\' 5"  (1.651 m)   Wt 58.5 kg   SpO2 98%   BMI 21.47 kg/m   4:49 PM H&H 6.4/19., normocytic. Tbili 3.2. Plt 274.  Blood transfusion ordered. Will discuss treatment plan with Dr. Marin Olp.   5:51 PM I spoke with Dr. Marin Olp.  Request additional labs.  Agrees with blood transfusion.  Requested blood be warmed during transfusion.  She may need other therapy for the hemolysis and requests admission to [facility] if possible.  Patient and family updated and aware of plan.  I spoke with [facility] who will see patient.   CRITICAL CARE Performed by: Carlisle Cater PA-C Total critical care time: 45 minutes Critical care time was exclusive of separately billable procedures and treating other patients. Critical care was necessary to treat or prevent imminent or life-threatening deterioration. Critical care was time spent personally by me on the following activities: development of treatment plan with patient and/or surrogate as well as nursing, discussions with consultants, evaluation of patient's response to treatment, examination of patient, obtaining history from patient or surrogate, ordering and performing treatments and interventions, ordering and review of laboratory studies, ordering and review of radiographic studies, pulse oximetry and re-evaluation of patient's condition.   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  Admit for treatment.   ED Discharge Orders    None       Carlisle Cater, [place] 10/07/19  1753    Sherwood Gambler, MD 10/07/19 1900

## 2019-10-07 NOTE — ED Triage Notes (Signed)
Pt presents via [facility] with weakness since Friday night. Blood in her urine starting yesterday. Dizziness with EMS while transferring from Dr. Manya Silvas bed to EMS stretcher. Pt started on Cipro yesterday. Sent here from MD's office

## 2019-10-07 NOTE — ED Notes (Signed)
Gwyndolyn Saxon, pt's spouse updated regarding pt's covid status

## 2019-10-07 NOTE — ED Notes (Signed)
Gwyndolyn Saxon spouse updated

## 2019-10-07 NOTE — ED Notes (Signed)
Blood bank called they need 2 pink tops to be able to complete type and screen

## 2019-10-07 NOTE — H&P (Signed)
History and Physical  Christine [name] [id number] DOB: January 06, 1946 DOA: 10/07/2019  PCP: Marton Redwood, MD Patient coming from:   I have personally briefly reviewed patient's old medical records in [place]   Chief Complaint: Weakness  HPI: Christine [name] is a 73 y.o. female Cold agglutinin, lymphoproliferative disorder,  DVT, Depression, status post 1 week of Rituxan completed in 2015, followed by Dr. Marin Olp she presents complaining of worsening weakness, low energy, lightheadedness on sitting up.  She went to see her primary care today and was found to have low hemoglobin, orthostatic positive at the PCP office.  Patient was transfer to the ED by ambulance from PCP office. She denies melena, hematemesis or bright blood in the stool.  Her urine has been dark.  She denies abdominal pain, chest pain, shortness of breath.  Since she has been in the ED she reports finding herself clearing her throat from secretions.  Patient relates that the weakness started on Friday afternoon.  She spend  the weekend lying down in bed Saturday and Sunday.  She had a fever on Saturday at 101. She reports mild headache. Of note patient took two dose of cipro thinking her symptoms was related to UTI.   Evaluation in the ED; sodium 133, potassium 3.8, glucose 108, BUN 12, creatinine 0.6, calcium 8.4, albumin 3.3, AST 64, ALT 64, bilirubin 3.2, hemoglobin 6.4, white blood cell 3.9, platelets 224.  Covid test pending Chest x-ray: No acute cardiopulmonary disease. Blood transfusion has been ordered, patient is started on IV fluids.  Case discussed with Dr. Marin Olp who is recommending for patient to be admitted to [facility].   Review of Systems: All systems reviewed and apart from history of presenting illness, are negative.  Past Medical History:  Diagnosis Date  . Anxiety   . Arthritis    hands  . 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 Deep venous thrombosis of calf ([place]) 2002  .  Depression   . Diverticulosis   . History of blood transfusion 2009  . Hypertension   . Osteopenia   . PONV (postoperative nausea and vomiting)    nausea only yrs ago  . Tubular adenoma of colon 2011   Past Surgical History:  Procedure Laterality Date  . ABDOMINAL HYSTERECTOMY  age 44  . cataract surgery Bilateral 2011  . CHOLECYSTECTOMY N/A 09/20/2013   Procedure: LAPAROSCOPIC CHOLECYSTECTOMY WITH INTRAOPERATIVE CHOLANGIOGRAM;  Surgeon: Odis Hollingshead, MD;  Location: [facility];  Service: General;  Laterality: N/A;  . KNEE ARTHROSCOPY     left knee  . REPLACEMENT TOTAL KNEE Left 2006   Social History:  reports that she quit smoking about 10 years ago. Her smoking use included cigarettes. She started smoking about 55 years ago. She has a 10.00 pack-year smoking history. She has never used smokeless tobacco. She reports current alcohol use of about 5.0 standard drinks of alcohol per week. She reports that she does not use drugs.   Allergies  Allergen Reactions  . Bee Venom Anaphylaxis and Shortness Of Breath    Family History  Problem Relation Age of Onset  . Hypertension Mother   . Alcohol abuse Father   . Heart attack Father        died age 35  . Heart disease Brother   . Liver cancer Maternal Grandfather   . Heart attack Maternal Grandmother     Prior to Admission medications   Medication Sig Start Date End Date Taking? Authorizing Provider  amLODipine (NORVASC) 5 MG tablet amlodipine 5 mg tablet    [provider]  buPROPion (WELLBUTRIN XL) 300 MG 24 hr tablet Take 300 mg by mouth every morning.  08/15/13   [provider]  cephALEXin (KEFLEX) 500 MG capsule Take 1 capsule (500 mg total) by mouth 3 (three) times daily. Patient not taking: Reported on 01/17/2019 03/21/18   Tyson Dense T, DPM  ciprofloxacin (CIPRO) 500 MG tablet  01/24/18   [provider]  EPIPEN 2-PAK 0.3 MG/0.3ML SOAJ injection as needed. Reported on 02/23/2016 08/07/14   [provider]  folic acid (FOLVITE) 1 MG tablet Take 2 tablets (2 mg total) by mouth daily. Patient not taking: Reported on 01/17/2019 02/16/18   Volanda Napoleon, MD  hydrochlorothiazide (MICROZIDE) 12.5 MG capsule Take 12.5 mg by mouth daily. 04/24/18   [provider]  ibuprofen (ADVIL,MOTRIN) 600 MG tablet ibuprofen 600 mg tablet  Take 1 TABLET BY MOUTH EVERY 6 HOURS AS NEEDED    [provider]  losartan (COZAAR) 50 MG tablet losartan 50 mg tablet  Take 1 tablet by mouth daily (emergency fill)    [provider]  ondansetron (ZOFRAN) 4 MG tablet Take 1 tablet (4 mg total) by mouth every 8 (eight) hours as needed for nausea or vomiting. Patient not taking: Reported on 01/17/2019 03/21/18   Hyatt, Max T, DPM  SYNTHROID 75 MCG tablet Take 88 mcg by mouth daily.  09/02/16   [provider]  zolpidem (AMBIEN) 10 MG tablet Take 1 tablet (10 mg total) by mouth at bedtime. 11/24/17   Volanda Napoleon, MD   Physical Exam: Vitals:   10/07/19 1534 10/07/19 1535  BP: (!) 144/61   Pulse: 81   Resp: 20   Temp: 98.8 F (37.1 C)   TempSrc: Oral   SpO2: 98%   Weight:  58.5 kg  Height:  5\' 5"  (1.651 m)     General exam: Moderately built and nourished patient, lying comfortably supine on the gurney in no obvious distress.  Pale, jaundiced  Head, eyes and ENT: Nontraumatic and normocephalic. Pupils equally reacting to light and accommodation. Oral mucosa moist.  Neck: Supple. No JVD, carotid bruit or thyromegaly.  Lymphatics: No lymphadenopathy.  Respiratory system: Clear to auscultation. No increased work of breathing.  Cardiovascular system: S1 and S2 heard, RRR. No JVD, murmurs, gallops, clicks or pedal edema.  Gastrointestinal system: Abdomen is nondistended, soft and nontender. Normal bowel sounds heard. No organomegaly or masses appreciated.  Central nervous system: Alert and oriented.  Generalized weak  Extremities: Symmetric 5 x 5 power. Peripheral  pulses symmetrically felt.   Skin: No rashes or acute findings.  Musculoskeletal system: Negative exam.  Psychiatry: Pleasant and cooperative.   Labs on Admission:  Basic Metabolic Panel: Recent Labs  Lab 10/07/19 1539  NA 133*  K 3.8  CL 102  CO2 23  GLUCOSE 108*  BUN 12  CREATININE 0.65  CALCIUM 8.4*   Liver Function Tests: Recent Labs  Lab 10/07/19 1539  AST 64*  ALT 64*  ALKPHOS 107  BILITOT 3.2*  PROT 5.8*  ALBUMIN 3.3*   No results for input(s): LIPASE, AMYLASE in the last 168 hours. No results for input(s): AMMONIA in the last 168 hours. CBC: Recent Labs  Lab 10/07/19 1539  WBC 3.9*  NEUTROABS 3.0  HGB 6.4*  HCT 19.1*  MCV 95.0  PLT 224   Cardiac Enzymes: No results for input(s): CKTOTAL, CKMB, CKMBINDEX, TROPONINI in the last  168 hours.  BNP (last 3 results) No results for input(s): PROBNP in the last 8760 hours. CBG: No results for input(s): GLUCAP in the last 168 hours.  Radiological Exams on Admission: Dg Chest Portable 1 View  Result Date: 10/07/2019 CLINICAL DATA:  Cough, anemia, weakness EXAM: PORTABLE CHEST 1 VIEW COMPARISON:  Radiograph 11/16/2007 FINDINGS: No consolidation, features of edema, pneumothorax, or effusion. Pulmonary vascularity is normally distributed. The cardiomediastinal contours are unremarkable. No acute osseous or soft tissue abnormality. Cardiac monitoring leads overlie the chest. Vascular calcium in the left neck. IMPRESSION: No acute cardiopulmonary abnormality. Electronically Signed   By: Lovena Le M.D.   On: 10/07/2019 16:30    EKG: Independently reviewed.  Sinus rhythm  Assessment/Plan Active Problems:   Anemia   ANXIETY DEPRESSION   Essential hypertension   Cold agglutinin disease   Transaminasemia   1-Symptomatic anemia; Hemolysis; could be related to her history of cold agglutinin diseases, also has history of lymphoproliferative disorder.  -Two units PRBC ordered.  -Dr Marin Olp consulted.   -Admit to [facility] in case required treatment.  -could be related to exacerbation by infection; check respiratory panel, UA, Urine culture.  -check Bilirubin fraction in am.  -CT chest abdomen ordered as recommended by Dr Marin Olp. -IV fluids.   2-? Fever;  Patient reports fever at home. Afebrile here. Chest x ray negative.  Check UA, urine culture, respiratory viral panel. Droplet and contact precaution.   3-HTN; hold HCTZ, due to orthostatic symptoms.   4-Transaminases;  Check viral hepatitis panel.  CT abdomen pelvis ordered.   5-Hypothyroidism; continue with Synthroid.    6-Covid test pending.  I use surgical mask, face shield.   DVT Prophylaxis: SCD Code Status: Full code Family Communication: care discussed with husband who was at bedside.  Disposition Plan: admit to inpatient anticipate more than 2 midnight, patient admitted with hemolysis. Needs further treatment and evaluation.   Time spent: 75 minutes  Elmarie Shiley MD [facility]   10/07/2019, 6:37 PM

## 2019-10-07 NOTE — Consult Note (Addendum)
Referral MD  Reason for Referral: Recurrent cold agglutinin hemolytic anemia  Chief Complaint  Patient presents with  . Dizziness  . Weakness  : I just got very weak.  HPI: Ms. Pousson is well-known to me.  I am not seeing her now for 3 years.  She is a very nice 73 year old white female.  She has a history of cold agglutinin hemolytic anemia.  She likely has underlying low-grade lymphoproliferative process.  We actually saw her back in October 2017.  She had received 4 doses of Rituxan which was completed in April 2015.  We last saw her, her cold agglutinin titer was 1: 4000.  She has been on folic acid.  She has not been taking this for quite a while.  She has been feeling okay until recently.  She then started to feel weaker.  Last week, she felt that she may have had a urinary tract infection.  She called her family doctor.  He put her on I think ciprofloxacin.  She did not improve.  She subsequently went to be seen.  I think she had a CBC done as part of the work-up.  This showed hemoglobin of 7.2.  She was told to go to the emergency room.  In the ER today, her white cell count 3.9.  Hemoglobin 6.4.  Platelet count 224,000.  MCV was 95.  Reticulocyte count corrected was about 2.1.  Her chemistry studies show a bilirubin of 3.2.  Her SGPT is 64 and SGOT 64.  Alkaline phosphatase 107.  Her creatinine is 0.65.  Her potassium is 3.8.  Her LDH is 273.  She does not note any weight loss.  She has had no palpable lymph nodes.  She has had no fever.  She does not recall any type of recent infection outside of the possibility of a urinary tract infection.  Again, since we last saw her, she really has been feeling quite well.  Her husband can attest to that.  She is had no rashes.  There is no leg swelling.  There is no chest wall pain.  She had a chest x-ray done in the emergency room.  This was unremarkable.  She is still working.  She works at a [employer] in  [place].  She does not smoke.  She has an occasional alcoholic beverage.  Overall, I would say her performance status is ECOG 1.    Past Medical History:  Diagnosis Date  . Anxiety   . Arthritis    hands  . 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 Deep venous thrombosis of calf ([place]) 2002  . Depression   . Diverticulosis   . History of blood transfusion 2009  . Hypertension   . Osteopenia   . PONV (postoperative nausea and vomiting)    nausea only yrs ago  . Tubular adenoma of colon 2011  :  Past Surgical History:  Procedure Laterality Date  . ABDOMINAL HYSTERECTOMY  age 89  . cataract surgery Bilateral 2011  . CHOLECYSTECTOMY N/A 09/20/2013   Procedure: LAPAROSCOPIC CHOLECYSTECTOMY WITH INTRAOPERATIVE CHOLANGIOGRAM;  Surgeon: Odis Hollingshead, MD;  Location: [facility];  Service: General;  Laterality: N/A;  . KNEE ARTHROSCOPY     left knee  . REPLACEMENT TOTAL KNEE Left 2006  :   Current Facility-Administered Medications:  .  0.9 %  sodium chloride infusion (Manually program via Guardrails IV Fluids), , Intravenous, Once, Regalado, Belkys A, MD .  0.9 %  sodium chloride infusion, , Intravenous, Continuous, Regalado,  Belkys A, MD .  acetaminophen (TYLENOL) tablet 650 mg, 650 mg, Oral, Q6H PRN, Regalado, Belkys A, MD .  bisacodyl (DULCOLAX) EC tablet 5 mg, 5 mg, Oral, Daily PRN, Regalado, Belkys A, MD .  Derrill Memo ON 10/08/2019] buPROPion (WELLBUTRIN XL) 24 hr tablet 300 mg, 300 mg, Oral, q morning - 10a, Regalado, Belkys A, MD .  docusate sodium (COLACE) capsule 100 mg, 100 mg, Oral, BID, Regalado, Belkys A, MD .  folic acid (FOLVITE) tablet 2 mg, 2 mg, Oral, Daily, Regalado, Belkys A, MD .  Derrill Memo ON 10/08/2019] levothyroxine (SYNTHROID) tablet 88 mcg, 88 mcg, Oral, QAC breakfast, Regalado, Belkys A, MD .  ondansetron (ZOFRAN) tablet 4 mg, 4 mg, Oral, Q6H PRN **OR** ondansetron (ZOFRAN) injection 4 mg, 4 mg, Intravenous, Q6H PRN, Regalado, Belkys A, MD .  senna (SENOKOT)  tablet 8.6 mg, 1 tablet, Oral, BID, Regalado, Belkys A, MD .  sodium chloride flush (NS) 0.9 % injection 3 mL, 3 mL, Intravenous, Once, Regalado, Belkys A, MD .  traMADol (ULTRAM) tablet 50 mg, 50 mg, Oral, Q6H PRN, Regalado, Belkys A, MD .  zolpidem (AMBIEN) tablet 10 mg, 10 mg, Oral, QHS PRN, Regalado, Belkys A, MD  Current Outpatient Medications:  .  acetaminophen (TYLENOL) 500 MG tablet, Take 500-1,000 mg by mouth every 6 (six) hours as needed for mild pain or headache., Disp: , Rfl:  .  amoxicillin (AMOXIL) 500 MG capsule, Take 2,000 mg by mouth See admin instructions. Take 2,000 mg by mouth one hour before dental appointments, Disp: , Rfl:  .  ciprofloxacin (CIPRO) 500 MG tablet, Take 500 mg by mouth 2 (two) times daily. FOR 7 DAYS, Disp: , Rfl:  .  hydrochlorothiazide (MICROZIDE) 12.5 MG capsule, Take 12.5 mg by mouth daily., Disp: , Rfl: 11 .  SYNTHROID 88 MCG tablet, Take 88 mcg by mouth daily before breakfast. , Disp: , Rfl:  .  buPROPion (WELLBUTRIN XL) 300 MG 24 hr tablet, Take 300 mg by mouth every morning. , Disp: , Rfl:  .  cephALEXin (KEFLEX) 500 MG capsule, Take 1 capsule (500 mg total) by mouth 3 (three) times daily. (Patient not taking: Reported on 10/07/2019), Disp: 30 capsule, Rfl: 0 .  EPIPEN 2-PAK 0.3 MG/0.3ML SOAJ injection, Inject 0.3 mg into the muscle once as needed for anaphylaxis. , Disp: , Rfl: 6 .  folic acid (FOLVITE) 1 MG tablet, Take 2 tablets (2 mg total) by mouth daily., Disp: 180 tablet, Rfl: 3 .  ibuprofen (ADVIL,MOTRIN) 600 MG tablet, Take 600 mg by mouth every 6 (six) hours as needed for headache, mild pain or moderate pain. , Disp: , Rfl:  .  losartan (COZAAR) 50 MG tablet, Take 50 mg by mouth daily. , Disp: , Rfl:  .  ondansetron (ZOFRAN) 4 MG tablet, Take 1 tablet (4 mg total) by mouth every 8 (eight) hours as needed for nausea or vomiting., Disp: 20 tablet, Rfl: 0 .  zolpidem (AMBIEN) 10 MG tablet, Take 1 tablet (10 mg total) by mouth at bedtime., Disp:  30 tablet, Rfl: 3:  . sodium chloride   Intravenous Once  . [START ON 10/08/2019] buPROPion  300 mg Oral q morning - 10a  . docusate sodium  100 mg Oral BID  . folic acid  2 mg Oral Daily  . [START ON 10/08/2019] levothyroxine  88 mcg Oral QAC breakfast  . senna  1 tablet Oral BID  . sodium chloride flush  3 mL Intravenous Once  :  Allergies  Allergen  Reactions  . Bee Venom Anaphylaxis and Shortness Of Breath  :  Family History  Problem Relation Age of Onset  . Hypertension Mother   . Alcohol abuse Father   . Heart attack Father        died age 52  . Heart disease Brother   . Liver cancer Maternal Grandfather   . Heart attack Maternal Grandmother   :  Social History   Socioeconomic History  . Marital status: Married    Spouse name: Not on file  . Number of children: 3  . Years of education: Not on file  . Highest education level: Not on file  Occupational History  . Occupation: Self employeed   Social Needs  . Financial resource strain: Not on file  . Food insecurity    Worry: Not on file    Inability: Not on file  . Transportation needs    Medical: Not on file    Non-medical: Not on file  Tobacco Use  . Smoking status: Former Smoker    Packs/day: 0.50    Years: 20.00    Pack years: 10.00    Types: Cigarettes    Start date: 01/28/1964    Quit date: 09/02/2009    Years since quitting: 10.1  . Smokeless tobacco: Never Used  . Tobacco comment: quit 5 years ago  Substance and Sexual Activity  . Alcohol use: Yes    Alcohol/week: 5.0 standard drinks    Types: 5 Glasses of wine per week    Comment: 2 glasses wine per day  . Drug use: No  . Sexual activity: Not on file  Lifestyle  . Physical activity    Days per week: Not on file    Minutes per session: Not on file  . Stress: Not on file  Relationships  . Social Herbalist on phone: Not on file    Gets together: Not on file    Attends religious service: Not on file    Active member of club or  organization: Not on file    Attends meetings of clubs or organizations: Not on file    Relationship status: Not on file  . Intimate partner violence    Fear of current or ex partner: Not on file    Emotionally abused: Not on file    Physically abused: Not on file    Forced sexual activity: Not on file  Other Topics Concern  . Not on file  Social History Narrative  . Not on file  :  Review of Systems  Constitutional: Positive for malaise/fatigue.  HENT: Negative.   Eyes: Positive for blurred vision.  Respiratory: Positive for cough and shortness of breath.   Cardiovascular: Positive for palpitations.  Gastrointestinal: Negative.   Genitourinary: Positive for dysuria and frequency.  Musculoskeletal: Positive for joint pain and myalgias.  Skin: Negative.   Neurological: Positive for weakness.  Endo/Heme/Allergies: Negative.   Psychiatric/Behavioral: Negative.      Exam: Well-developed and well-nourished white female in no obvious distress.  Vital signs showed a temperature of 98.8.  Pulse is 96.  Respiratory rate 16 blood pressure is 144/61.  Head neck exam shows some questionable scleral icterus.  She has no oral lesions.  Extraocular muscles are intact.  There is no adenopathy in the neck.  Thyroid is nonpalpable.  Lungs are clear bilaterally.  Cardiac exam regular rate and rhythm with no murmurs, rubs or bruits.  Abdomen is soft.  Bowel sounds are present.  There  is no fluid wave.  There is no palpable liver or spleen tip.  Extremities shows no clubbing, cyanosis or edema.  She has decent range of motion of her joints.  There is no muscle atrophy in upper or lower extremities.  Skin exam shows no rashes, ecchymoses or petechia.  Neurological exam is nonfocal.   Patient Vitals for the past 24 hrs:  BP Temp Temp src Pulse Resp SpO2 Height Weight  10/07/19 1535 - - - - - - _0  (1.651 m) 129 lb (58.5 kg)  10/07/19 1534 (!) 144/61 98.8 F (37.1 C) Oral 81 20 98 % - -     Recent  Labs    10/07/19 1539  WBC 3.9*  HGB 6.4*  HCT 19.1*  PLT 224   Recent Labs    10/07/19 1539  NA 133*  K 3.8  CL 102  CO2 23  GLUCOSE 108*  BUN 12  CREATININE 0.65  CALCIUM 8.4*    Blood smear review: Pending  Pathology: None    Assessment and Plan: Ms. Dimauro is a very nice 73 year old white female.  Again I suspect that she has recurrence of her cold agglutinin hemolytic anemia.  I have to worry that she has a recurrence of a lymphoproliferative process.  We may have to do a bone marrow biopsy on her in the hospital to prove that a lymphoproliferative process has recurred.  I will try to see if flow cytometry on her peripheral blood can give [place] some information with respect to a monoclonal population of lymphocytes.  She deftly needs to be transfused.  She needs to be given blood through a blood warmer.  I suspect were going to have to embark upon treatment again.  I would give her Rituxan probably with bendamustine.  I think that since she has recurrence, the addition of bendamustine.  To the Rituxan would be reasonable.  I would like to get a CT scan of her body to make sure we do not see anything that looks like lymphoma that is obviously recurred.  She needs to be back on folic acid.  I have her on 2 mg daily of folic acid.  I told her there is no way she is going home anytime soon.  We really need to get her hemoglobin up to about 11.  I do not think that she is bleeding.  There is no evidence of bleeding.  I am a little surprised that her MCV is not higher.  I would check iron studies on her.  We will follow her along.  I suspect she will be moved over to [facility] so that we can institute systemic therapy on her.  I spent about an hour with she and her husband.  I have not seen her for 3 years.  She still looks good.  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  I know that she will get  incredible care from all the staff at [facility] as well as the care she is getting currently from the ER staff at [facility], MD  Michaelyn Barter 6:9-10

## 2019-10-08 DIAGNOSIS — F341 Dysthymic disorder: Secondary | ICD-10-CM

## 2019-10-08 DIAGNOSIS — U071 COVID-19: Principal | ICD-10-CM

## 2019-10-08 DIAGNOSIS — R7401 Elevation of levels of liver transaminase levels: Secondary | ICD-10-CM

## 2019-10-08 DIAGNOSIS — J1289 Other viral pneumonia: Secondary | ICD-10-CM

## 2019-10-08 DIAGNOSIS — I10 Essential (primary) hypertension: Secondary | ICD-10-CM

## 2019-10-08 DIAGNOSIS — R7989 Other specified abnormal findings of blood chemistry: Secondary | ICD-10-CM

## 2019-10-08 LAB — COMPREHENSIVE METABOLIC PANEL
ALT: 56 U/L — ABNORMAL HIGH (ref 0–44)
AST: 60 U/L — ABNORMAL HIGH (ref 15–41)
Albumin: 3.1 g/dL — ABNORMAL LOW (ref 3.5–5.0)
Alkaline Phosphatase: 102 U/L (ref 38–126)
Anion gap: 7 (ref 5–15)
BUN: 11 mg/dL (ref 8–23)
CO2: 22 mmol/L (ref 22–32)
Calcium: 7.7 mg/dL — ABNORMAL LOW (ref 8.9–10.3)
Chloride: 106 mmol/L (ref 98–111)
Creatinine, Ser: 0.69 mg/dL (ref 0.44–1.00)
GFR calc Af Amer: >60 mL/min (ref >60)
GFR calc non Af Amer: >60 mL/min (ref >60)
Glucose, Bld: 97 mg/dL (ref 70–99)
Potassium: 3.6 mmol/L (ref 3.5–5.1)
Sodium: 135 mmol/L (ref 135–145)
Total Bilirubin: 3.7 mg/dL — ABNORMAL HIGH (ref 0.3–1.2)
Total Protein: 5.6 g/dL — ABNORMAL LOW (ref 6.5–8.1)

## 2019-10-08 LAB — CBC WITH DIFFERENTIAL/PLATELET
Abs Immature Granulocytes: 0.03 10*3/uL (ref 0.00–0.07)
Basophils Absolute: 0 10*3/uL (ref 0.0–0.1)
Basophils Relative: 0 %
Eosinophils Absolute: 0 10*3/uL (ref 0.0–0.5)
Eosinophils Relative: 0 %
HCT: 24.7 % — ABNORMAL LOW (ref 36.0–46.0)
Hemoglobin: 8.1 g/dL — ABNORMAL LOW (ref 12.0–15.0)
Immature Granulocytes: 1 %
Lymphocytes Relative: 20 %
Lymphs Abs: 0.7 10*3/uL (ref 0.7–4.0)
MCH: 31 pg (ref 26.0–34.0)
MCHC: 32.8 g/dL (ref 30.0–36.0)
MCV: 94.6 fL (ref 80.0–100.0)
Monocytes Absolute: 0.2 10*3/uL (ref 0.1–1.0)
Monocytes Relative: 5 %
Neutro Abs: 2.4 10*3/uL (ref 1.7–7.7)
Neutrophils Relative %: 74 %
Platelets: 190 10*3/uL (ref 150–400)
RBC: 2.61 MIL/uL — ABNORMAL LOW (ref 3.87–5.11)
RDW: 15.8 % — ABNORMAL HIGH (ref 11.5–15.5)
WBC: 3.3 10*3/uL — ABNORMAL LOW (ref 4.0–10.5)
nRBC: 0 % (ref 0.0–0.2)

## 2019-10-08 LAB — HEPATITIS PANEL, ACUTE
HCV Ab: NONREACTIVE
Hep A IgM: NONREACTIVE
Hep B C IgM: NONREACTIVE
Hepatitis B Surface Ag: NONREACTIVE

## 2019-10-08 LAB — ABO/RH: ABO/RH(D): AB POS

## 2019-10-08 LAB — URINALYSIS, ROUTINE W REFLEX MICROSCOPIC
Bilirubin Urine: NEGATIVE
Glucose, UA: NEGATIVE mg/dL
Hgb urine dipstick: NEGATIVE
Ketones, ur: 15 mg/dL — AB
Leukocytes,Ua: NEGATIVE
Nitrite: NEGATIVE
Protein, ur: NEGATIVE mg/dL
Specific Gravity, Urine: 1.01 (ref 1.005–1.030)
pH: 7 (ref 5.0–8.0)

## 2019-10-08 LAB — IRON AND TIBC
Iron: 59 ug/dL (ref 28–170)
Saturation Ratios: 30 % (ref 10.4–31.8)
TIBC: 195 ug/dL — ABNORMAL LOW (ref 250–450)
UIBC: 136 ug/dL

## 2019-10-08 LAB — RETICULOCYTES
Immature Retic Fract: 21.2 % — ABNORMAL HIGH (ref 2.3–15.9)
RBC.: 2.61 MIL/uL — ABNORMAL LOW (ref 3.87–5.11)
Retic Count, Absolute: 104.1 10*3/uL (ref 19.0–186.0)
Retic Ct Pct: 4 % — ABNORMAL HIGH (ref 0.4–3.1)

## 2019-10-08 LAB — CBG MONITORING, ED: Glucose-Capillary: 137 mg/dL — ABNORMAL HIGH (ref 70–99)

## 2019-10-08 LAB — FERRITIN: Ferritin: 939 ng/mL — ABNORMAL HIGH (ref 11–307)

## 2019-10-08 LAB — LACTATE DEHYDROGENASE: LDH: 369 U/L — ABNORMAL HIGH (ref 98–192)

## 2019-10-08 LAB — FLOW CYTOMETRY REQUEST - FLUID (INPATIENT)

## 2019-10-08 MED ORDER — SODIUM CHLORIDE 0.9 % IV SOLN
200.0000 mg | Freq: Once | INTRAVENOUS | Status: AC
  Administered 2019-10-08: 200 mg via INTRAVENOUS
  Filled 2019-10-08: qty 40

## 2019-10-08 MED ORDER — MORPHINE SULFATE (PF) 2 MG/ML IV SOLN: 2.0000 mg | INTRAVENOUS | Status: DC | PRN

## 2019-10-08 MED ORDER — METHYLPREDNISOLONE SODIUM SUCC 125 MG IJ SOLR
60.0000 mg | Freq: Two times a day (BID) | INTRAMUSCULAR | Status: DC
  Administered 2019-10-08 – 2019-10-11 (×7): 60 mg via INTRAVENOUS
  Filled 2019-10-08 (×7): qty 2

## 2019-10-08 MED ORDER — SODIUM CHLORIDE 0.9 % IV SOLN
100.0000 mg | INTRAVENOUS | Status: AC
  Administered 2019-10-09 – 2019-10-12 (×4): 100 mg via INTRAVENOUS
  Filled 2019-10-08 (×6): qty 20

## 2019-10-08 MED ORDER — ZOLPIDEM TARTRATE 5 MG PO TABS
5.0000 mg | ORAL_TABLET | Freq: Every evening | ORAL | Status: DC | PRN
  Administered 2019-10-09 – 2019-10-11 (×4): 5 mg via ORAL
  Filled 2019-10-08 (×4): qty 1

## 2019-10-08 NOTE — ED Notes (Signed)
Assisted pt with bedside commode. 

## 2019-10-08 NOTE — Progress Notes (Signed)
Patient ID: Christine [name], female   DOB: 1946/09/15, 73 y.o.   MRN: [id number]  PROGRESS NOTE    Christine [name]  [id number] DOB: 11/26/1946 DOA: 10/07/2019 PCP: Marton Redwood, MD   Brief Narrative:  73 year old female with history of cold agglutinin hemolytic anemia, DVT, depression, status post 1 week of Rituxan completed in 2015, followed by Dr. Marin Olp presented on 10/07/2019 with worsening weakness, low energy and lightheadedness.  She went to her PCP's office on 10/07/2019 and was found to have low hemoglobin along with orthostatic hypotension and subsequently referred to ED.  She was found to have hemoglobin of 6.4.  Chest x-ray was negative for acute cardiopulmonary disease.  She was transfused 2 unit packed red cells.  Oncology/Dr. Marin Olp was consulted who recommended transfer to [facility].  CT of the chest abdomen and pelvis was ordered.  Assessment & Plan:   Symptomatic anemia, most likely hemolytic in a patient with history of cold agglutinin hemolytic anemia -Presented with hemoglobin of 6.4.  Status post transfusion of 2 unit packed red cells. -Hemoglobin 8.1 this morning.  Monitor H&H. -Dr. Marin Olp following.  Transfer to [facility] once bed available. -CT of the chest abdomen and pelvis showed no evidence of recurrent lymphoma in the chest, abdomen or pelvis but showed scattered groundglass airspace opacities throughout the upper lobes bilaterally along with the possibility of viral pneumonia  COVID-19 pneumonia -Patient had fever at home.  COVID-19 tested positive.  CT of the chest showed findings as above. -Currently on room air.  No worsening cough.  Will start IV Solu-Medrol and remdesivir. -Airborne and contact isolation  Hypertension  -Blood pressure on the higher side.  Antihypertensives will remain on hold for today for as patient was orthostatic yesterday.  Mild transaminitis--follow LFTs.  LFTs have not trended up.  Ectatic ascending  thoracic aorta measuring up to 4.1 cm diameter -only minimally increased since 2015.  Recommend annual imaging followed by CTA or MRA  Possible hemodynamically significant stenosis involving the origin of the left subclavian artery -Might need outpatient vascular surgery follow-up.  Spoke to Dr. Scot Dock on phone on 10/08/2019 who recommended outpatient vascular surgery follow-up.  Hypothyroidism -Continue Synthroid  DVT prophylaxis: SCDs.  Avoid heparin/Lovenox because of anemia Code Status: Full Family Communication: Spoke to patient at bedside. Disposition Plan: We will remain inpatient for at least 5 days to complete remdesivir therapy.  Transfer to [facility] long hospital  Consultants: Oncology  Procedures: None  Antimicrobials: None   Subjective: Patient seen and examined at bedside.  She complains that her back hurts.  Denies any worsening cough, shortness of breath.  No overnight fever or vomiting.  Objective: Vitals:   10/08/19 0530 10/08/19 0630 10/08/19 0700 10/08/19 0800  BP: (!) 144/91 (!) 141/81 (!) 161/79 (!) 149/78  Pulse: 71 71 70 73  Resp: 18 20 (!) 22 15  Temp:      TempSrc:      SpO2: 97% 98% 96% 97%  Weight:      Height:        Intake/Output Summary (Last 24 hours) at 10/08/2019 1028 Last data filed at 10/08/2019 0434 Gross per 24 hour  Intake 1760 ml  Output 600 ml  Net 1160 ml   Filed Weights   10/07/19 1535  Weight: 58.5 kg    Examination:  General exam: Appears calm and comfortable. Respiratory system: Bilateral decreased breath sounds at bases with some scattered crackles Cardiovascular system: S1 & S2 heard, Rate controlled Gastrointestinal  system: Abdomen is nondistended, soft and nontender. Normal bowel sounds heard. Extremities: No cyanosis, clubbing, edema  Central nervous system: Alert and oriented. No focal neurological deficits. Moving extremities Skin: No rashes, lesions or ulcers Psychiatry: Judgement and insight appear normal.  Mood & affect appropriate.     Data Reviewed: I have personally reviewed following labs and imaging studies  CBC: Recent Labs  Lab 10/07/19 1539 10/08/19 0448  WBC 3.9* 3.3*  NEUTROABS 3.0 2.4  HGB 6.4* 8.1*  HCT 19.1* 24.7*  MCV 95.0 94.6  PLT 224 [id number]   Basic Metabolic Panel: Recent Labs  Lab 10/07/19 1539 10/08/19 0448  NA 133* 135  K 3.8 3.6  CL 102 106  CO2 23 22  GLUCOSE 108* 97  BUN 12 11  CREATININE 0.65 0.69  CALCIUM 8.4* 7.7*   GFR: Estimated Creatinine Clearance: 55.5 mL/min (by C-G formula based on SCr of 0.69 mg/dL). Liver Function Tests: Recent Labs  Lab 10/07/19 1539 10/08/19 0448  AST 64* 60*  ALT 64* 56*  ALKPHOS 107 102  BILITOT 3.2* 3.7*  PROT 5.8* 5.6*  ALBUMIN 3.3* 3.1*   No results for input(s): LIPASE, AMYLASE in the last 168 hours. No results for input(s): AMMONIA in the last 168 hours. Coagulation Profile: No results for input(s): INR, PROTIME in the last 168 hours. Cardiac Enzymes: No results for input(s): CKTOTAL, CKMB, CKMBINDEX, TROPONINI in the last 168 hours. BNP (last 3 results) No results for input(s): PROBNP in the last 8760 hours. HbA1C: No results for input(s): HGBA1C in the last 72 hours. CBG: Recent Labs  Lab 10/08/19 0850  GLUCAP 137*   Lipid Profile: No results for input(s): CHOL, HDL, LDLCALC, TRIG, CHOLHDL, LDLDIRECT in the last 72 hours. Thyroid Function Tests: No results for input(s): TSH, T4TOTAL, FREET4, T3FREE, THYROIDAB in the last 72 hours. Anemia Panel: Recent Labs    10/07/19 1723 10/08/19 0448  FERRITIN  --  939*  TIBC  --  195*  IRON  --  59  RETICCTPCT 4.2* 4.0*   Sepsis Labs: No results for input(s): PROCALCITON, LATICACIDVEN in the last 168 hours.  Recent Results (from the past 240 hour(s))  SARS CORONAVIRUS 2 (TAT 6-24 HRS) Nasopharyngeal Nasopharyngeal Swab     Status: Abnormal   Collection Time: 10/07/19  4:30 PM   Specimen: Nasopharyngeal Swab  Result Value Ref Range Status    SARS Coronavirus 2 POSITIVE (A) NEGATIVE Final    Comment: RESULT CALLED TO, READ BACK BY AND VERIFIED WITH: W.WALLIS RN 2125 10/07/2019 MCCORMICK K (NOTE) SARS-CoV-2 target nucleic acids are DETECTED. The SARS-CoV-2 RNA is generally detectable in upper and lower respiratory specimens during the acute phase of infection. Positive results are indicative of active infection with SARS-CoV-2. Clinical  correlation with patient history and other diagnostic information is necessary to determine patient infection status. Positive results do  not rule out bacterial infection or co-infection with other viruses. The expected result is Negative. Fact Sheet for Patients: SugarRoll.be Fact Sheet for Healthcare Providers: https://www.woods-mathews.com/ This test is not yet approved or cleared by the [place] FDA and  has been authorized for detection and/or diagnosis of SARS-CoV-2 by FDA under an Emergency Use Authorization (EUA). This EUA will remain  in effect (meaning this test can be used) f or the duration of the COVID-19 declaration under Section 564(b)(1) of the Act, 21 U.S.C. section 360bbb-3(b)(1), unless the authorization is terminated or revoked sooner. Performed at [facility], [place] [address], [place], [place] [address]  Radiology Studies: Ct Chest W Contrast  Result Date: 10/07/2019 CLINICAL DATA:  72 year old with a lymphoproliferative disorder and recurrent cold agglutinin hemolytic anemia. Patient underwent Rituxan therapy in 2015. She has current symptoms of generalized weakness, fatigue and orthostasis. EXAM: CT CHEST, ABDOMEN, AND PELVIS WITH CONTRAST TECHNIQUE: Multidetector CT imaging of the chest, abdomen and pelvis was performed following the standard protocol during bolus administration of intravenous contrast. CONTRAST:  162mL OMNIPAQUE IOHEXOL 300 MG/ML IV. COMPARISON:  02/13/2014. FINDINGS: CT CHEST FINDINGS  Cardiovascular: Normal heart size. Severe three-vessel coronary atherosclerosis. RIGHT coronary artery stent. No pericardial effusion. Moderate atherosclerosis involving the thoracic aorta. Ectatic ascending thoracic aorta measuring approximately 4.1 cm diameter, only minimally increased in size since 2015 where it measured 3.9 cm. Atherosclerosis at the origin of the LEFT subclavian artery with possible hemodynamically significant stenosis. Mediastinum/Nodes: No pathologically enlarged mediastinal, hilar or axillary lymph nodes. No mediastinal masses. Normal-appearing esophagus. Normal-appearing thyroid gland. Lungs/Pleura: Scattered ground-glass airspace opacities throughout the UPPER lobes bilaterally. Blebs or air cysts in the RIGHT MIDDLE LOBE and RIGHT LOWER LOBE. Expected mild dependent atelectasis posteriorly in the lower lobes. No parenchymal nodules or masses. No pleural effusions. Central airways patent without significant bronchial wall thickening. Musculoskeletal: Degenerative disc disease and spondylosis involving the visualized lower cervical spine. Mild osseous demineralization. No acute or significant abnormalities otherwise involving the thoracic spine. CT ABDOMEN PELVIS FINDINGS Hepatobiliary: Liver normal in size and appearance. Surgically absent gallbladder. No unexpected biliary ductal dilation. Pancreas: Normal in appearance without evidence of mass, ductal dilation, or inflammation. Spleen: Moderate splenomegaly, unchanged since the 2015 CT. No focal splenic parenchymal abnormality. Adrenals/Urinary Tract: Normal appearing adrenal glands. Scarring involving the LOWER pole of the RIGHT kidney, new since the 2015 CT. No renal masses. No hydronephrosis. No urinary tract calculi. Normal appearing urinary bladder. Stomach/Bowel: Stomach normal in appearance for the degree of distention. Normal-appearing small bowel. Elongated transverse colon which extends low into the pelvis prior to extending  back upward to the splenic flexure. Descending and sigmoid colon diverticulosis, extensive in the sigmoid region, without evidence of acute diverticulitis. Normal appendix in the RIGHT mid pelvis. Vascular/Lymphatic: Moderate aorto-iliofemoral atherosclerosis without evidence of aneurysm. Normal-appearing portal venous and systemic venous systems. No pathologic lymphadenopathy. Reproductive: Surgically absent uterus. No adnexal masses. Other: None. Musculoskeletal: Degenerative disc disease at L1-2, L4-5 and L5-S1. No acute findings. IMPRESSION: 1. No evidence of recurrent lymphoma in the chest, abdomen or pelvis. 2. Scattered ground-glass airspace opacities throughout the UPPER lobes bilaterally. There are a spectrum of findings in the lungs which can be seen with acute atypical infection (as well as other non-infectious etiologies). In particular, viral pneumonia (including COVID-19) should be considered in the appropriate clinical setting. 3. No acute cardiopulmonary disease otherwise. 4. Ectatic ascending thoracic aorta measuring up to 4.1 cm diameter, only minimally increased in size since 2015 where it measured 3.9 cm. Recommend annual imaging followup by CTA or MRA. This recommendation follows 2010 ACCF/AHA/AATS/ACR/ASA/SCA/SCAI/SIR/STS/SVM Guidelines for the Diagnosis and Management of Patients with Thoracic Aortic Disease. Circulation. 2010; [id number]. 5. Possible hemodynamically significant stenosis involving the origin of the LEFT subclavian artery. 6. Stable moderate splenomegaly. 7. Descending and sigmoid colon diverticulosis without evidence of acute diverticulitis. Aortic Atherosclerosis (ICD10-I70.0). Aortic aneurysm NOS (ICD10-I71.9) Electronically Signed   By: Evangeline Dakin M.D.   On: 10/07/2019 20:17   Ct Abdomen Pelvis W Contrast  Result Date: 10/07/2019 CLINICAL DATA:  73 year old with a lymphoproliferative disorder and recurrent cold agglutinin hemolytic anemia. Patient underwent  Rituxan therapy  in 2015. She has current symptoms of generalized weakness, fatigue and orthostasis. EXAM: CT CHEST, ABDOMEN, AND PELVIS WITH CONTRAST TECHNIQUE: Multidetector CT imaging of the chest, abdomen and pelvis was performed following the standard protocol during bolus administration of intravenous contrast. CONTRAST:  151mL OMNIPAQUE IOHEXOL 300 MG/ML IV. COMPARISON:  02/13/2014. FINDINGS: CT CHEST FINDINGS Cardiovascular: Normal heart size. Severe three-vessel coronary atherosclerosis. RIGHT coronary artery stent. No pericardial effusion. Moderate atherosclerosis involving the thoracic aorta. Ectatic ascending thoracic aorta measuring approximately 4.1 cm diameter, only minimally increased in size since 2015 where it measured 3.9 cm. Atherosclerosis at the origin of the LEFT subclavian artery with possible hemodynamically significant stenosis. Mediastinum/Nodes: No pathologically enlarged mediastinal, hilar or axillary lymph nodes. No mediastinal masses. Normal-appearing esophagus. Normal-appearing thyroid gland. Lungs/Pleura: Scattered ground-glass airspace opacities throughout the UPPER lobes bilaterally. Blebs or air cysts in the RIGHT MIDDLE LOBE and RIGHT LOWER LOBE. Expected mild dependent atelectasis posteriorly in the lower lobes. No parenchymal nodules or masses. No pleural effusions. Central airways patent without significant bronchial wall thickening. Musculoskeletal: Degenerative disc disease and spondylosis involving the visualized lower cervical spine. Mild osseous demineralization. No acute or significant abnormalities otherwise involving the thoracic spine. CT ABDOMEN PELVIS FINDINGS Hepatobiliary: Liver normal in size and appearance. Surgically absent gallbladder. No unexpected biliary ductal dilation. Pancreas: Normal in appearance without evidence of mass, ductal dilation, or inflammation. Spleen: Moderate splenomegaly, unchanged since the 2015 CT. No focal splenic parenchymal  abnormality. Adrenals/Urinary Tract: Normal appearing adrenal glands. Scarring involving the LOWER pole of the RIGHT kidney, new since the 2015 CT. No renal masses. No hydronephrosis. No urinary tract calculi. Normal appearing urinary bladder. Stomach/Bowel: Stomach normal in appearance for the degree of distention. Normal-appearing small bowel. Elongated transverse colon which extends low into the pelvis prior to extending back upward to the splenic flexure. Descending and sigmoid colon diverticulosis, extensive in the sigmoid region, without evidence of acute diverticulitis. Normal appendix in the RIGHT mid pelvis. Vascular/Lymphatic: Moderate aorto-iliofemoral atherosclerosis without evidence of aneurysm. Normal-appearing portal venous and systemic venous systems. No pathologic lymphadenopathy. Reproductive: Surgically absent uterus. No adnexal masses. Other: None. Musculoskeletal: Degenerative disc disease at L1-2, L4-5 and L5-S1. No acute findings. IMPRESSION: 1. No evidence of recurrent lymphoma in the chest, abdomen or pelvis. 2. Scattered ground-glass airspace opacities throughout the UPPER lobes bilaterally. There are a spectrum of findings in the lungs which can be seen with acute atypical infection (as well as other non-infectious etiologies). In particular, viral pneumonia (including COVID-19) should be considered in the appropriate clinical setting. 3. No acute cardiopulmonary disease otherwise. 4. Ectatic ascending thoracic aorta measuring up to 4.1 cm diameter, only minimally increased in size since 2015 where it measured 3.9 cm. Recommend annual imaging followup by CTA or MRA. This recommendation follows 2010 ACCF/AHA/AATS/ACR/ASA/SCA/SCAI/SIR/STS/SVM Guidelines for the Diagnosis and Management of Patients with Thoracic Aortic Disease. Circulation. 2010; [id number]. 5. Possible hemodynamically significant stenosis involving the origin of the LEFT subclavian artery. 6. Stable moderate  splenomegaly. 7. Descending and sigmoid colon diverticulosis without evidence of acute diverticulitis. Aortic Atherosclerosis (ICD10-I70.0). Aortic aneurysm NOS (ICD10-I71.9) Electronically Signed   By: Evangeline Dakin M.D.   On: 10/07/2019 20:17   Dg Chest Portable 1 View  Result Date: 10/07/2019 CLINICAL DATA:  Cough, anemia, weakness EXAM: PORTABLE CHEST 1 VIEW COMPARISON:  Radiograph 11/16/2007 FINDINGS: No consolidation, features of edema, pneumothorax, or effusion. Pulmonary vascularity is normally distributed. The cardiomediastinal contours are unremarkable. No acute osseous or soft tissue abnormality. Cardiac monitoring leads overlie the chest.  Vascular calcium in the left neck. IMPRESSION: No acute cardiopulmonary abnormality. Electronically Signed   By: Lovena Le M.D.   On: 10/07/2019 16:30        Scheduled Meds:  sodium chloride   Intravenous Once   buPROPion  300 mg Oral q morning - 10a   docusate sodium  100 mg Oral BID   folic acid  2 mg Oral Daily   levothyroxine  88 mcg Oral QAC breakfast   methylPREDNISolone (SOLU-MEDROL) injection  60 mg Intravenous Q12H   senna  1 tablet Oral BID   Continuous Infusions:  sodium chloride 100 mL/hr at 10/08/19 [id number]   remdesivir 200 mg in NS 250 mL 200 mg (10/08/19 1007)   Followed by   Derrill Memo ON 10/09/2019] remdesivir 100 mg in NS 250 mL            Aline August, MD [facility] 10/08/2019, 10:28 AM

## 2019-10-08 NOTE — Progress Notes (Signed)
Patient was seen and examined by my partner and colleague Dr. Aline August at [facility] today and I am in agreement with his assessment and plan. I have reviewed her chart and will await for her arrival to [facility] from the [facility] emergency room.  Currently is unknown when she will be transported and arriving. She has been started on treatment for Covid pneumonia given her CT findings of scattered groundglass airspace opacities in upper lobes bilaterally.  I am in agreement with starting steroids as well as remdesivir.  Appreciate further evaluation and recommendation by Dr. Marin Olp of Oncology.  Need to continue monitor her blood count carefully.  We will continue to monitor her clinical response to intervention and repeat blood work and imaging in the a.m.

## 2019-10-08 NOTE — ED Notes (Signed)
Report attempted to [facility] but was told the nurse receiving this patient is in another patient's room and would call me back.

## 2019-10-08 NOTE — ED Notes (Signed)
Breakfast tray ordered 

## 2019-10-08 NOTE — ED Notes (Signed)
Ordered bfast 

## 2019-10-08 NOTE — ED Notes (Signed)
Report attempted x2

## 2019-10-08 NOTE — ED Notes (Signed)
Dinner tray given to patient. No other needs at this time.

## 2019-10-08 NOTE — ED Notes (Signed)
Lunch Tray Ordered @ 1113.  

## 2019-10-08 NOTE — ED Notes (Signed)
[facility] at facility for transport 

## 2019-10-08 NOTE — ED Notes (Signed)
NP Kyere advised patient will still be able to be admitted at [facility] even with covid positive test.

## 2019-10-09 ENCOUNTER — Encounter (HOSPITAL_COMMUNITY): Payer: Self-pay | Admitting: *Deleted

## 2019-10-09 DIAGNOSIS — U071 COVID-19: Secondary | ICD-10-CM | POA: Diagnosis present

## 2019-10-09 DIAGNOSIS — E039 Hypothyroidism, unspecified: Secondary | ICD-10-CM

## 2019-10-09 DIAGNOSIS — D649 Anemia, unspecified: Secondary | ICD-10-CM | POA: Diagnosis not present

## 2019-10-09 LAB — PROTEIN ELECTROPHORESIS, SERUM
A/G Ratio: 1.4 (ref 0.7–1.7)
Albumin ELP: 3.2 g/dL (ref 2.9–4.4)
Alpha-1-Globulin: 0.3 g/dL (ref 0.0–0.4)
Alpha-2-Globulin: 0.5 g/dL (ref 0.4–1.0)
Beta Globulin: 1 g/dL (ref 0.7–1.3)
Gamma Globulin: 0.6 g/dL (ref 0.4–1.8)
Globulin, Total: 2.3 g/dL (ref 2.2–3.9)
Total Protein ELP: 5.5 g/dL — ABNORMAL LOW (ref 6.0–8.5)

## 2019-10-09 LAB — SURGICAL PATHOLOGY

## 2019-10-09 LAB — URINE CULTURE: Culture: <10000 — AB

## 2019-10-09 MED ORDER — HYDROCHLOROTHIAZIDE 12.5 MG PO CAPS
12.5000 mg | ORAL_CAPSULE | Freq: Every day | ORAL | Status: DC
  Administered 2019-10-09 – 2019-10-12 (×4): 12.5 mg via ORAL
  Filled 2019-10-09 (×5): qty 1

## 2019-10-09 NOTE — Progress Notes (Signed)
PROGRESS NOTE    Christine [name]  [id number] DOB: 1946-01-10 DOA: 10/07/2019 PCP: Marton Redwood, MD    Brief Narrative: 73 year old female with history of cold agglutinin hemolytic anemia, DVT, depression, status post 1 week of Rituxan completed in 2015, followed by Dr. Marin Olp presented on 10/07/2019 with worsening weakness, low energy and lightheadedness.  She went to her PCP's office on 10/07/2019 and was found to have low hemoglobin along with orthostatic hypotension and subsequently referred to ED.  She was found to have hemoglobin of 6.4.  Chest x-ray was negative for acute cardiopulmonary disease.  She was transfused 2 unit packed red cells.  Oncology/Dr. Marin Olp was consulted who recommended transfer to [facility].  CT of the chest abdomen and pelvis was ordered.   Assessment & Plan:   Principal Problem:   Symptomatic anemia Active Problems:   COVID-19 virus infection   Anemia   ANXIETY DEPRESSION   Essential hypertension   Cold agglutinin disease   Transaminasemia   Hypothyroidism   1 symptomatic anemia likely hemolytic in nature Patient with history of cold agglutinin hemolytic anemia.  On presentation patient noted to have a hemoglobin of 6.4.  Status post transfusion of 2 units packed red blood cells hemoglobin currently stable at 8.1 of 10/08/2019.  Labs pending.  Patient with some clinical improvement.  CT abdomen and pelvis done with stable splenomegaly but no obvious lymphadenopathy.  Patient with no overt GI bleed.  LDH elevated at 365.  EMEA panel consistent with anemia of chronic disease.  Patient with elevated ferritin level however patient also positive for COVID-19.  Per oncology flow cytometry on peripheral blood did not show any obvious monoclonal B-cell population.  Patient transferred to [facility] by recommendations of hematology/oncology in anticipation of possible treatment for cold agglutinin hemolytic anemia.  Oncology following and  appreciate input and recommendations.  Per oncology.  2.  COVID-19 pneumonia CT chest which was done with no recurrent lymphoma however did show scattered groundglass airspace opacities throughout upper lobes bilaterally along with possibility of viral pneumonia..  Patient afebrile.  Patient denies any significant shortness of breath.  Patient is not hypoxic.  Ferritin level noted to be elevated at 939.  Patient started on IV remdesivir and IV Solu-Medrol which we will continue for now.  Supportive care.  3.  Hypertension Patient noted to be orthostatic early on during the hospitalization as such antihypertensive medications held.  Will resume home regimen of HCTZ.  Follow.  4.  Transaminitis Follow LFTs.  5.  Possible hemodynamically significant stenosis involving origin of left subclavian artery Dr. Starla Link discussed with Dr. Doren Custard, vascular surgeon on the phone on 10/08/2019 who recommended outpatient vascular surgery follow-up.  6.  Hypothyroidism Continue home dose Synthroid.   DVT prophylaxis: SCDs Code Status: Full Family Communication: Updated patient.  No family at bedside. Disposition Plan: Likely home when okay with hematology/oncology and completion of remdesivir with no hypoxia and clinical improvement.   Consultants:   Hematology/oncology: Dr. Marin Olp 10/07/2019  Procedures:   CT chest 10/07/2019  CT abdomen and pelvis 10/07/2019  Chest x-ray 10/07/2019  Transfused 2 units packed red blood cells with blood warmer 10/07/2019  Antimicrobials:   None   Subjective: Patient sitting up in bed.  Patient feeling better than on admission with improvement with weakness after transfusion of packed red blood cells.  Patient concerned she has cold agglutinin hemolytic anemia as told to her by her oncologist.  Objective: Vitals:   10/08/19 [id number] 10/08/19 2339 10/09/19  0300 10/09/19 1516  BP:  139/81  (!) 142/82  Pulse:  72  68  Resp:  17  19  Temp:  98.2 F (36.8 C)   98.3 F (36.8 C)  TempSrc:  Oral  Oral  SpO2:  100%  99%  Weight: 61.8 kg  61.8 kg   Height: 5\' 5"  (1.651 m)       Intake/Output Summary (Last 24 hours) at 10/09/2019 1745 Last data filed at 10/09/2019 1500 Gross per 24 hour  Intake 2600 ml  Output 1100 ml  Net 1500 ml   Filed Weights   10/07/19 1535 10/08/19 2335 10/09/19 0300  Weight: 58.5 kg 61.8 kg 61.8 kg    Examination:  General exam: Appears calm and comfortable  Respiratory system: Clear to auscultation. Respiratory effort normal. Cardiovascular system: S1 & S2 heard, RRR. No JVD, murmurs, rubs, gallops or clicks. No pedal edema. Gastrointestinal system: Abdomen is nondistended, soft and nontender. No organomegaly or masses felt. Normal bowel sounds heard. Central nervous system: Alert and oriented. No focal neurological deficits. Extremities: Symmetric 5 x 5 power. Skin: No rashes, lesions or ulcers Psychiatry: Judgement and insight appear normal. Mood & affect appropriate.     Data Reviewed: I have personally reviewed following labs and imaging studies  CBC: Recent Labs  Lab 10/07/19 1539 10/08/19 0448  WBC 3.9* 3.3*  NEUTROABS 3.0 2.4  HGB 6.4* 8.1*  HCT 19.1* 24.7*  MCV 95.0 94.6  PLT 224 [id number]   Basic Metabolic Panel: Recent Labs  Lab 10/07/19 1539 10/08/19 0448  NA 133* 135  K 3.8 3.6  CL 102 106  CO2 23 22  GLUCOSE 108* 97  BUN 12 11  CREATININE 0.65 0.69  CALCIUM 8.4* 7.7*   GFR: Estimated Creatinine Clearance: 55.5 mL/min (by C-G formula based on SCr of 0.69 mg/dL). Liver Function Tests: Recent Labs  Lab 10/07/19 1539 10/08/19 0448  AST 64* 60*  ALT 64* 56*  ALKPHOS 107 102  BILITOT 3.2* 3.7*  PROT 5.8* 5.6*  ALBUMIN 3.3* 3.1*   No results for input(s): LIPASE, AMYLASE in the last 168 hours. No results for input(s): AMMONIA in the last 168 hours. Coagulation Profile: No results for input(s): INR, PROTIME in the last 168 hours. Cardiac Enzymes: No results for input(s):  CKTOTAL, CKMB, CKMBINDEX, TROPONINI in the last 168 hours. BNP (last 3 results) No results for input(s): PROBNP in the last 8760 hours. HbA1C: No results for input(s): HGBA1C in the last 72 hours. CBG: Recent Labs  Lab 10/08/19 0850  GLUCAP 137*   Lipid Profile: No results for input(s): CHOL, HDL, LDLCALC, TRIG, CHOLHDL, LDLDIRECT in the last 72 hours. Thyroid Function Tests: No results for input(s): TSH, T4TOTAL, FREET4, T3FREE, THYROIDAB in the last 72 hours. Anemia Panel: Recent Labs    10/07/19 1723 10/08/19 0448  FERRITIN  --  939*  TIBC  --  195*  IRON  --  59  RETICCTPCT 4.2* 4.0*   Sepsis Labs: No results for input(s): PROCALCITON, LATICACIDVEN in the last 168 hours.  Recent Results (from the past 240 hour(s))  Urine Culture     Status: Abnormal   Collection Time: 10/07/19  2:14 AM   Specimen: Urine, Random  Result Value Ref Range Status   Specimen Description URINE, RANDOM  Final   Special Requests NONE  Final   Culture (A)  Final    <10,000 COLONIES/mL INSIGNIFICANT GROWTH Performed at [facility], [address] [place], [place] [address]  Report Status 10/09/2019 FINAL  Final  SARS CORONAVIRUS 2 (TAT 6-24 HRS) Nasopharyngeal Nasopharyngeal Swab     Status: Abnormal   Collection Time: 10/07/19  4:30 PM   Specimen: Nasopharyngeal Swab  Result Value Ref Range Status   SARS Coronavirus 2 POSITIVE (A) NEGATIVE Final    Comment: RESULT CALLED TO, READ BACK BY AND VERIFIED WITH: W.WALLIS RN 2125 10/07/2019 MCCORMICK K (NOTE) SARS-CoV-2 target nucleic acids are DETECTED. The SARS-CoV-2 RNA is generally detectable in upper and lower respiratory specimens during the acute phase of infection. Positive results are indicative of active infection with SARS-CoV-2. Clinical  correlation with patient history and other diagnostic information is necessary to determine patient infection status. Positive results do  not rule out bacterial infection or  co-infection with other viruses. The expected result is Negative. Fact Sheet for Patients: SugarRoll.be Fact Sheet for Healthcare Providers: https://www.woods-mathews.com/ This test is not yet approved or cleared by the [place] FDA and  has been authorized for detection and/or diagnosis of SARS-CoV-2 by FDA under an Emergency Use Authorization (EUA). This EUA will remain  in effect (meaning this test can be used) f or the duration of the COVID-19 declaration under Section 564(b)(1) of the Act, 21 U.S.C. section 360bbb-3(b)(1), unless the authorization is terminated or revoked sooner. Performed at [facility], [place] [address], [place], [place] [address]          Radiology Studies: Ct Chest W Contrast  Result Date: 10/07/2019 CLINICAL DATA:  73 year old with a lymphoproliferative disorder and recurrent cold agglutinin hemolytic anemia. Patient underwent Rituxan therapy in 2015. She has current symptoms of generalized weakness, fatigue and orthostasis. EXAM: CT CHEST, ABDOMEN, AND PELVIS WITH CONTRAST TECHNIQUE: Multidetector CT imaging of the chest, abdomen and pelvis was performed following the standard protocol during bolus administration of intravenous contrast. CONTRAST:  15mL OMNIPAQUE IOHEXOL 300 MG/ML IV. COMPARISON:  02/13/2014. FINDINGS: CT CHEST FINDINGS Cardiovascular: Normal heart size. Severe three-vessel coronary atherosclerosis. RIGHT coronary artery stent. No pericardial effusion. Moderate atherosclerosis involving the thoracic aorta. Ectatic ascending thoracic aorta measuring approximately 4.1 cm diameter, only minimally increased in size since 2015 where it measured 3.9 cm. Atherosclerosis at the origin of the LEFT subclavian artery with possible hemodynamically significant stenosis. Mediastinum/Nodes: No pathologically enlarged mediastinal, hilar or axillary lymph nodes. No mediastinal masses. Normal-appearing esophagus.  Normal-appearing thyroid gland. Lungs/Pleura: Scattered ground-glass airspace opacities throughout the UPPER lobes bilaterally. Blebs or air cysts in the RIGHT MIDDLE LOBE and RIGHT LOWER LOBE. Expected mild dependent atelectasis posteriorly in the lower lobes. No parenchymal nodules or masses. No pleural effusions. Central airways patent without significant bronchial wall thickening. Musculoskeletal: Degenerative disc disease and spondylosis involving the visualized lower cervical spine. Mild osseous demineralization. No acute or significant abnormalities otherwise involving the thoracic spine. CT ABDOMEN PELVIS FINDINGS Hepatobiliary: Liver normal in size and appearance. Surgically absent gallbladder. No unexpected biliary ductal dilation. Pancreas: Normal in appearance without evidence of mass, ductal dilation, or inflammation. Spleen: Moderate splenomegaly, unchanged since the 2015 CT. No focal splenic parenchymal abnormality. Adrenals/Urinary Tract: Normal appearing adrenal glands. Scarring involving the LOWER pole of the RIGHT kidney, new since the 2015 CT. No renal masses. No hydronephrosis. No urinary tract calculi. Normal appearing urinary bladder. Stomach/Bowel: Stomach normal in appearance for the degree of distention. Normal-appearing small bowel. Elongated transverse colon which extends low into the pelvis prior to extending back upward to the splenic flexure. Descending and sigmoid colon diverticulosis, extensive in the sigmoid region, without evidence of acute diverticulitis.  Normal appendix in the RIGHT mid pelvis. Vascular/Lymphatic: Moderate aorto-iliofemoral atherosclerosis without evidence of aneurysm. Normal-appearing portal venous and systemic venous systems. No pathologic lymphadenopathy. Reproductive: Surgically absent uterus. No adnexal masses. Other: None. Musculoskeletal: Degenerative disc disease at L1-2, L4-5 and L5-S1. No acute findings. IMPRESSION: 1. No evidence of recurrent lymphoma  in the chest, abdomen or pelvis. 2. Scattered ground-glass airspace opacities throughout the UPPER lobes bilaterally. There are a spectrum of findings in the lungs which can be seen with acute atypical infection (as well as other non-infectious etiologies). In particular, viral pneumonia (including COVID-19) should be considered in the appropriate clinical setting. 3. No acute cardiopulmonary disease otherwise. 4. Ectatic ascending thoracic aorta measuring up to 4.1 cm diameter, only minimally increased in size since 2015 where it measured 3.9 cm. Recommend annual imaging followup by CTA or MRA. This recommendation follows 2010 ACCF/AHA/AATS/ACR/ASA/SCA/SCAI/SIR/STS/SVM Guidelines for the Diagnosis and Management of Patients with Thoracic Aortic Disease. Circulation. 2010; [id number]. 5. Possible hemodynamically significant stenosis involving the origin of the LEFT subclavian artery. 6. Stable moderate splenomegaly. 7. Descending and sigmoid colon diverticulosis without evidence of acute diverticulitis. Aortic Atherosclerosis (ICD10-I70.0). Aortic aneurysm NOS (ICD10-I71.9) Electronically Signed   By: Evangeline Dakin M.D.   On: 10/07/2019 20:17   Ct Abdomen Pelvis W Contrast  Result Date: 10/07/2019 CLINICAL DATA:  73 year old with a lymphoproliferative disorder and recurrent cold agglutinin hemolytic anemia. Patient underwent Rituxan therapy in 2015. She has current symptoms of generalized weakness, fatigue and orthostasis. EXAM: CT CHEST, ABDOMEN, AND PELVIS WITH CONTRAST TECHNIQUE: Multidetector CT imaging of the chest, abdomen and pelvis was performed following the standard protocol during bolus administration of intravenous contrast. CONTRAST:  145mL OMNIPAQUE IOHEXOL 300 MG/ML IV. COMPARISON:  02/13/2014. FINDINGS: CT CHEST FINDINGS Cardiovascular: Normal heart size. Severe three-vessel coronary atherosclerosis. RIGHT coronary artery stent. No pericardial effusion. Moderate atherosclerosis involving  the thoracic aorta. Ectatic ascending thoracic aorta measuring approximately 4.1 cm diameter, only minimally increased in size since 2015 where it measured 3.9 cm. Atherosclerosis at the origin of the LEFT subclavian artery with possible hemodynamically significant stenosis. Mediastinum/Nodes: No pathologically enlarged mediastinal, hilar or axillary lymph nodes. No mediastinal masses. Normal-appearing esophagus. Normal-appearing thyroid gland. Lungs/Pleura: Scattered ground-glass airspace opacities throughout the UPPER lobes bilaterally. Blebs or air cysts in the RIGHT MIDDLE LOBE and RIGHT LOWER LOBE. Expected mild dependent atelectasis posteriorly in the lower lobes. No parenchymal nodules or masses. No pleural effusions. Central airways patent without significant bronchial wall thickening. Musculoskeletal: Degenerative disc disease and spondylosis involving the visualized lower cervical spine. Mild osseous demineralization. No acute or significant abnormalities otherwise involving the thoracic spine. CT ABDOMEN PELVIS FINDINGS Hepatobiliary: Liver normal in size and appearance. Surgically absent gallbladder. No unexpected biliary ductal dilation. Pancreas: Normal in appearance without evidence of mass, ductal dilation, or inflammation. Spleen: Moderate splenomegaly, unchanged since the 2015 CT. No focal splenic parenchymal abnormality. Adrenals/Urinary Tract: Normal appearing adrenal glands. Scarring involving the LOWER pole of the RIGHT kidney, new since the 2015 CT. No renal masses. No hydronephrosis. No urinary tract calculi. Normal appearing urinary bladder. Stomach/Bowel: Stomach normal in appearance for the degree of distention. Normal-appearing small bowel. Elongated transverse colon which extends low into the pelvis prior to extending back upward to the splenic flexure. Descending and sigmoid colon diverticulosis, extensive in the sigmoid region, without evidence of acute diverticulitis. Normal appendix  in the RIGHT mid pelvis. Vascular/Lymphatic: Moderate aorto-iliofemoral atherosclerosis without evidence of aneurysm. Normal-appearing portal venous and systemic venous systems. No pathologic lymphadenopathy. Reproductive: Surgically absent  uterus. No adnexal masses. Other: None. Musculoskeletal: Degenerative disc disease at L1-2, L4-5 and L5-S1. No acute findings. IMPRESSION: 1. No evidence of recurrent lymphoma in the chest, abdomen or pelvis. 2. Scattered ground-glass airspace opacities throughout the UPPER lobes bilaterally. There are a spectrum of findings in the lungs which can be seen with acute atypical infection (as well as other non-infectious etiologies). In particular, viral pneumonia (including COVID-19) should be considered in the appropriate clinical setting. 3. No acute cardiopulmonary disease otherwise. 4. Ectatic ascending thoracic aorta measuring up to 4.1 cm diameter, only minimally increased in size since 2015 where it measured 3.9 cm. Recommend annual imaging followup by CTA or MRA. This recommendation follows 2010 ACCF/AHA/AATS/ACR/ASA/SCA/SCAI/SIR/STS/SVM Guidelines for the Diagnosis and Management of Patients with Thoracic Aortic Disease. Circulation. 2010; [id number]. 5. Possible hemodynamically significant stenosis involving the origin of the LEFT subclavian artery. 6. Stable moderate splenomegaly. 7. Descending and sigmoid colon diverticulosis without evidence of acute diverticulitis. Aortic Atherosclerosis (ICD10-I70.0). Aortic aneurysm NOS (ICD10-I71.9) Electronically Signed   By: Evangeline Dakin M.D.   On: 10/07/2019 20:17        Scheduled Meds:  sodium chloride   Intravenous Once   buPROPion  300 mg Oral q morning - 10a   docusate sodium  100 mg Oral BID   folic acid  2 mg Oral Daily   levothyroxine  88 mcg Oral QAC breakfast   methylPREDNISolone (SOLU-MEDROL) injection  60 mg Intravenous Q12H   senna  1 tablet Oral BID   Continuous Infusions:  sodium  chloride 100 mL/hr at 10/09/19 1212   remdesivir 100 mg in NS 250 mL 100 mg (10/09/19 1220)     LOS: 2 days    Time spent: 35 minutes    Irine Seal, MD [facility]  If 7PM-7AM, please contact night-coverage [email or web address] 10/09/2019, 5:45 PM

## 2019-10-09 NOTE — Progress Notes (Signed)
Pharmacy: Remdesivir   Christine [name] is a(n) 73 y.o. female admitted with COVID.  Pharmacy has been consulted for remdesivir dosing.    Assessment: . ALT < 220: yes  Must meet ONE of the following: . Evidence of pneumonia on CXR: yes . Supplemental O2 requirement to maintain > 94% saturation: no    Plan:  . Continue Remdesivir 100 mg IV daily to complete 5 day course . Daily CMET while on remdesivir . Follow ALT and clinical condition   Reuel Boom, PharmD, BCPS [phone] 10/09/2019, 1:02 PM

## 2019-10-09 NOTE — Progress Notes (Signed)
Christine [name] is now over at [facility].  Surprisingly, she did test positive for the coronavirus.  She is asymptomatic for the most part.  She does not have a cough.  There is no shortness of breath.  I think on a chest x-ray or CT scan, she probably had some pulmonary infiltrates.  On her CT scan, there is no obvious lymphadenopathy.  She had moderate but stable splenomegaly.  There is no lab work back yet today.    She got 2 units of blood while in the emergency room at [facility].  She said this really helped her out.  Again, with the coronavirus, we are going to be limited as to how we can try to treat the recurrent hemolytic anemia.  Her flow cytometry on the peripheral blood did not show any obvious monoclonal B-cell population.  I was going to use Rituxan with bendamustine.  I thought this would be a reasonable treatment approach for her.  However, with the coronavirus, I certainly cannot utilize bendamustine.  I do think Rituxan would be safe to use.  She has had no melena or hematochezia.  There is no nausea or vomiting.  Her LDH is quite elevated at 365.  Her iron studies look okay.  I do not believe there is any iron deficiency.  I think we will have to see how her blood counts trend.  I would like to see what her cold agglutinin titer is.  Hopefully, we might be able to hold on the Rituxan for right now.  I need to make sure that she is on folic acid.  Of note, she is on Solu-Medrol.  I suspect this is for the coronavirus.  I know that she will get outstanding care from all the staff on [address].  Lattie Haw, MD  Psalm 118:14

## 2019-10-10 ENCOUNTER — Encounter: Payer: Self-pay | Admitting: Physical Therapy

## 2019-10-10 DIAGNOSIS — D649 Anemia, unspecified: Secondary | ICD-10-CM | POA: Diagnosis not present

## 2019-10-10 LAB — COMPREHENSIVE METABOLIC PANEL
ALT: 74 U/L — ABNORMAL HIGH (ref 0–44)
AST: 50 U/L — ABNORMAL HIGH (ref 15–41)
Albumin: 3.3 g/dL — ABNORMAL LOW (ref 3.5–5.0)
Alkaline Phosphatase: 118 U/L (ref 38–126)
Anion gap: 8 (ref 5–15)
BUN: 11 mg/dL (ref 8–23)
CO2: 22 mmol/L (ref 22–32)
Calcium: 8.1 mg/dL — ABNORMAL LOW (ref 8.9–10.3)
Chloride: 105 mmol/L (ref 98–111)
Creatinine, Ser: 0.63 mg/dL (ref 0.44–1.00)
GFR calc Af Amer: >60 mL/min (ref >60)
GFR calc non Af Amer: >60 mL/min (ref >60)
Glucose, Bld: 151 mg/dL — ABNORMAL HIGH (ref 70–99)
Potassium: 4.2 mmol/L (ref 3.5–5.1)
Sodium: 135 mmol/L (ref 135–145)
Total Bilirubin: 1.3 mg/dL — ABNORMAL HIGH (ref 0.3–1.2)
Total Protein: 5.9 g/dL — ABNORMAL LOW (ref 6.5–8.1)

## 2019-10-10 LAB — BPAM RBC
Blood Product Expiration Date: 202011282359
Blood Product Expiration Date: 202011282359
Blood Product Expiration Date: 202011282359
Blood Product Expiration Date: 202011292359
ISSUE DATE / TIME: 202010262020
ISSUE DATE / TIME: 202010262210
Unit Type and Rh: 5100
Unit Type and Rh: 5100
Unit Type and Rh: 5100
Unit Type and Rh: 5100

## 2019-10-10 LAB — TYPE AND SCREEN
ABO/RH(D): AB POS
Antibody Screen: NEGATIVE
Unit division: 0
Unit division: 0
Unit division: 0
Unit division: 0

## 2019-10-10 LAB — RETICULOCYTES
Immature Retic Fract: 28.1 % — ABNORMAL HIGH (ref 2.3–15.9)
RBC.: 2.74 MIL/uL — ABNORMAL LOW (ref 3.87–5.11)
Retic Count, Absolute: 94.8 10*3/uL (ref 19.0–186.0)
Retic Ct Pct: 3.5 % — ABNORMAL HIGH (ref 0.4–3.1)

## 2019-10-10 LAB — CBC WITH DIFFERENTIAL/PLATELET
Abs Immature Granulocytes: 0.06 10*3/uL (ref 0.00–0.07)
Basophils Absolute: 0 10*3/uL (ref 0.0–0.1)
Basophils Relative: 0 %
Eosinophils Absolute: 0 10*3/uL (ref 0.0–0.5)
Eosinophils Relative: 0 %
HCT: 26.2 % — ABNORMAL LOW (ref 36.0–46.0)
Hemoglobin: 8.3 g/dL — ABNORMAL LOW (ref 12.0–15.0)
Immature Granulocytes: 1 %
Lymphocytes Relative: 15 %
Lymphs Abs: 0.6 10*3/uL — ABNORMAL LOW (ref 0.7–4.0)
MCH: 30.3 pg (ref 26.0–34.0)
MCHC: 31.7 g/dL (ref 30.0–36.0)
MCV: 95.6 fL (ref 80.0–100.0)
Monocytes Absolute: 0.2 10*3/uL (ref 0.1–1.0)
Monocytes Relative: 4 %
Neutro Abs: 3.5 10*3/uL (ref 1.7–7.7)
Neutrophils Relative %: 80 %
Platelets: 221 10*3/uL (ref 150–400)
RBC: 2.74 MIL/uL — ABNORMAL LOW (ref 3.87–5.11)
RDW: 16.2 % — ABNORMAL HIGH (ref 11.5–15.5)
WBC: 4.3 10*3/uL (ref 4.0–10.5)
nRBC: 0 % (ref 0.0–0.2)

## 2019-10-10 LAB — KAPPA/LAMBDA LIGHT CHAINS
Kappa free light chain: 128.5 mg/L — ABNORMAL HIGH (ref 3.3–19.4)
Kappa, lambda light chain ratio: 6.15 — ABNORMAL HIGH (ref 0.26–1.65)
Lambda free light chains: 20.9 mg/L (ref 5.7–26.3)

## 2019-10-10 LAB — C-REACTIVE PROTEIN: CRP: 2.8 mg/dL — ABNORMAL HIGH (ref <1.0)

## 2019-10-10 LAB — IGG, IGA, IGM
IgA: 104 mg/dL (ref 64–422)
IgG (Immunoglobin G), Serum: 648 mg/dL (ref 586–1602)
IgM (Immunoglobulin M), Srm: 454 mg/dL — ABNORMAL HIGH (ref 26–217)

## 2019-10-10 LAB — MAGNESIUM: Magnesium: 2.1 mg/dL (ref 1.7–2.4)

## 2019-10-10 LAB — LACTATE DEHYDROGENASE: LDH: 313 U/L — ABNORMAL HIGH (ref 98–192)

## 2019-10-10 LAB — FERRITIN: Ferritin: 1264 ng/mL — ABNORMAL HIGH (ref 11–307)

## 2019-10-10 LAB — GLUCOSE, CAPILLARY: Glucose-Capillary: 152 mg/dL — ABNORMAL HIGH (ref 70–99)

## 2019-10-10 MED ORDER — SENNA 8.6 MG PO TABS: 1.0000 | ORAL_TABLET | Freq: Every day | ORAL | Status: DC | PRN

## 2019-10-10 NOTE — Progress Notes (Signed)
Christine [name] is doing okay.  She has the coronavirus.  She is on remdesivir.  She feels okay.  She has no cough or shortness of breath.  Her hemoglobin is stable right now.  Her hemoglobin is 8.3.  Her retake count is coming down a little bit.  I do believe that a lot of this is probably from her prednisone.  Given that everything is holding steady, we do not have to give her Rituxan right now.  Hopefully we will be able to hold on Rituxan till she tests negative for the coronavirus.  We can then also give her bendamustine.  Her LFTs are slightly higher.  Her LDH seems to be coming down.  Her bilirubin also improving nicely.  I am still awaiting the cold agglutinin titer.  Her vital signs all look stable.  Her temperature is 97.6.  Pulse 61.  Blood pressure 153/83.  There really is no overall change in her examination.  Overall, I suspect that the cold agglutinin disease is stable right now.  Again I have to believe that a lot of this is from the steroid that she is being given.  Hopefully, she will be able to go home soon.  We will be able to treat her as an outpatient once we know that she is coronavirus negative.  I am incredibly impressed with the great care that she is getting from the staff up on [address].  Lattie Haw, MD  Proverbs 3:5-6

## 2019-10-10 NOTE — Progress Notes (Signed)
PROGRESS NOTE    Christine [name]  [id number] DOB: 1946/09/17 DOA: 10/07/2019 PCP: Marton Redwood, MD    Brief Narrative: 73 year old female with history of cold agglutinin hemolytic anemia, DVT, depression, status post 1 week of Rituxan completed in 2015, followed by Dr. Marin Olp presented on 10/07/2019 with worsening weakness, low energy and lightheadedness.  She went to her PCP's office on 10/07/2019 and was found to have low hemoglobin along with orthostatic hypotension and subsequently referred to ED.  She was found to have hemoglobin of 6.4.  Chest x-ray was negative for acute cardiopulmonary disease.  She was transfused 2 unit packed red cells.  Oncology/Dr. Marin Olp was consulted who recommended transfer to [facility].  CT of the chest abdomen and pelvis was ordered.   Assessment & Plan:   Principal Problem:   Symptomatic anemia Active Problems:   COVID-19 virus infection   Anemia   ANXIETY DEPRESSION   Essential hypertension   Cold agglutinin disease   Transaminasemia   Hypothyroidism   1 symptomatic anemia likely hemolytic in nature Patient with history of cold agglutinin hemolytic anemia.  On presentation patient noted to have a hemoglobin of 6.4.  Status post transfusion of 2 units packed red blood cells hemoglobin currently stable at 8.3 today. Patient with some clinical improvement.  CT abdomen and pelvis done with stable splenomegaly but no obvious lymphadenopathy.  Patient with no overt GI bleed.  LDH elevated at 365.  Anemia panel consistent with anemia of chronic disease.  Patient with elevated ferritin level however patient also positive for COVID-19.  Per oncology flow cytometry on peripheral blood did not show any obvious monoclonal B-cell population.  Patient transferred to [facility] by recommendations of hematology/oncology in anticipation of possible treatment for cold agglutinin hemolytic anemia.  Oncology following are recommending treatment as  outpatient was coronavirus has been treated and negative per oncology.  Per oncology.  2.  COVID-19 pneumonia CT chest which was done with no recurrent lymphoma however did show scattered groundglass airspace opacities throughout upper lobes bilaterally along with possibility of viral pneumonia..  Patient afebrile.  Patient denies any significant shortness of breath.  Patient is not hypoxic.  Ferritin level noted to be elevated at 939 and increasing currently at 1264.Marland Kitchen  Patient started on IV remdesivir and IV Solu-Medrol which we will continue for now.  Supportive care.  3.  Hypertension Patient noted to be orthostatic early on during the hospitalization as such antihypertensive medications held.  HCTZ resumed.  Follow.   4.  Transaminitis Follow LFTs.  5.  Possible hemodynamically significant stenosis involving origin of left subclavian artery Dr. Starla Link discussed with Dr. Scot Dock, vascular surgeon on the phone on 10/08/2019 who recommended outpatient vascular surgery follow-up.  6.  Hypothyroidism Continue home dose Synthroid.   DVT prophylaxis: SCDs Code Status: Full Family Communication: Updated patient.  No family at bedside. Disposition Plan: Transfer to [place].  Likely home when okay with hematology/oncology and completion of remdesivir with no hypoxia and clinical improvement.   Consultants:   Hematology/oncology: Dr. Marin Olp 10/07/2019  Procedures:   CT chest 10/07/2019  CT abdomen and pelvis 10/07/2019  Chest x-ray 10/07/2019  Transfused 2 units packed red blood cells with blood warmer 10/07/2019  Antimicrobials:   None   Subjective: Patient laying in bed.  Denies any chest pain or shortness of breath.  Weakness improving.   Objective: Vitals:   10/09/19 1516 10/09/19 2301 10/10/19 0451 10/10/19 0500  BP: (!) 142/82 140/74 (!) 153/83  Pulse: 68 64 61   Resp: 19 18 18    Temp: 98.3 F (36.8 C) 98.2 F (36.8 C) 97.6 F (36.4 C)   TempSrc: Oral Oral Oral    SpO2: 99% 100% 100%   Weight:    61.8 kg  Height:        Intake/Output Summary (Last 24 hours) at 10/10/2019 1048 Last data filed at 10/10/2019 0200 Gross per 24 hour  Intake 941.7 ml  Output 600 ml  Net 341.7 ml   Filed Weights   10/08/19 2335 10/09/19 0300 10/10/19 0500  Weight: 61.8 kg 61.8 kg 61.8 kg    Examination:  General exam: NAD Respiratory system: CTA B.  No wheezes, no crackles, no rhonchi.  Normal respiratory effort. Cardiovascular system: Regular rate and rhythm no murmurs rubs or gallops.  No JVD.  No lower extremity edema.  Gastrointestinal system: Abdomen is soft, nontender, nondistended, positive bowel sounds.  No rebound.  No guarding.  Central nervous system: Alert and oriented. No focal neurological deficits. Extremities: Symmetric 5 x 5 power. Skin: No rashes, lesions or ulcers Psychiatry: Judgement and insight appear normal. Mood & affect appropriate.     Data Reviewed: I have personally reviewed following labs and imaging studies  CBC: Recent Labs  Lab 10/07/19 1539 10/08/19 0448 10/10/19 0423  WBC 3.9* 3.3* 4.3  NEUTROABS 3.0 2.4 3.5  HGB 6.4* 8.1* 8.3*  HCT 19.1* 24.7* 26.2*  MCV 95.0 94.6 95.6  PLT 224 190 [id number]   Basic Metabolic Panel: Recent Labs  Lab 10/07/19 1539 10/08/19 0448 10/10/19 0423  NA 133* 135 135  K 3.8 3.6 4.2  CL 102 106 105  CO2 23 22 22   GLUCOSE 108* 97 151*  BUN 12 11 11   CREATININE 0.65 0.69 0.63  CALCIUM 8.4* 7.7* 8.1*  MG  --   --  2.1   GFR: Estimated Creatinine Clearance: 55.5 mL/min (by C-G formula based on SCr of 0.63 mg/dL). Liver Function Tests: Recent Labs  Lab 10/07/19 1539 10/08/19 0448 10/10/19 0423  AST 64* 60* 50*  ALT 64* 56* 74*  ALKPHOS 107 102 118  BILITOT 3.2* 3.7* 1.3*  PROT 5.8* 5.6* 5.9*  ALBUMIN 3.3* 3.1* 3.3*   No results for input(s): LIPASE, AMYLASE in the last 168 hours. No results for input(s): AMMONIA in the last 168 hours. Coagulation Profile: No results for  input(s): INR, PROTIME in the last 168 hours. Cardiac Enzymes: No results for input(s): CKTOTAL, CKMB, CKMBINDEX, TROPONINI in the last 168 hours. BNP (last 3 results) No results for input(s): PROBNP in the last 8760 hours. HbA1C: No results for input(s): HGBA1C in the last 72 hours. CBG: Recent Labs  Lab 10/08/19 0850  GLUCAP 137*   Lipid Profile: No results for input(s): CHOL, HDL, LDLCALC, TRIG, CHOLHDL, LDLDIRECT in the last 72 hours. Thyroid Function Tests: No results for input(s): TSH, T4TOTAL, FREET4, T3FREE, THYROIDAB in the last 72 hours. Anemia Panel: Recent Labs    10/08/19 0448 10/10/19 0423  FERRITIN 939* 1,264*  TIBC 195*  --   IRON 59  --   RETICCTPCT 4.0* 3.5*   Sepsis Labs: No results for input(s): PROCALCITON, LATICACIDVEN in the last 168 hours.  Recent Results (from the past 240 hour(s))  Urine Culture     Status: Abnormal   Collection Time: 10/07/19  2:14 AM   Specimen: Urine, Random  Result Value Ref Range Status   Specimen Description URINE, RANDOM  Final   Special Requests NONE  Final  Culture (A)  Final    <10,000 COLONIES/mL INSIGNIFICANT GROWTH Performed at [facility] [address], [place], [place] [address]    Report Status 10/09/2019 FINAL  Final  SARS CORONAVIRUS 2 (TAT 6-24 HRS) Nasopharyngeal Nasopharyngeal Swab     Status: Abnormal   Collection Time: 10/07/19  4:30 PM   Specimen: Nasopharyngeal Swab  Result Value Ref Range Status   SARS Coronavirus 2 POSITIVE (A) NEGATIVE Final    Comment: RESULT CALLED TO, READ BACK BY AND VERIFIED WITH: W.WALLIS RN 2125 10/07/2019 MCCORMICK K (NOTE) SARS-CoV-2 target nucleic acids are DETECTED. The SARS-CoV-2 RNA is generally detectable in upper and lower respiratory specimens during the acute phase of infection. Positive results are indicative of active infection with SARS-CoV-2. Clinical  correlation with patient history and other diagnostic information is necessary to determine  patient infection status. Positive results do  not rule out bacterial infection or co-infection with other viruses. The expected result is Negative. Fact Sheet for Patients: SugarRoll.be Fact Sheet for Healthcare Providers: https://www.woods-mathews.com/ This test is not yet approved or cleared by the [place] FDA and  has been authorized for detection and/or diagnosis of SARS-CoV-2 by FDA under an Emergency Use Authorization (EUA). This EUA will remain  in effect (meaning this test can be used) f or the duration of the COVID-19 declaration under Section 564(b)(1) of the Act, 21 U.S.C. section 360bbb-3(b)(1), unless the authorization is terminated or revoked sooner. Performed at [facility], [place] [address] [place], [place] [address]          Radiology Studies: No results found.      Scheduled Meds:  sodium chloride   Intravenous Once   buPROPion  300 mg Oral q morning - 10a   docusate sodium  100 mg Oral BID   folic acid  2 mg Oral Daily   hydrochlorothiazide  12.5 mg Oral Daily   levothyroxine  88 mcg Oral QAC breakfast   methylPREDNISolone (SOLU-MEDROL) injection  60 mg Intravenous Q12H   senna  1 tablet Oral BID   Continuous Infusions:  sodium chloride 100 mL/hr at 10/09/19 1212   remdesivir 100 mg in NS 250 mL 100 mg (10/09/19 1220)     LOS: 3 days    Time spent: 35 minutes    Irine Seal, MD [facility]  If 7PM-7AM, please contact night-coverage [email or web address] 10/10/2019, 10:48 AM

## 2019-10-10 NOTE — Therapy (Signed)
[facility] [address] [place], [place], [address] Phone: [phone]   Fax:  [phone]  Patient Details  Name: SKARLETH DELMONICO MRN: [id number] Date of Birth: 1946-03-30 Referring Provider:  No ref. provider found  Encounter Date: 10/10/2019  PHYSICAL THERAPY DISCHARGE SUMMARY  Visits from Start of Care: 6  Current functional level related to goals / functional outcomes: Unable to assess; pt did not return for remainder 6 PT visits.  If pt has re-occurrence of dizziness, will require new physician referral.   Remaining deficits: Dizziness, imbalance   Education / Equipment: HEP  Plan: Patient agrees to discharge.  Patient goals were not met. Patient is being discharged due to not returning since the last visit.  ?????     Rico Junker, PT, DPT 10/10/19    2:41 PM    [facility] [address] [place], [place], [address] Phone: [phone]   Fax:  [phone]

## 2019-10-10 NOTE — Care Management Important Message (Signed)
Important Message  Patient Details IM Letter given to Rhea Pink SW to present to the Patient Name: [name] MRN: [id number] Date of Birth: 05-19-1946   Medicare Important Message Given:  Yes     Kerin Salen 10/10/2019, 11:44 AM

## 2019-10-11 ENCOUNTER — Encounter: Payer: Self-pay | Admitting: Hematology & Oncology

## 2019-10-11 ENCOUNTER — Other Ambulatory Visit: Payer: Self-pay | Admitting: Hematology & Oncology

## 2019-10-11 DIAGNOSIS — Z7189 Other specified counseling: Secondary | ICD-10-CM | POA: Insufficient documentation

## 2019-10-11 HISTORY — DX: Other specified counseling: Z71.89

## 2019-10-11 LAB — CBC WITH DIFFERENTIAL/PLATELET
Abs Immature Granulocytes: 0.23 10*3/uL — ABNORMAL HIGH (ref 0.00–0.07)
Basophils Absolute: 0 10*3/uL (ref 0.0–0.1)
Basophils Relative: 0 %
Eosinophils Absolute: 0 10*3/uL (ref 0.0–0.5)
Eosinophils Relative: 0 %
HCT: 26 % — ABNORMAL LOW (ref 36.0–46.0)
Hemoglobin: 8.7 g/dL — ABNORMAL LOW (ref 12.0–15.0)
Immature Granulocytes: 4 %
Lymphocytes Relative: 15 %
Lymphs Abs: 0.9 10*3/uL (ref 0.7–4.0)
MCH: 31.1 pg (ref 26.0–34.0)
MCHC: 33.5 g/dL (ref 30.0–36.0)
MCV: 92.9 fL (ref 80.0–100.0)
Monocytes Absolute: 0.5 10*3/uL (ref 0.1–1.0)
Monocytes Relative: 8 %
Neutro Abs: 4.5 10*3/uL (ref 1.7–7.7)
Neutrophils Relative %: 73 %
Platelets: 245 10*3/uL (ref 150–400)
RBC: 2.8 MIL/uL — ABNORMAL LOW (ref 3.87–5.11)
RDW: 15.9 % — ABNORMAL HIGH (ref 11.5–15.5)
WBC: 6.2 10*3/uL (ref 4.0–10.5)
nRBC: 0.3 % — ABNORMAL HIGH (ref 0.0–0.2)

## 2019-10-11 LAB — COMPREHENSIVE METABOLIC PANEL
ALT: 78 U/L — ABNORMAL HIGH (ref 0–44)
AST: 36 U/L (ref 15–41)
Albumin: 3.4 g/dL — ABNORMAL LOW (ref 3.5–5.0)
Alkaline Phosphatase: 118 U/L (ref 38–126)
Anion gap: 8 (ref 5–15)
BUN: 14 mg/dL (ref 8–23)
CO2: 24 mmol/L (ref 22–32)
Calcium: 8.5 mg/dL — ABNORMAL LOW (ref 8.9–10.3)
Chloride: 104 mmol/L (ref 98–111)
Creatinine, Ser: 0.64 mg/dL (ref 0.44–1.00)
GFR calc Af Amer: >60 mL/min (ref >60)
GFR calc non Af Amer: >60 mL/min (ref >60)
Glucose, Bld: 119 mg/dL — ABNORMAL HIGH (ref 70–99)
Potassium: 4.3 mmol/L (ref 3.5–5.1)
Sodium: 136 mmol/L (ref 135–145)
Total Bilirubin: 1.1 mg/dL (ref 0.3–1.2)
Total Protein: 5.9 g/dL — ABNORMAL LOW (ref 6.5–8.1)

## 2019-10-11 LAB — HEPATITIS B SURFACE ANTIGEN: Hepatitis B Surface Ag: NONREACTIVE

## 2019-10-11 LAB — C-REACTIVE PROTEIN: CRP: 1.3 mg/dL — ABNORMAL HIGH (ref <1.0)

## 2019-10-11 LAB — GLUCOSE, CAPILLARY: Glucose-Capillary: 125 mg/dL — ABNORMAL HIGH (ref 70–99)

## 2019-10-11 LAB — LACTATE DEHYDROGENASE: LDH: 291 U/L — ABNORMAL HIGH (ref 98–192)

## 2019-10-11 MED ORDER — METHYLPREDNISOLONE SODIUM SUCC 40 MG IJ SOLR
40.0000 mg | Freq: Every day | INTRAMUSCULAR | Status: DC
  Administered 2019-10-12: 40 mg via INTRAVENOUS
  Filled 2019-10-11: qty 1

## 2019-10-11 NOTE — Progress Notes (Signed)
START OFF PATHWAY REGIMEN - Lymphoma and CLL   [id number]:Bendamustine 90 mg/m2 IV D1,2 + Rituximab (IV/SUBQ) D1 q28 Days x 6 Cycles:   Cycle 1: A cycle is 28 days:     Rituximab-xxxx      Bendamustine    Cycles 2 through 6: A cycle is every 28 days:     Rituximab and hyaluronidase human      Bendamustine   **Always confirm dose/schedule in your pharmacy ordering system**  Patient Characteristics: Small Lymphocytic Lymphoma (SLL), Second Line Disease Type: Small Lymphocytic Lymphoma (SLL) Disease Type: Not Applicable Disease Type: Not Applicable [place] Stage: Unknown Line of Therapy: Second Line Intent of Therapy: Non-Curative / Palliative Intent, Discussed with Patient

## 2019-10-11 NOTE — Progress Notes (Signed)
Occupational Therapy Evaluation Patient Details Name: Christine [name] MRN: [id number] DOB: [birth year] Today's Date: 10/11/2019    History of Present Illness 73 y/o female w/ hx of Osteopenia, HTN, diverticulosis, depression, DVT of calf, cold agglutinin hemolytic anemia, arthritis, anxiety. L TKA,    Clinical Impression   Pt states she feels "much better". Educated pt on energy conservation and reducing risk of falls in addition to theraband HEP. Stressed importance of completing IS and Flutter valve for breathing exercises. Pt repeated each x 10. Pt very pleasat and appreciative. No further OT needs. Encourage ambulation.     Follow Up Recommendations  No OT follow up    Equipment Recommendations  None recommended by OT    Recommendations for Other Services       Precautions / Restrictions Precautions Precautions: Fall;Other (comment)(temperature affects condition) Restrictions Weight Bearing Restrictions: No Other Position/Activity Restrictions: temperature restrictions      Mobility Bed Mobility Overal bed mobility: Independent                Transfers Overall transfer level: Independent                    Balance Overall balance assessment: No apparent balance deficits (not formally assessed)                                         ADL either performed or assessed with clinical judgement   ADL Overall ADL's : At baseline                                       General ADL Comments: Pt able to complete basic ADL tasks. States she gets "very tired" Educated pt on energy conservation and reducing risk of falls.      Vision         Perception     Praxis      Pertinent Vitals/Pain Pain Assessment: No/denies pain     Hand Dominance Right   Extremity/Trunk Assessment Upper Extremity Assessment Upper Extremity Assessment: Generalized weakness   Lower Extremity Assessment Lower Extremity Assessment: Defer  to PT evaluation   Cervical / Trunk Assessment Cervical / Trunk Assessment: Normal   Communication Communication Communication: No difficulties   Cognition Arousal/Alertness: Awake/alert Behavior During Therapy: WFL for tasks assessed/performed Overall Cognitive Status: Within Functional Limits for tasks assessed                                     General Comments       Exercises Exercises: Other exercises Other Exercises Other Exercises: incentive spirometer x 10 - pulls 1250 ml Other Exercises: flutter valve x 10 Other Exercises: theraband HEP - level 2 . pt able to return demosntrate Other Exercises: Pt asking about core exercises. Discussed exercise and fatigue and dealing with chemo. Educated importance of continuing with consistent exercise adn if she had quesitons of what she should not b3e doing to discuss this with her oncologist   Shoulder Instructions      Home Living Family/patient expects to be discharged to:: Private residence Living Arrangements: Spouse/significant other Available Help at Discharge: Family Type of Home: Other(Comment)(condo with 2 stories) Home Access: Level entry     Home Layout:  Two level Alternate Level Stairs-Number of Steps: 14 Alternate Level Stairs-Rails: Can reach both Bathroom Shower/Tub: Occupational psychologist: Standard Bathroom Accessibility: Yes How Accessible: Accessible via walker Home Equipment: [name] in          Prior Functioning/Environment Level of Independence: Independent        Comments: 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        OT Problem List: Decreased strength;Decreased activity tolerance      OT Treatment/Interventions:      OT Goals(Current goals can be found in the care plan section) Acute Rehab OT Goals Patient Stated Goal: to go home and stay mobile OT Goal Formulation: All assessment and education complete, DC therapy   OT Frequency:     Barriers to D/C:            Co-evaluation              AM-PAC OT "6 Clicks" Daily Activity     Outcome Measure Help from another person eating meals?: None Help from another person taking care of personal grooming?: None Help from another person toileting, which includes using toliet, bedpan, or urinal?: None Help from another person bathing (including washing, rinsing, drying)?: None Help from another person to put on and taking off regular upper body clothing?: None Help from another person to put on and taking off regular lower body clothing?: None 6 Click Score: 24   End of Session Nurse Communication: Mobility status  Activity Tolerance: Patient tolerated treatment well Patient left: in chair;with call bell/phone within reach  OT Visit Diagnosis: Muscle weakness (generalized) (M62.81)                Time: [id number] OT Time Calculation (min): 22 min Charges:  OT General Charges $OT Visit: 1 Visit OT Evaluation $OT Eval Moderate Complexity: 1 Mod  Christine [name], OT/L   Acute OT Clinical Specialist Acute Rehabilitation Services Pager [phone] Office [phone]   [facility] 10/11/2019, 6:13 PM

## 2019-10-11 NOTE — Plan of Care (Signed)
Covid care plan initiated. Patient verbalizes understanding of masking and treatment plan.

## 2019-10-11 NOTE — Evaluation (Addendum)
Physical Therapy Evaluation Patient Details Name: Christine [name] MRN: [id number] DOB: January 14, 1946 Today's Date: 10/11/2019   History of Present Illness  73 y/o female w/ hx of Osteopenia, HTN, diverticulosis, depression, DVT of calf, cold agglutinin hemolytic anemia, arthritis, anxiety. L TKA,   Clinical Impression   Pt admitted with above diagnosis. 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  Pt currently with functional limitations due to the deficits listed below (see PT Problem List). This am pt denies dizziness and is able to complete assessment on room air and maintain sats in 90s throughout. Pt will benefit from skilled PT to increase their independence and safety with mobility to allow discharge to the venue listed below.       Follow Up Recommendations No PT follow up    Equipment Recommendations  None recommended by PT    Recommendations for Other Services OT consult     Precautions / Restrictions Precautions Precautions: Fall;Other (comment)(temperature affects condition) Restrictions Weight Bearing Restrictions: No Other Position/Activity Restrictions: temperature restrictions      Mobility  Bed Mobility Overal bed mobility: Modified Independent             General bed mobility comments: found in bed and is agreeable to assessment  Transfers Overall transfer level: Modified independent Equipment used: None             General transfer comment: does well with transfer sit<>stand from multi levels  Ambulation/Gait Ambulation/Gait assistance: Supervision Gait Distance (Feet): 200 Feet Assistive device: None Gait Pattern/deviations: Step-through pattern(slight balance deficits noted) Gait velocity: slow   General Gait Details: did well with ambiulation, able to maintain sats in 90s on room air. some minor balance defciits at start but does better by end  Stairs            Wheelchair Mobility    Modified Rankin (Stroke Patients  Only)       Balance Overall balance assessment: Needs assistance   Sitting balance-Leahy Scale: Normal     Standing balance support: During functional activity Standing balance-Leahy Scale: Fair                               Pertinent Vitals/Pain Pain Assessment: No/denies pain    Home Living Family/patient expects to be discharged to:: (P) Private residencelives home with spouse in [place], is able to live down stairs and bathroom is accessible but main bedroom is upstaits (flight of 14 steps) with B rails. 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              Hand Dominance        Extremity/Trunk Assessment   Upper Extremity Assessment Upper Extremity Assessment: (P) Defer to OT evaluationDefer to OT    Lower Extremity Assessment Lower Extremity Assessment: (P) Generalized weakness    Cervical / Trunk Assessment Cervical / Trunk Assessment: (P) Normal  Communication   Communication: (P) No difficultiesNo deficits  Cognition Arousal/Alertness: Awake/alert Behavior During Therapy: WFL for tasks assessed/performed Overall Cognitive Status: Within Functional Limits for tasks assessed  General Comments General comments (skin integrity, edema, etc.): Pt admitted with symptomatic anemia found to also have COVID +, pt was transfused  2 units and this am states is feeling much better. Pt denies any dizziness, states that she went through vestibular therapy and was done approx 6 months ago. This am is at SBA- mod I with functional mobility and is on room air, able to maintain sats in 90s throughout.    Exercises     Assessment/Plan     PT Assessment (P) Patient needs continued PT services  PT Problem List  decreased balance and coordination, activity tolerance, independence with functional mobility.       PT Treatment Interventions      PT Goals (Current goals can be found in the Care Plan section)  Acute Rehab PT Goals Patient Stated Goal: to go home and stay mobile    Frequency Min 3X/week   Barriers to discharge        Co-evaluation               AM-PAC PT "6 Clicks" Mobility  Outcome Measure Help needed turning from your back to your side while in a flat bed without using bedrails?: None Help needed moving from lying on your back to sitting on the side of a flat bed without using bedrails?: None Help needed moving to and from a bed to a chair (including a wheelchair)?: None Help needed standing up from a chair using your arms (e.g., wheelchair or bedside chair)?: None Help needed to walk in hospital room?: A Little Help needed climbing 3-5 steps with a railing? : A Little 6 Click Score: 22    End of Session   Activity Tolerance: Patient tolerated treatment well Patient left: in chair;with call bell/phone within reach Nurse Communication: Mobility status PT Visit Diagnosis: Unsteadiness on feet (R26.81);Muscle weakness (generalized) (M62.81)    Time: [id number] PT Time Calculation (min) (ACUTE ONLY): 18 min   Charges:   PT Evaluation $PT Eval Low Complexity: [place], PT   Delford Field 10/11/2019, 2:17 PM

## 2019-10-11 NOTE — Progress Notes (Signed)
PROGRESS NOTE                                                                                                                                                                                                             Patient Demographics:    Christine [name], is a 73 y.o. female, DOB - Sep 02, 1946, RXV:[id number]  Outpatient Primary MD for the patient is Marton Redwood, MD    LOS - 4  Admit date - 10/07/2019    Chief Complaint  Patient presents with   Dizziness   Weakness       Brief Narrative  73 year old female with history of cold agglutinin hemolytic anemia, DVT, depression, status post 1 week of Rituxan completed in 2015, followed by Dr. Marin Olp presented on 10/07/2019 with worsening weakness, low energy and lightheadedness. She went to her PCP'soffice on 10/07/2019 and was found to have low hemoglobin along with orthostatic hypotension and subsequently referred to ED. She was found to have hemoglobin of 6.4. Chest x-ray was negative for acute cardiopulmonary disease. She was transfused 2 unit packed red cells. Oncology/Dr. Marin Olp was consulted who recommended transfer to [facility].     Subjective:    Darcel Smalling today has, No headache, No chest pain, No abdominal pain - No Nausea, No new weakness tingling or numbness, no Cough - SOB.    Assessment  & Plan :     1. Acute Covid 19 Viral Pneumonitis during the ongoing 2020 Covid 19 Pandemic - stable from the standpoint, finishing her IV steroids and IV remdesivir course.  On room air and stable.  Stable inflammatory markers.   COVID-19 Labs  Recent Labs    10/10/19 0423 10/11/19 0808  FERRITIN 1,264*  --   LDH 313* 291*  CRP 2.8* 1.3*    Lab Results  Component Value Date   SARSCOV2NAA POSITIVE (A) 10/07/2019     SpO2: 98 %  Hepatic Function Latest Ref Rng & Units 10/11/2019 10/10/2019 10/08/2019  Total Protein 6.5 - 8.1 g/dL  5.9(L) 5.9(L) 5.6(L)  Albumin 3.5 - 5.0 g/dL 3.4(L) 3.3(L) 3.1(L)  AST 15 - 41 U/L 36 50(H) 60(H)  ALT 0 - 44 U/L 78(H) 74(H) 56(H)  Alk Phosphatase 38 - 126 U/L 118 118 102  Total Bilirubin 0.3 - 1.2 mg/dL 1.1 1.3(H) 3.7(H)  Bilirubin, Direct 0.0 - 0.3  mg/dL - - -      2.  Acute symptomatic hemolytic anemia in a patient with underlying history of cold agglutinin hemolytic anemia - S/p 2 units of packed RBC transfusion, currently on IV steroids, continue, H&H will be monitored closely which appears currently stable.  Once discharged will follow with Dr. Marin Olp  3.  Hypertension.  Blood pressure stable on HCTZ.  4.  Mild viral transaminitis due to COVID-19.  Stable.  Follow trend.  5.  Left subclavian artery hemodynamically significant stenosis.  Likely chronic.  Case was discussed by previous physician with Dr. Scot Dock vascular surgery who would like to follow in the outpatient setting.  6.  Hypothyroidism.  On Synthroid.    Condition - Fair  Family Communication  :  None  Code Status :  Full  Diet :   Diet Order            Diet Heart Room service appropriate? Yes; Fluid consistency: Thin  Diet effective now               Disposition Plan  :  Home 1-2 days  Consults  :  Haem -Onc  Procedures  :    CT Chest-A&P -  IMPRESSION: 1. No evidence of recurrent lymphoma in the chest, abdomen or pelvis. 2. Scattered ground-glass airspace opacities throughout the UPPER lobes bilaterally. There are a spectrum of findings in the lungs which can be seen with acute atypical infection (as well as other non-infectious etiologies). In particular, viral pneumonia (including COVID-19) should be considered in the appropriate clinical setting. 3. No acute cardiopulmonary disease otherwise. 4. Ectatic ascending thoracic aorta measuring up to 4.1 cm diameter, only minimally increased in size since 2015 where it measured 3.9 cm. Recommend annual imaging followup by CTA or MRA. This  recommendation follows 2010 ACCF/AHA/AATS/ACR/ASA/SCA/SCAI/SIR/STS/SVM Guidelines for the Diagnosis and Management of Patients with Thoracic Aortic Disease. Circulation. 2010; 121: G500-B704. 5. Possible hemodynamically significant stenosis involving the origin of the LEFT subclavian artery. 6. Stable moderate splenomegaly. 7. Descending and sigmoid colon diverticulosis without evidence of acute diverticulitis. Aortic Atherosclerosis  PUD Prophylaxis :    DVT Prophylaxis  :  SCDs    Lab Results  Component Value Date   PLT 221 10/10/2019    Inpatient Medications  Scheduled Meds:  sodium chloride   Intravenous Once   buPROPion  300 mg Oral q morning - 88Q   folic acid  2 mg Oral Daily   hydrochlorothiazide  12.5 mg Oral Daily   levothyroxine  88 mcg Oral QAC breakfast   [START ON 10/12/2019] methylPREDNISolone (SOLU-MEDROL) injection  40 mg Intravenous Daily   Continuous Infusions:  sodium chloride 100 mL/hr at 10/09/19 1212   remdesivir 100 mg in NS 250 mL 100 mg (10/11/19 0947)   PRN Meds:.acetaminophen, bisacodyl, morphine injection, ondansetron **OR** ondansetron (ZOFRAN) IV, senna, traMADol, zolpidem  Antibiotics  :    Anti-infectives (From admission, onward)   Start     Dose/Rate Route Frequency Ordered Stop   10/09/19 1000  remdesivir 100 mg in sodium chloride 0.9 % 250 mL IVPB     100 mg 500 mL/hr over 30 Minutes Intravenous Every 24 hours 10/08/19 0844 10/13/19 0959   10/08/19 1000  remdesivir 200 mg in sodium chloride 0.9 % 250 mL IVPB     200 mg 500 mL/hr over 30 Minutes Intravenous Once 10/08/19 0844 10/08/19 1037       Time Spent in minutes  30   Lala Lund M.D on  10/11/2019 at 10:17 AM  To page go to [email or web address] - password [place]  [facility] -  Office  [phone]    See all Orders from today for further details    Objective:   Vitals:   10/10/19 1700 10/10/19 1935 10/11/19 0500 10/11/19 0715  BP: (!) 142/66 115/89 (!) 143/73  (!) 143/70  Pulse: 69 65  (!) 58  Resp:  _0 Temp: (!) 96.3 F (35.7 C) 98.2 F (36.8 C) 98.3 F (36.8 C) 98.1 F (36.7 C)  TempSrc: Oral Oral Oral Oral  SpO2: 98% 100%  98%  Weight:      Height:        Wt Readings from Last 3 Encounters:  10/10/19 61.8 kg  04/16/19 60.3 kg  02/21/17 61 kg     Intake/Output Summary (Last 24 hours) at 10/11/2019 1017 Last data filed at 10/10/2019 1422 Gross per 24 hour  Intake 250 ml  Output 1001 ml  Net -751 ml     Physical Exam  Awake Alert, Oriented X 3, No new F.N deficits, Normal affect [place].AT,PERRAL Supple Neck,No JVD, No cervical lymphadenopathy appriciated.  Symmetrical Chest wall movement, Good air movement bilaterally, CTAB RRR,No Gallops,Rubs or new Murmurs, No Parasternal Heave +ve B.Sounds, Abd Soft, No tenderness, No organomegaly appriciated, No rebound - guarding or rigidity. No Cyanosis, Clubbing or edema, No new Rash or bruise      Data Review:    CBC Recent Labs  Lab 10/07/19 1539 10/08/19 0448 10/10/19 0423  WBC 3.9* 3.3* 4.3  HGB 6.4* 8.1* 8.3*  HCT 19.1* 24.7* 26.2*  PLT 224 190 221  MCV 95.0 94.6 95.6  MCH 31.8 31.0 30.3  MCHC 33.5 32.8 31.7  RDW 15.8* 15.8* 16.2*  LYMPHSABS 0.6* 0.7 0.6*  MONOABS 0.3 0.2 0.2  EOSABS 0.0 0.0 0.0  BASOSABS 0.0 0.0 0.0    Chemistries  Recent Labs  Lab 10/07/19 1539 10/08/19 0448 10/10/19 0423 10/11/19 0808  NA 133* 135 135 136  K 3.8 3.6 4.2 4.3  CL 102 106 105 104  CO2 _1 GLUCOSE 108* 97 151* 119*  BUN _2 CREATININE 0.65 0.69 0.63 0.64  CALCIUM 8.4* 7.7* 8.1* 8.5*  MG  --   --  2.1  --   AST 64* 60* 50* 36  ALT 64* 56* 74* 78*  ALKPHOS 107 102 118 118  BILITOT 3.2* 3.7* 1.3* 1.1   ------------------------------------------------------------------------------------------------------------------ No results for input(s): CHOL, HDL, LDLCALC, TRIG, CHOLHDL, LDLDIRECT in the last 72 hours.  No results found for:  HGBA1C ------------------------------------------------------------------------------------------------------------------ No results for input(s): TSH, T4TOTAL, T3FREE, THYROIDAB in the last 72 hours.  Invalid input(s): FREET3  Cardiac Enzymes No results for input(s): CKMB, TROPONINI, MYOGLOBIN in the last 168 hours.  Invalid input(s): CK ------------------------------------------------------------------------------------------------------------------ No results found for: BNP  Micro Results Recent Results (from the past 240 hour(s))  Urine Culture     Status: Abnormal   Collection Time: 10/07/19  2:14 AM   Specimen: Urine, Random  Result Value Ref Range Status   Specimen Description URINE, RANDOM  Final   Special Requests NONE  Final   Culture (A)  Final    <10,000 COLONIES/mL INSIGNIFICANT GROWTH Performed at [facility], [address] [place], [place] [address]    Report Status 10/09/2019 FINAL  Final  SARS CORONAVIRUS 2 (TAT 6-24 HRS) Nasopharyngeal Nasopharyngeal Swab     Status: Abnormal   Collection Time: 10/07/19  4:30  PM   Specimen: Nasopharyngeal Swab  Result Value Ref Range Status   SARS Coronavirus 2 POSITIVE (A) NEGATIVE Final    Comment: RESULT CALLED TO, READ BACK BY AND VERIFIED WITH: W.WALLIS RN 2125 10/07/2019 MCCORMICK K (NOTE) SARS-CoV-2 target nucleic acids are DETECTED. The SARS-CoV-2 RNA is generally detectable in upper and lower respiratory specimens during the acute phase of infection. Positive results are indicative of active infection with SARS-CoV-2. Clinical  correlation with patient history and other diagnostic information is necessary to determine patient infection status. Positive results do  not rule out bacterial infection or co-infection with other viruses. The expected result is Negative. Fact Sheet for Patients: SugarRoll.be Fact Sheet for Healthcare  Providers: https://www.woods-mathews.com/ This test is not yet approved or cleared by the [place] FDA and  has been authorized for detection and/or diagnosis of SARS-CoV-2 by FDA under an Emergency Use Authorization (EUA). This EUA will remain  in effect (meaning this test can be used) f or the duration of the COVID-19 declaration under Section 564(b)(1) of the Act, 21 U.S.C. section 360bbb-3(b)(1), unless the authorization is terminated or revoked sooner. Performed at [facility], [place] [address] [place], [place] [address]     Radiology Reports Ct Chest W Contrast  Result Date: 10/07/2019 CLINICAL DATA:  73 year old with a lymphoproliferative disorder and recurrent cold agglutinin hemolytic anemia. Patient underwent Rituxan therapy in 2015. She has current symptoms of generalized weakness, fatigue and orthostasis. EXAM: CT CHEST, ABDOMEN, AND PELVIS WITH CONTRAST TECHNIQUE: Multidetector CT imaging of the chest, abdomen and pelvis was performed following the standard protocol during bolus administration of intravenous contrast. CONTRAST:  165m OMNIPAQUE IOHEXOL 300 MG/ML IV. COMPARISON:  02/13/2014. FINDINGS: CT CHEST FINDINGS Cardiovascular: Normal heart size. Severe three-vessel coronary atherosclerosis. RIGHT coronary artery stent. No pericardial effusion. Moderate atherosclerosis involving the thoracic aorta. Ectatic ascending thoracic aorta measuring approximately 4.1 cm diameter, only minimally increased in size since 2015 where it measured 3.9 cm. Atherosclerosis at the origin of the LEFT subclavian artery with possible hemodynamically significant stenosis. Mediastinum/Nodes: No pathologically enlarged mediastinal, hilar or axillary lymph nodes. No mediastinal masses. Normal-appearing esophagus. Normal-appearing thyroid gland. Lungs/Pleura: Scattered ground-glass airspace opacities throughout the UPPER lobes bilaterally. Blebs or air cysts in the RIGHT MIDDLE LOBE  and RIGHT LOWER LOBE. Expected mild dependent atelectasis posteriorly in the lower lobes. No parenchymal nodules or masses. No pleural effusions. Central airways patent without significant bronchial wall thickening. Musculoskeletal: Degenerative disc disease and spondylosis involving the visualized lower cervical spine. Mild osseous demineralization. No acute or significant abnormalities otherwise involving the thoracic spine. CT ABDOMEN PELVIS FINDINGS Hepatobiliary: Liver normal in size and appearance. Surgically absent gallbladder. No unexpected biliary ductal dilation. Pancreas: Normal in appearance without evidence of mass, ductal dilation, or inflammation. Spleen: Moderate splenomegaly, unchanged since the 2015 CT. No focal splenic parenchymal abnormality. Adrenals/Urinary Tract: Normal appearing adrenal glands. Scarring involving the LOWER pole of the RIGHT kidney, new since the 2015 CT. No renal masses. No hydronephrosis. No urinary tract calculi. Normal appearing urinary bladder. Stomach/Bowel: Stomach normal in appearance for the degree of distention. Normal-appearing small bowel. Elongated transverse colon which extends low into the pelvis prior to extending back upward to the splenic flexure. Descending and sigmoid colon diverticulosis, extensive in the sigmoid region, without evidence of acute diverticulitis. Normal appendix in the RIGHT mid pelvis. Vascular/Lymphatic: Moderate aorto-iliofemoral atherosclerosis without evidence of aneurysm. Normal-appearing portal venous and systemic venous systems. No pathologic lymphadenopathy. Reproductive: Surgically absent uterus. No adnexal masses. Other: None.  Musculoskeletal: Degenerative disc disease at L1-2, L4-5 and L5-S1. No acute findings. IMPRESSION: 1. No evidence of recurrent lymphoma in the chest, abdomen or pelvis. 2. Scattered ground-glass airspace opacities throughout the UPPER lobes bilaterally. There are a spectrum of findings in the lungs which  can be seen with acute atypical infection (as well as other non-infectious etiologies). In particular, viral pneumonia (including COVID-19) should be considered in the appropriate clinical setting. 3. No acute cardiopulmonary disease otherwise. 4. Ectatic ascending thoracic aorta measuring up to 4.1 cm diameter, only minimally increased in size since 2015 where it measured 3.9 cm. Recommend annual imaging followup by CTA or MRA. This recommendation follows 2010 ACCF/AHA/AATS/ACR/ASA/SCA/SCAI/SIR/STS/SVM Guidelines for the Diagnosis and Management of Patients with Thoracic Aortic Disease. Circulation. 2010; 121: E423-N361. 5. Possible hemodynamically significant stenosis involving the origin of the LEFT subclavian artery. 6. Stable moderate splenomegaly. 7. Descending and sigmoid colon diverticulosis without evidence of acute diverticulitis. Aortic Atherosclerosis (ICD10-I70.0). Aortic aneurysm NOS (ICD10-I71.9) Electronically Signed   By: Evangeline Dakin M.D.   On: 10/07/2019 20:17   Ct Abdomen Pelvis W Contrast  Result Date: 10/07/2019 CLINICAL DATA:  73 year old with a lymphoproliferative disorder and recurrent cold agglutinin hemolytic anemia. Patient underwent Rituxan therapy in 2015. She has current symptoms of generalized weakness, fatigue and orthostasis. EXAM: CT CHEST, ABDOMEN, AND PELVIS WITH CONTRAST TECHNIQUE: Multidetector CT imaging of the chest, abdomen and pelvis was performed following the standard protocol during bolus administration of intravenous contrast. CONTRAST:  169m OMNIPAQUE IOHEXOL 300 MG/ML IV. COMPARISON:  02/13/2014. FINDINGS: CT CHEST FINDINGS Cardiovascular: Normal heart size. Severe three-vessel coronary atherosclerosis. RIGHT coronary artery stent. No pericardial effusion. Moderate atherosclerosis involving the thoracic aorta. Ectatic ascending thoracic aorta measuring approximately 4.1 cm diameter, only minimally increased in size since 2015 where it measured 3.9 cm.  Atherosclerosis at the origin of the LEFT subclavian artery with possible hemodynamically significant stenosis. Mediastinum/Nodes: No pathologically enlarged mediastinal, hilar or axillary lymph nodes. No mediastinal masses. Normal-appearing esophagus. Normal-appearing thyroid gland. Lungs/Pleura: Scattered ground-glass airspace opacities throughout the UPPER lobes bilaterally. Blebs or air cysts in the RIGHT MIDDLE LOBE and RIGHT LOWER LOBE. Expected mild dependent atelectasis posteriorly in the lower lobes. No parenchymal nodules or masses. No pleural effusions. Central airways patent without significant bronchial wall thickening. Musculoskeletal: Degenerative disc disease and spondylosis involving the visualized lower cervical spine. Mild osseous demineralization. No acute or significant abnormalities otherwise involving the thoracic spine. CT ABDOMEN PELVIS FINDINGS Hepatobiliary: Liver normal in size and appearance. Surgically absent gallbladder. No unexpected biliary ductal dilation. Pancreas: Normal in appearance without evidence of mass, ductal dilation, or inflammation. Spleen: Moderate splenomegaly, unchanged since the 2015 CT. No focal splenic parenchymal abnormality. Adrenals/Urinary Tract: Normal appearing adrenal glands. Scarring involving the LOWER pole of the RIGHT kidney, new since the 2015 CT. No renal masses. No hydronephrosis. No urinary tract calculi. Normal appearing urinary bladder. Stomach/Bowel: Stomach normal in appearance for the degree of distention. Normal-appearing small bowel. Elongated transverse colon which extends low into the pelvis prior to extending back upward to the splenic flexure. Descending and sigmoid colon diverticulosis, extensive in the sigmoid region, without evidence of acute diverticulitis. Normal appendix in the RIGHT mid pelvis. Vascular/Lymphatic: Moderate aorto-iliofemoral atherosclerosis without evidence of aneurysm. Normal-appearing portal venous and systemic  venous systems. No pathologic lymphadenopathy. Reproductive: Surgically absent uterus. No adnexal masses. Other: None. Musculoskeletal: Degenerative disc disease at L1-2, L4-5 and L5-S1. No acute findings. IMPRESSION: 1. No evidence of recurrent lymphoma in the chest, abdomen or pelvis. 2. Scattered ground-glass  airspace opacities throughout the UPPER lobes bilaterally. There are a spectrum of findings in the lungs which can be seen with acute atypical infection (as well as other non-infectious etiologies). In particular, viral pneumonia (including COVID-19) should be considered in the appropriate clinical setting. 3. No acute cardiopulmonary disease otherwise. 4. Ectatic ascending thoracic aorta measuring up to 4.1 cm diameter, only minimally increased in size since 2015 where it measured 3.9 cm. Recommend annual imaging followup by CTA or MRA. This recommendation follows 2010 ACCF/AHA/AATS/ACR/ASA/SCA/SCAI/SIR/STS/SVM Guidelines for the Diagnosis and Management of Patients with Thoracic Aortic Disease. Circulation. 2010; 121: H209-Z980. 5. Possible hemodynamically significant stenosis involving the origin of the LEFT subclavian artery. 6. Stable moderate splenomegaly. 7. Descending and sigmoid colon diverticulosis without evidence of acute diverticulitis. Aortic Atherosclerosis (ICD10-I70.0). Aortic aneurysm NOS (ICD10-I71.9) Electronically Signed   By: Evangeline Dakin M.D.   On: 10/07/2019 20:17   Dg Chest Portable 1 View  Result Date: 10/07/2019 CLINICAL DATA:  Cough, anemia, weakness EXAM: PORTABLE CHEST 1 VIEW COMPARISON:  Radiograph 11/16/2007 FINDINGS: No consolidation, features of edema, pneumothorax, or effusion. Pulmonary vascularity is normally distributed. The cardiomediastinal contours are unremarkable. No acute osseous or soft tissue abnormality. Cardiac monitoring leads overlie the chest. Vascular calcium in the left neck. IMPRESSION: No acute cardiopulmonary abnormality. Electronically Signed    By: Lovena Le M.D.   On: 10/07/2019 16:30

## 2019-10-12 LAB — CBC WITH DIFFERENTIAL/PLATELET
Abs Immature Granulocytes: 0.72 10*3/uL — ABNORMAL HIGH (ref 0.00–0.07)
Basophils Absolute: 0 10*3/uL (ref 0.0–0.1)
Basophils Relative: 0 %
Eosinophils Absolute: 0 10*3/uL (ref 0.0–0.5)
Eosinophils Relative: 0 %
HCT: 26.7 % — ABNORMAL LOW (ref 36.0–46.0)
Hemoglobin: 8.4 g/dL — ABNORMAL LOW (ref 12.0–15.0)
Immature Granulocytes: 8 %
Lymphocytes Relative: 19 %
Lymphs Abs: 1.8 10*3/uL (ref 0.7–4.0)
MCH: 30.1 pg (ref 26.0–34.0)
MCHC: 31.5 g/dL (ref 30.0–36.0)
MCV: 95.7 fL (ref 80.0–100.0)
Monocytes Absolute: 0.9 10*3/uL (ref 0.1–1.0)
Monocytes Relative: 9 %
Neutro Abs: 6.3 10*3/uL (ref 1.7–7.7)
Neutrophils Relative %: 64 %
Platelets: 288 10*3/uL (ref 150–400)
RBC: 2.79 MIL/uL — ABNORMAL LOW (ref 3.87–5.11)
RDW: 16.2 % — ABNORMAL HIGH (ref 11.5–15.5)
WBC: 9.6 10*3/uL (ref 4.0–10.5)
nRBC: 1.3 % — ABNORMAL HIGH (ref 0.0–0.2)

## 2019-10-12 LAB — COMPREHENSIVE METABOLIC PANEL
ALT: 74 U/L — ABNORMAL HIGH (ref 0–44)
AST: 29 U/L (ref 15–41)
Albumin: 3.6 g/dL (ref 3.5–5.0)
Alkaline Phosphatase: 111 U/L (ref 38–126)
Anion gap: 10 (ref 5–15)
BUN: 17 mg/dL (ref 8–23)
CO2: 24 mmol/L (ref 22–32)
Calcium: 9 mg/dL (ref 8.9–10.3)
Chloride: 100 mmol/L (ref 98–111)
Creatinine, Ser: 0.64 mg/dL (ref 0.44–1.00)
GFR calc Af Amer: >60 mL/min (ref >60)
GFR calc non Af Amer: >60 mL/min (ref >60)
Glucose, Bld: 103 mg/dL — ABNORMAL HIGH (ref 70–99)
Potassium: 3.7 mmol/L (ref 3.5–5.1)
Sodium: 134 mmol/L — ABNORMAL LOW (ref 135–145)
Total Bilirubin: 1.4 mg/dL — ABNORMAL HIGH (ref 0.3–1.2)
Total Protein: 6.1 g/dL — ABNORMAL LOW (ref 6.5–8.1)

## 2019-10-12 LAB — MAGNESIUM: Magnesium: 2 mg/dL (ref 1.7–2.4)

## 2019-10-12 LAB — RETICULOCYTES
Immature Retic Fract: 35.5 % — ABNORMAL HIGH (ref 2.3–15.9)
RBC.: 2.79 MIL/uL — ABNORMAL LOW (ref 3.87–5.11)
Retic Count, Absolute: 135.6 10*3/uL (ref 19.0–186.0)
Retic Ct Pct: 4.9 % — ABNORMAL HIGH (ref 0.4–3.1)

## 2019-10-12 LAB — C-REACTIVE PROTEIN: CRP: 1.3 mg/dL — ABNORMAL HIGH (ref <1.0)

## 2019-10-12 LAB — LACTATE DEHYDROGENASE: LDH: 265 U/L — ABNORMAL HIGH (ref 98–192)

## 2019-10-12 LAB — BRAIN NATRIURETIC PEPTIDE: B Natriuretic Peptide: 92.3 pg/mL (ref 0.0–100.0)

## 2019-10-12 MED ORDER — PREDNISONE 5 MG PO TABS: ORAL_TABLET | ORAL | 0 refills | Status: DC

## 2019-10-12 NOTE — Discharge Summary (Signed)
°                                                                                ° °Christine [name] MRN:[id number] DOB: 06/25/1946 DOA: 10/07/2019 ° °PCP: Shaw, William, MD ° °Admit date: 10/07/2019  Discharge date: 10/12/2019 ° °Admitted From: Home  Disposition:  Home ° ° °Recommendations for Outpatient Follow-up:  ° °Follow up with PCP in 1-2 weeks ° °PCP Please obtain BMP/CBC, 2 view CXR in 1week,  (see Discharge instructions)  ° °PCP Please follow up on the following pending results: Review and follow up on CT report. Check CBC in 1 week. ° ° °Home Health: None   °Equipment/Devices: None  °Consultations: Onc °Discharge Condition: Stable  °CODE STATUS: Full    °Diet Recommendation: Heart Healthy  ° °  ° °Chief Complaint  °Patient presents with  °• Dizziness  °• Weakness  °  ° °Brief history of present illness from the day of admission and additional interim summary   ° °73-year-old female with history of cold agglutinin hemolytic anemia, DVT, depression, status post 1 week of Rituxan completed in 2015, followed by Dr. Ennever presented on 10/07/2019 with worsening weakness, low energy and lightheadedness.  She went to her PCP's office on 10/07/2019 and was found to have low hemoglobin along with orthostatic hypotension and subsequently referred to ED.  She was found to have hemoglobin of 6.4.  Chest x-ray was negative for acute cardiopulmonary disease.  She was transfused 2 unit packed red cells.  Oncology/Dr. Ennever was consulted who recommended transfer to [facility].    ° °                                                               Hospital Course  ° °  °1. Acute Covid 19 Viral Pneumonitis during the ongoing 2020 Covid 19 Pandemic - stable from the standpoint, placed on IV steroids and IV remdesivir course.  Will finish her Remdesivir today, remains symptom free on room air and will be DC'd on PO steroid  taper today with PCP follow up.  Stable inflammatory markers. ° °COVID-19 Labs ° °Recent Labs  °  10/10/19 °0423 10/11/19 °0808 10/12/19 °0253  °FERRITIN 1,264*  --   --   °LDH 313* 291* 265*  °CRP 2.8* 1.3* 1.3*  ° ° °Lab Results  °Component Value Date  ° SARSCOV2NAA POSITIVE (A) 10/07/2019  ° °Hepatic Function Latest Ref Rng & Units 10/12/2019 10/11/2019 10/10/2019  °Total Protein 6.5 - 8.1 g/dL 6.1(L) 5.9(L) 5.9(L)  °Albumin 3.5 - 5.0 g/dL 3.6 3.4(L) 3.3(L)  °AST 15 - 41 U/L 29 36 50(H)  °ALT 0 - 44 U/L 74(H) 78(H) 74(H)  °Alk Phosphatase 38 - 126 U/L 111 118 118  °Total Bilirubin 0.3 - 1.2 mg/dL 1.4(H) 1.1 1.3(H)  °Bilirubin, Direct 0.0 - 0.3 mg/dL - - -  ° ° °  °2.  Acute symptomatic hemolytic anemia in a patient with underlying history of cold agglutinin hemolytic anemia - S/p 2 units   of packed RBC transfusion, currently on IV steroids, continue, H&H will be monitored closely which appears currently stable.  Once discharged will follow with Dr. Ennever, case DW Dr Ennever 10/11/19. °  °3.  Hypertension.  Blood pressure stable on HCTZ. °  °4.  Mild viral transaminitis due to COVID-19.  Stable.  Follow trend. °  °5.  Left subclavian artery hemodynamically significant stenosis.  Likely chronic.  Case was discussed by previous physician with Dr. Dickson vascular surgery who would like to follow in the outpatient setting. °  °6.  Hypothyroidism.  On Synthroid. ° °Discharge diagnosis   ° ° °Principal Problem: °  Symptomatic anemia °Active Problems: °  Anemia °  ANXIETY DEPRESSION °  Essential hypertension °  Cold agglutinin disease °  Transaminasemia °  COVID-19 virus infection °  Hypothyroidism ° ° ° °Discharge instructions   ° °Discharge Instructions   ° Diet - low sodium heart healthy   Complete by: As directed °  ° Discharge instructions   Complete by: As directed °  ° Discuss your CT report with your PCP next visit. ° °Follow with Primary MD Shaw, William, MD in 7 days  ° °Get CBC, CMP, 2 view Chest X ray -   checked next visit within 1 week by Primary MD   ° °Activity: As tolerated with Full fall precautions use walker/cane & assistance as needed ° °Disposition Home   ° °Diet: Heart Healthy   ° °Special Instructions: If you have smoked or chewed Tobacco  in the last 2 yrs please stop smoking, stop any regular Alcohol  and or any Recreational drug use. ° °On your next visit with your primary care physician please Get Medicines reviewed and adjusted. ° °Please request your Prim.MD to go over all Hospital Tests and Procedure/Radiological results at the follow up, please get all Hospital records sent to your Prim MD by signing hospital release before you go home. ° °If you experience worsening of your admission symptoms, develop shortness of breath, life threatening emergency, suicidal or homicidal thoughts you must seek medical attention immediately by calling 911 or calling your MD immediately  if symptoms less severe. ° °You Must read complete instructions/literature along with all the possible adverse reactions/side effects for all the Medicines you take and that have been prescribed to you. Take any new Medicines after you have completely understood and accpet all the possible adverse reactions/side effects.  ° Increase activity slowly   Complete by: As directed °  °  ° ° °Discharge Medications  ° °Allergies as of 10/12/2019   °   Reactions  ° Bee Venom Anaphylaxis, Shortness Of Breath  °  °  °Medication List  °  °STOP taking these medications   °cephALEXin 500 MG capsule °Commonly known as: KEFLEX °  °ciprofloxacin 500 MG tablet °Commonly known as: CIPRO °  °  °TAKE these medications   °acetaminophen 500 MG tablet °Commonly known as: TYLENOL °Take 500-1,000 mg by mouth every 6 (six) hours as needed for mild pain or headache. °  °amoxicillin 500 MG capsule °Commonly known as: AMOXIL °Take 2,000 mg by mouth See admin instructions. Take 2,000 mg by mouth one hour before dental appointments °  °BIOTIN PO °Take 1 tablet by  mouth daily with breakfast. °  °buPROPion 300 MG 24 hr tablet °Commonly known as: WELLBUTRIN XL °Take 300 mg by mouth every morning. °  °EpiPen 2-Pak 0.3 mg/0.3 mL Soaj injection °Generic drug: EPINEPHrine °Inject 0.3 mg into the muscle once as needed for anaphylaxis. °  °  hours as needed for mild pain or headache.   amoxicillin 500 MG capsule Commonly known as: AMOXIL Take 2,000 mg by mouth See admin instructions. Take 2,000 mg by mouth one hour before dental appointments   BIOTIN PO Take 1 tablet by  mouth daily with breakfast.   buPROPion 300 MG 24 hr tablet Commonly known as: WELLBUTRIN XL Take 300 mg by mouth every morning.   EpiPen 2-Pak 0.3 mg/0.3 mL Soaj injection Generic drug: EPINEPHrine Inject 0.3 mg into the muscle once as needed for anaphylaxis.   folic acid 1 MG tablet Commonly known as: FOLVITE Take 2 tablets (2 mg total) by mouth daily.   hydrochlorothiazide 12.5 MG capsule Commonly known as: MICROZIDE Take 12.5 mg by mouth daily.   LUTEIN PO Take 1 capsule by mouth daily with breakfast.   predniSONE 5 MG tablet Commonly known as: DELTASONE Label  & dispense according to the schedule below. take 8 Pills PO for 3 days, 6 Pills PO for 3 days, 4 Pills PO for 3 days, 2 Pills PO for 3 days, 1 Pills PO for 3 days, 1/2 Pill  PO for 3 days then STOP. Total 65 pills.   Synthroid 88 MCG tablet Generic drug: levothyroxine Take 88 mcg by mouth daily before breakfast.   Vitamin D-3 25 MCG (1000 UT) Caps Take 1,000 Units by mouth 3 (three) times a week.   zolpidem 10 MG tablet Commonly known as: AMBIEN Take 1 tablet (10 mg total) by mouth at bedtime.       Follow-up Information    Marton Redwood, MD. Schedule an appointment as soon as possible for a visit in 1 week(s).   Specialty: Internal Medicine Contact information: [place] [address] [phone]        Volanda Napoleon, MD. Schedule an appointment as soon as possible for a visit in 1 week(s).   Specialty: Oncology Contact information: [address] [place] [address] [phone]        Angelia Mould, MD. Schedule an appointment as soon as possible for a visit in 1 week(s).   Specialties: Vascular Surgery, Cardiology Contact information: [address] [place] [address] [phone]           Major procedures and Radiology Reports - PLEASE review detailed and final reports thoroughly  -        Ct Chest W Contrast  Result Date:  10/07/2019 CLINICAL DATA:  73 year old with a lymphoproliferative disorder and recurrent cold agglutinin hemolytic anemia. Patient underwent Rituxan therapy in 2015. She has current symptoms of generalized weakness, fatigue and orthostasis. EXAM: CT CHEST, ABDOMEN, AND PELVIS WITH CONTRAST TECHNIQUE: Multidetector CT imaging of the chest, abdomen and pelvis was performed following the standard protocol during bolus administration of intravenous contrast. CONTRAST:  171m OMNIPAQUE IOHEXOL 300 MG/ML IV. COMPARISON:  02/13/2014. FINDINGS: CT CHEST FINDINGS Cardiovascular: Normal heart size. Severe three-vessel coronary atherosclerosis. RIGHT coronary artery stent. No pericardial effusion. Moderate atherosclerosis involving the thoracic aorta. Ectatic ascending thoracic aorta measuring approximately 4.1 cm diameter, only minimally increased in size since 2015 where it measured 3.9 cm. Atherosclerosis at the origin of the LEFT subclavian artery with possible hemodynamically significant stenosis. Mediastinum/Nodes: No pathologically enlarged mediastinal, hilar or axillary lymph nodes. No mediastinal masses. Normal-appearing esophagus. Normal-appearing thyroid gland. Lungs/Pleura: Scattered ground-glass airspace opacities throughout the UPPER lobes bilaterally. Blebs or air cysts in the RIGHT MIDDLE LOBE and RIGHT LOWER LOBE. Expected mild dependent atelectasis posteriorly in the lower  lobes. No parenchymal nodules or masses. No pleural effusions. Central airways patent without significant bronchial wall thickening. Musculoskeletal: Degenerative disc disease and spondylosis involving the visualized lower cervical spine. Mild osseous demineralization. No acute or significant abnormalities otherwise involving the thoracic spine. CT ABDOMEN PELVIS FINDINGS Hepatobiliary: Liver normal in size and appearance. Surgically absent gallbladder. No unexpected biliary ductal dilation. Pancreas: Normal in appearance without evidence  of mass, ductal dilation, or inflammation. Spleen: Moderate splenomegaly, unchanged since the 2015 CT. No focal splenic parenchymal abnormality. Adrenals/Urinary Tract: Normal appearing adrenal glands. Scarring involving the LOWER pole of the RIGHT kidney, new since the 2015 CT. No renal masses. No hydronephrosis. No urinary tract calculi. Normal appearing urinary bladder. Stomach/Bowel: Stomach normal in appearance for the degree of distention. Normal-appearing small bowel. Elongated transverse colon which extends low into the pelvis prior to extending back upward to the splenic flexure. Descending and sigmoid colon diverticulosis, extensive in the sigmoid region, without evidence of acute diverticulitis. Normal appendix in the RIGHT mid pelvis. Vascular/Lymphatic: Moderate aorto-iliofemoral atherosclerosis without evidence of aneurysm. Normal-appearing portal venous and systemic venous systems. No pathologic lymphadenopathy. Reproductive: Surgically absent uterus. No adnexal masses. Other: None. Musculoskeletal: Degenerative disc disease at L1-2, L4-5 and L5-S1. No acute findings. IMPRESSION: 1. No evidence of recurrent lymphoma in the chest, abdomen or pelvis. 2. Scattered ground-glass airspace opacities throughout the UPPER lobes bilaterally. There are a spectrum of findings in the lungs which can be seen with acute atypical infection (as well as other non-infectious etiologies). In particular, viral pneumonia (including COVID-19) should be considered in the appropriate clinical setting. 3. No acute cardiopulmonary disease otherwise. 4. Ectatic ascending thoracic aorta measuring up to 4.1 cm diameter, only minimally increased in size since 2015 where it measured 3.9 cm. Recommend annual imaging followup by CTA or MRA. This recommendation follows 2010 ACCF/AHA/AATS/ACR/ASA/SCA/SCAI/SIR/STS/SVM Guidelines for the Diagnosis and Management of Patients with Thoracic Aortic Disease. Circulation. 2010; 121: J179-X505.  5. Possible hemodynamically significant stenosis involving the origin of the LEFT subclavian artery. 6. Stable moderate splenomegaly. 7. Descending and sigmoid colon diverticulosis without evidence of acute diverticulitis. Aortic Atherosclerosis (ICD10-I70.0). Aortic aneurysm NOS (ICD10-I71.9) Electronically Signed   By: Evangeline Dakin M.D.   On: 10/07/2019 20:17   Ct Abdomen Pelvis W Contrast  Result Date: 10/07/2019 CLINICAL DATA:  73 year old with a lymphoproliferative disorder and recurrent cold agglutinin hemolytic anemia. Patient underwent Rituxan therapy in 2015. She has current symptoms of generalized weakness, fatigue and orthostasis. EXAM: CT CHEST, ABDOMEN, AND PELVIS WITH CONTRAST TECHNIQUE: Multidetector CT imaging of the chest, abdomen and pelvis was performed following the standard protocol during bolus administration of intravenous contrast. CONTRAST:  126m OMNIPAQUE IOHEXOL 300 MG/ML IV. COMPARISON:  02/13/2014. FINDINGS: CT CHEST FINDINGS Cardiovascular: Normal heart size. Severe three-vessel coronary atherosclerosis. RIGHT coronary artery stent. No pericardial effusion. Moderate atherosclerosis involving the thoracic aorta. Ectatic ascending thoracic aorta measuring approximately 4.1 cm diameter, only minimally increased in size since 2015 where it measured 3.9 cm. Atherosclerosis at the origin of the LEFT subclavian artery with possible hemodynamically significant stenosis. Mediastinum/Nodes: No pathologically enlarged mediastinal, hilar or axillary lymph nodes. No mediastinal masses. Normal-appearing esophagus. Normal-appearing thyroid gland. Lungs/Pleura: Scattered ground-glass airspace opacities throughout the UPPER lobes bilaterally. Blebs or air cysts in the RIGHT MIDDLE LOBE and RIGHT LOWER LOBE. Expected mild dependent atelectasis posteriorly in the lower lobes. No parenchymal nodules or masses. No pleural effusions. Central airways patent without significant bronchial wall  thickening. Musculoskeletal: Degenerative disc disease and spondylosis involving the visualized lower cervical  spine. Mild osseous demineralization. No acute or significant abnormalities otherwise involving the thoracic spine. CT ABDOMEN PELVIS FINDINGS Hepatobiliary: Liver normal in size and appearance. Surgically absent gallbladder. No unexpected biliary ductal dilation. Pancreas: Normal in appearance without evidence of mass, ductal dilation, or inflammation. Spleen: Moderate splenomegaly, unchanged since the 2015 CT. No focal splenic parenchymal abnormality. Adrenals/Urinary Tract: Normal appearing adrenal glands. Scarring involving the LOWER pole of the RIGHT kidney, new since the 2015 CT. No renal masses. No hydronephrosis. No urinary tract calculi. Normal appearing urinary bladder. Stomach/Bowel: Stomach normal in appearance for the degree of distention. Normal-appearing small bowel. Elongated transverse colon which extends low into the pelvis prior to extending back upward to the splenic flexure. Descending and sigmoid colon diverticulosis, extensive in the sigmoid region, without evidence of acute diverticulitis. Normal appendix in the RIGHT mid pelvis. Vascular/Lymphatic: Moderate aorto-iliofemoral atherosclerosis without evidence of aneurysm. Normal-appearing portal venous and systemic venous systems. No pathologic lymphadenopathy. Reproductive: Surgically absent uterus. No adnexal masses. Other: None. Musculoskeletal: Degenerative disc disease at L1-2, L4-5 and L5-S1. No acute findings. IMPRESSION: 1. No evidence of recurrent lymphoma in the chest, abdomen or pelvis. 2. Scattered ground-glass airspace opacities throughout the UPPER lobes bilaterally. There are a spectrum of findings in the lungs which can be seen with acute atypical infection (as well as other non-infectious etiologies). In particular, viral pneumonia (including COVID-19) should be considered in the appropriate clinical setting. 3. No  acute cardiopulmonary disease otherwise. 4. Ectatic ascending thoracic aorta measuring up to 4.1 cm diameter, only minimally increased in size since 2015 where it measured 3.9 cm. Recommend annual imaging followup by CTA or MRA. This recommendation follows 2010 ACCF/AHA/AATS/ACR/ASA/SCA/SCAI/SIR/STS/SVM Guidelines for the Diagnosis and Management of Patients with Thoracic Aortic Disease. Circulation. 2010; 121: L937-T024. 5. Possible hemodynamically significant stenosis involving the origin of the LEFT subclavian artery. 6. Stable moderate splenomegaly. 7. Descending and sigmoid colon diverticulosis without evidence of acute diverticulitis. Aortic Atherosclerosis (ICD10-I70.0). Aortic aneurysm NOS (ICD10-I71.9) Electronically Signed   By: Evangeline Dakin M.D.   On: 10/07/2019 20:17   Dg Chest Portable 1 View  Result Date: 10/07/2019 CLINICAL DATA:  Cough, anemia, weakness EXAM: PORTABLE CHEST 1 VIEW COMPARISON:  Radiograph 11/16/2007 FINDINGS: No consolidation, features of edema, pneumothorax, or effusion. Pulmonary vascularity is normally distributed. The cardiomediastinal contours are unremarkable. No acute osseous or soft tissue abnormality. Cardiac monitoring leads overlie the chest. Vascular calcium in the left neck. IMPRESSION: No acute cardiopulmonary abnormality. Electronically Signed   By: Lovena Le M.D.   On: 10/07/2019 16:30    Micro Results    Recent Results (from the past 240 hour(s))  Urine Culture     Status: Abnormal   Collection Time: 10/07/19  2:14 AM   Specimen: Urine, Random  Result Value Ref Range Status   Specimen Description URINE, RANDOM  Final   Special Requests NONE  Final   Culture (A)  Final    <10,000 COLONIES/mL INSIGNIFICANT GROWTH Performed at [facility], [address] [place], [place] [address]    Report Status 10/09/2019 FINAL  Final  SARS CORONAVIRUS 2 (TAT 6-24 HRS) Nasopharyngeal Nasopharyngeal Swab     Status: Abnormal   Collection Time:  10/07/19  4:30 PM   Specimen: Nasopharyngeal Swab  Result Value Ref Range Status   SARS Coronavirus 2 POSITIVE (A) NEGATIVE Final    Comment: RESULT CALLED TO, READ BACK BY AND VERIFIED WITH: W.WALLIS RN 2125 10/07/2019 MCCORMICK K (NOTE) SARS-CoV-2 target nucleic acids are DETECTED. The SARS-CoV-2  of SARS-CoV-2 by °FDA under an Emergency Use Authorization (EUA). This EUA will remain  °in effect (meaning this test can be used) f °or the duration of the °COVID-19 declaration under Section 564(b)(1) of the Act, 21 U.S.C. °section 360bbb-3(b)(1), unless the authorization is terminated or °revoked sooner. °Performed at [facility], [address] [place], [place] °[address] °  ° ° °Today  ° °Subjective  ° ° °Aelyn Reasner today has no headache,no chest abdominal pain,no new weakness tingling or numbness, feels much better wants to go home today.  °  °Objective  ° °Blood pressure (!) 148/81, pulse (!) 56, temperature 98.1 °F (36.7 °C), temperature source Oral, resp. rate 18, height 5' 5" (1.651 m), weight 61.8 kg, SpO2 98 %. ° °No intake or output data in the 24 hours ending 10/12/19 0930 ° °Exam °Awake Alert, Oriented x 3, No new F.N deficits, Normal affect °[place].AT,PERRAL °Supple Neck,No JVD, No cervical  lymphadenopathy appriciated.  °Symmetrical Chest wall movement, Good air movement bilaterally, CTAB °RRR,No Gallops,Rubs or new Murmurs, No Parasternal Heave °+ve B.Sounds, Abd Soft, Non tender, No organomegaly appriciated, No rebound -guarding or rigidity. °No Cyanosis, Clubbing or edema, No new Rash or bruise ° ° Data Review  ° °CBC w Diff:  °Lab Results  °Component Value Date  ° WBC 9.6 10/12/2019  ° HGB 8.4 (L) 10/12/2019  ° HGB 11.1 (L) 02/23/2017  ° HCT 26.7 (L) 10/12/2019  ° HCT 31.9 (L) 02/23/2017  ° PLT 288 10/12/2019  ° PLT 263 02/23/2017  ° LYMPHOPCT 19 10/12/2019  ° LYMPHOPCT 10.6 (L) 02/23/2017  ° MONOPCT 9 10/12/2019  ° MONOPCT 6.2 02/23/2017  ° EOSPCT 0 10/12/2019  ° EOSPCT 1.1 02/23/2017  ° BASOPCT 0 10/12/2019  ° BASOPCT 0.3 02/23/2017  ° ° °CMP:  °Lab Results  °Component Value Date  ° NA 134 (L) 10/12/2019  ° NA 133 02/13/2014  ° K 3.7 10/12/2019  ° K 4.9 (H) 02/13/2014  ° CL 100 10/12/2019  ° CL 97 (L) 02/13/2014  ° CO2 24 10/12/2019  ° CO2 32 02/13/2014  ° BUN 17 10/12/2019  ° BUN 13 02/13/2014  ° CREATININE 0.64 10/12/2019  ° CREATININE 0.4 (L) 02/13/2014  ° PROT 6.1 (L) 10/12/2019  ° PROT 7.1 02/13/2014  ° ALBUMIN 3.6 10/12/2019  ° ALBUMIN 4.0 02/13/2014  ° BILITOT 1.4 (H) 10/12/2019  ° BILITOT 2.30 (H) 02/13/2014  ° ALKPHOS 111 10/12/2019  ° ALKPHOS 63 02/13/2014  ° AST 29 10/12/2019  ° AST 17 02/13/2014  ° ALT 74 (H) 10/12/2019  ° ALT 15 02/13/2014  °. ° ° °Total Time in preparing paper work, data evaluation and todays exam - 35 minutes ° °Prashant Singh M.D on 10/12/2019 at 9:30 AM ° °[facility]   °Office  [phone] °

## 2019-10-12 NOTE — Discharge Instructions (Signed)
Discuss your CT report with your PCP next visit.  Follow with Primary MD Marton Redwood, MD in 7 days   Get CBC, CMP, 2 view Chest X ray -  checked next visit within 1 week by Primary MD    Activity: As tolerated with Full fall precautions use walker/cane & assistance as needed  Disposition Home    Diet: Heart Healthy    Special Instructions: If you have smoked or chewed Tobacco  in the last 2 yrs please stop smoking, stop any regular Alcohol  and or any Recreational drug use.  On your next visit with your primary care physician please Get Medicines reviewed and adjusted.  Please request your Prim.MD to go over all Hospital Tests and Procedure/Radiological results at the follow up, please get all Hospital records sent to your Prim MD by signing hospital release before you go home.  If you experience worsening of your admission symptoms, develop shortness of breath, life threatening emergency, suicidal or homicidal thoughts you must seek medical attention immediately by calling 911 or calling your MD immediately  if symptoms less severe.  You Must read complete instructions/literature along with all the possible adverse reactions/side effects for all the Medicines you take and that have been prescribed to you. Take any new Medicines after you have completely understood and accpet all the possible adverse reactions/side effects.      Person Under Monitoring Name: Christine [name]  Location: [facility] [facility] [place] [address]   Infection Prevention Recommendations for Individuals Confirmed to have, or Being Evaluated for, 2019 Novel Coronavirus (COVID-19) Infection Who Receive Care at Home  Individuals who are confirmed to have, or are being evaluated for, COVID-19 should follow the prevention steps below until a healthcare provider or local or state health department says they can return to normal activities.  Stay home except to get medical care You should restrict  activities outside your home, except for getting medical care. Do not go to work, school, or public areas, and do not use public transportation or taxis.  Call ahead before visiting your doctor Before your medical appointment, call the healthcare provider and tell them that you have, or are being evaluated for, COVID-19 infection. This will help the healthcare providers office take steps to keep other people from getting infected. Ask your healthcare provider to call the local or state health department.  Monitor your symptoms Seek prompt medical attention if your illness is worsening (e.g., difficulty breathing). Before going to your medical appointment, call the healthcare provider and tell them that you have, or are being evaluated for, COVID-19 infection. Ask your healthcare provider to call the local or state health department.  Wear a facemask You should wear a facemask that covers your nose and mouth when you are in the same room with other people and when you visit a healthcare provider. People who live with or visit you should also wear a facemask while they are in the same room with you.  Separate yourself from other people in your home As much as possible, you should stay in a different room from other people in your home. Also, you should use a separate bathroom, if available.  Avoid sharing household items You should not share dishes, drinking glasses, cups, eating utensils, towels, bedding, or other items with other people in your home. After using these items, you should wash them thoroughly with soap and water.  Cover your coughs and sneezes Cover your mouth and nose with a tissue  when you cough or sneeze, or you can cough or sneeze into your sleeve. Throw used tissues in a lined trash can, and immediately wash your hands with soap and water for at least 20 seconds or use an alcohol-based hand rub.  Wash your [facility] your hands often and thoroughly with soap and  water for at least 20 seconds. You can use an alcohol-based hand sanitizer if soap and water are not available and if your hands are not visibly dirty. Avoid touching your eyes, nose, and mouth with unwashed hands.   Prevention Steps for Caregivers and Household Members of Individuals Confirmed to have, or Being Evaluated for, COVID-19 Infection Being Cared for in the Home  If you live with, or provide care at home for, a person confirmed to have, or being evaluated for, COVID-19 infection please follow these guidelines to prevent infection:  Follow healthcare providers instructions Make sure that you understand and can help the patient follow any healthcare provider instructions for all care.  Provide for the patients basic needs You should help the patient with basic needs in the home and provide support for getting groceries, prescriptions, and other personal needs.  Monitor the patients symptoms If they are getting sicker, call his or her medical provider and tell them that the patient has, or is being evaluated for, COVID-19 infection. This will help the healthcare providers office take steps to keep other people from getting infected. Ask the healthcare provider to call the local or state health department.  Limit the number of people who have contact with the patient  If possible, have only one caregiver for the patient.  Other household members should stay in another home or place of residence. If this is not possible, they should stay  in another room, or be separated from the patient as much as possible. Use a separate bathroom, if available.  Restrict visitors who do not have an essential need to be in the home.  Keep older adults, very young children, and other sick people away from the patient Keep older adults, very young children, and those who have compromised immune systems or chronic health conditions away from the patient. This includes people with chronic  heart, lung, or kidney conditions, diabetes, and cancer.  Ensure good ventilation Make sure that shared spaces in the home have good air flow, such as from an air conditioner or an opened window, weather permitting.  Wash your hands often  Wash your hands often and thoroughly with soap and water for at least 20 seconds. You can use an alcohol based hand sanitizer if soap and water are not available and if your hands are not visibly dirty.  Avoid touching your eyes, nose, and mouth with unwashed hands.  Use disposable paper towels to dry your hands. If not available, use dedicated cloth towels and replace them when they become wet.  Wear a facemask and gloves  Wear a disposable facemask at all times in the room and gloves when you touch or have contact with the patients blood, body fluids, and/or secretions or excretions, such as sweat, saliva, sputum, nasal mucus, vomit, urine, or feces.  Ensure the mask fits over your nose and mouth tightly, and do not touch it during use.  Throw out disposable facemasks and gloves after using them. Do not reuse.  Wash your hands immediately after removing your facemask and gloves.  If your personal clothing becomes contaminated, carefully remove clothing and launder. Wash your hands after handling contaminated clothing.  Place all used disposable facemasks, gloves, and other waste in a lined container before disposing them with other household waste.  Remove gloves and wash your hands immediately after handling these items.  Do not share dishes, glasses, or other household items with the patient  Avoid sharing household items. You should not share dishes, drinking glasses, cups, eating utensils, towels, bedding, or other items with a patient who is confirmed to have, or being evaluated for, COVID-19 infection.  After the person uses these items, you should wash them thoroughly with soap and water.  Wash laundry thoroughly  Immediately remove  and wash clothes or bedding that have blood, body fluids, and/or secretions or excretions, such as sweat, saliva, sputum, nasal mucus, vomit, urine, or feces, on them.  Wear gloves when handling laundry from the patient.  Read and follow directions on labels of laundry or clothing items and detergent. In general, wash and dry with the warmest temperatures recommended on the label.  Clean all areas the individual has used often  Clean all touchable surfaces, such as counters, tabletops, doorknobs, bathroom fixtures, toilets, phones, keyboards, tablets, and bedside tables, every day. Also, clean any surfaces that may have blood, body fluids, and/or secretions or excretions on them.  Wear gloves when cleaning surfaces the patient has come in contact with.  Use a diluted bleach solution (e.g., dilute bleach with 1 part bleach and 10 parts water) or a household disinfectant with a label that says EPA-registered for coronaviruses. To make a bleach solution at home, add 1 tablespoon of bleach to 1 quart (4 cups) of water. For a larger supply, add  cup of bleach to 1 gallon (16 cups) of water.  Read labels of cleaning products and follow recommendations provided on product labels. Labels contain instructions for safe and effective use of the cleaning product including precautions you should take when applying the product, such as wearing gloves or eye protection and making sure you have good ventilation during use of the product.  Remove gloves and wash hands immediately after cleaning.  Monitor yourself for signs and symptoms of illness Caregivers and household members are considered close contacts, should monitor their health, and will be asked to limit movement outside of the home to the extent possible. Follow the monitoring steps for close contacts listed on the symptom monitoring form.   ? If you have additional questions, contact your local health department or call the epidemiologist on call  at [phone] (available 24/7). ? This guidance is subject to change. For the most up-to-date guidance from [facility], please refer to their website: [email or web address]

## 2019-10-14 ENCOUNTER — Other Ambulatory Visit: Payer: Self-pay | Admitting: *Deleted

## 2019-10-14 ENCOUNTER — Telehealth: Payer: Self-pay | Admitting: Hematology & Oncology

## 2019-10-14 ENCOUNTER — Encounter: Payer: Self-pay | Admitting: *Deleted

## 2019-10-14 DIAGNOSIS — D500 Iron deficiency anemia secondary to blood loss (chronic): Secondary | ICD-10-CM

## 2019-10-14 MED ORDER — FOLIC ACID 1 MG PO TABS: 2.0000 mg | ORAL_TABLET | Freq: Every day | ORAL | 3 refills | Status: DC

## 2019-10-14 NOTE — Progress Notes (Unsigned)
Sent a message to scheduling pool stating that Dr. Marin Olp wants patient first time Rituxan and Verl Dicker (labs, visit, chemo) to be scheduled on 11/12.  Per office policy, patient needs to be Covid negative before appt.  Scheduling advised of this.

## 2019-10-14 NOTE — Telephone Encounter (Signed)
Appointments scheduled and I have notified patient.  Appt date may change due to PENDING COVID RESULTS and patinet voiced understanding of this also. Per 11/2 sch msg

## 2019-10-21 ENCOUNTER — Telehealth: Payer: Self-pay | Admitting: *Deleted

## 2019-10-21 NOTE — Telephone Encounter (Signed)
Message received from patient wanting to know if it is ok for her to continue Prednisone prior to her treatment and to also inform this office that she was discharged from the hospital with Covid on 10/12/19.  Pt notified per order of Dr. Marin Olp that it is ok for her to continue with Prednisone as prescribed and that her appt will be moved out to the week of 10/28/19. Message sent to scheduling.

## 2019-10-24 ENCOUNTER — Ambulatory Visit: Payer: Medicare Other | Admitting: Hematology & Oncology

## 2019-10-24 ENCOUNTER — Other Ambulatory Visit: Payer: Medicare Other

## 2019-10-24 ENCOUNTER — Ambulatory Visit: Payer: Medicare Other

## 2019-10-25 ENCOUNTER — Ambulatory Visit: Payer: Medicare Other

## 2019-10-25 ENCOUNTER — Other Ambulatory Visit: Payer: Self-pay | Admitting: *Deleted

## 2019-10-25 DIAGNOSIS — D5912 Cold autoimmune hemolytic anemia: Secondary | ICD-10-CM

## 2019-10-25 DIAGNOSIS — D649 Anemia, unspecified: Secondary | ICD-10-CM

## 2019-10-28 ENCOUNTER — Other Ambulatory Visit: Payer: Self-pay

## 2019-10-28 ENCOUNTER — Encounter: Payer: Self-pay | Admitting: Hematology & Oncology

## 2019-10-28 ENCOUNTER — Telehealth: Payer: Self-pay | Admitting: *Deleted

## 2019-10-28 ENCOUNTER — Inpatient Hospital Stay: Payer: Medicare Other

## 2019-10-28 ENCOUNTER — Inpatient Hospital Stay (HOSPITAL_BASED_OUTPATIENT_CLINIC_OR_DEPARTMENT_OTHER): Payer: Medicare Other | Admitting: Hematology & Oncology

## 2019-10-28 ENCOUNTER — Inpatient Hospital Stay: Payer: Medicare Other | Attending: Hematology & Oncology

## 2019-10-28 VITALS — BP 144/66 | HR 78 | Temp 96.8°F | Resp 18 | Wt 131.0 lb

## 2019-10-28 VITALS — BP 110/60 | HR 81 | Temp 98.4°F | Resp 20

## 2019-10-28 DIAGNOSIS — Z8619 Personal history of other infectious and parasitic diseases: Secondary | ICD-10-CM | POA: Diagnosis not present

## 2019-10-28 DIAGNOSIS — Z5112 Encounter for antineoplastic immunotherapy: Secondary | ICD-10-CM | POA: Insufficient documentation

## 2019-10-28 DIAGNOSIS — D649 Anemia, unspecified: Secondary | ICD-10-CM

## 2019-10-28 DIAGNOSIS — R5383 Other fatigue: Secondary | ICD-10-CM | POA: Diagnosis not present

## 2019-10-28 DIAGNOSIS — Z5111 Encounter for antineoplastic chemotherapy: Secondary | ICD-10-CM | POA: Diagnosis present

## 2019-10-28 DIAGNOSIS — D5912 Cold autoimmune hemolytic anemia: Secondary | ICD-10-CM

## 2019-10-28 DIAGNOSIS — Z79899 Other long term (current) drug therapy: Secondary | ICD-10-CM | POA: Insufficient documentation

## 2019-10-28 LAB — CBC WITH DIFFERENTIAL (CANCER CENTER ONLY)
Abs Immature Granulocytes: 0.16 10*3/uL — ABNORMAL HIGH (ref 0.00–0.07)
Basophils Absolute: 0.1 10*3/uL (ref 0.0–0.1)
Basophils Relative: 1 %
Eosinophils Absolute: 0.1 10*3/uL (ref 0.0–0.5)
Eosinophils Relative: 1 %
HCT: 26.3 % — ABNORMAL LOW (ref 36.0–46.0)
Hemoglobin: 9.1 g/dL — ABNORMAL LOW (ref 12.0–15.0)
Immature Granulocytes: 3 %
Lymphocytes Relative: 11 %
Lymphs Abs: 0.6 10*3/uL — ABNORMAL LOW (ref 0.7–4.0)
MCH: 35.3 pg — ABNORMAL HIGH (ref 26.0–34.0)
MCHC: 34.6 g/dL (ref 30.0–36.0)
MCV: 101.9 fL — ABNORMAL HIGH (ref 80.0–100.0)
Monocytes Absolute: 0.3 10*3/uL (ref 0.1–1.0)
Monocytes Relative: 6 %
Neutro Abs: 4 10*3/uL (ref 1.7–7.7)
Neutrophils Relative %: 78 %
Platelet Count: 201 10*3/uL (ref 150–400)
RBC: 2.58 MIL/uL — ABNORMAL LOW (ref 3.87–5.11)
RDW: 20.6 % — ABNORMAL HIGH (ref 11.5–15.5)
WBC Count: 5.2 10*3/uL (ref 4.0–10.5)
nRBC: 0 % (ref 0.0–0.2)

## 2019-10-28 LAB — CMP (CANCER CENTER ONLY)
ALT: 15 U/L (ref 0–44)
AST: 16 U/L (ref 15–41)
Albumin: 4.1 g/dL (ref 3.5–5.0)
Alkaline Phosphatase: 54 U/L (ref 38–126)
Anion gap: 8 (ref 5–15)
BUN: 17 mg/dL (ref 8–23)
CO2: 27 mmol/L (ref 22–32)
Calcium: 8.8 mg/dL — ABNORMAL LOW (ref 8.9–10.3)
Chloride: 98 mmol/L (ref 98–111)
Creatinine: 0.77 mg/dL (ref 0.44–1.00)
GFR, Est AFR Am: >60 mL/min (ref >60)
GFR, Estimated: >60 mL/min (ref >60)
Glucose, Bld: 136 mg/dL — ABNORMAL HIGH (ref 70–99)
Potassium: 4.2 mmol/L (ref 3.5–5.1)
Sodium: 133 mmol/L — ABNORMAL LOW (ref 135–145)
Total Bilirubin: 3 mg/dL — ABNORMAL HIGH (ref 0.3–1.2)
Total Protein: 6 g/dL — ABNORMAL LOW (ref 6.5–8.1)

## 2019-10-28 LAB — RETICULOCYTES
Immature Retic Fract: 21.4 % — ABNORMAL HIGH (ref 2.3–15.9)
RBC.: 2.54 MIL/uL — ABNORMAL LOW (ref 3.87–5.11)
Retic Count, Absolute: 134.4 10*3/uL (ref 19.0–186.0)
Retic Ct Pct: 5.3 % — ABNORMAL HIGH (ref 0.4–3.1)

## 2019-10-28 LAB — LACTATE DEHYDROGENASE: LDH: 235 U/L — ABNORMAL HIGH (ref 98–192)

## 2019-10-28 LAB — SAVE SMEAR(SSMR), FOR PROVIDER SLIDE REVIEW

## 2019-10-28 MED ORDER — PALONOSETRON HCL INJECTION 0.25 MG/5ML
0.2500 mg | Freq: Once | INTRAVENOUS | Status: AC
  Administered 2019-10-28: 0.25 mg via INTRAVENOUS

## 2019-10-28 MED ORDER — DEXAMETHASONE SODIUM PHOSPHATE 10 MG/ML IJ SOLN
10.0000 mg | Freq: Once | INTRAMUSCULAR | Status: AC
  Administered 2019-10-28: 10 mg via INTRAVENOUS

## 2019-10-28 MED ORDER — SODIUM CHLORIDE 0.9 % IV SOLN
375.0000 mg/m2 | Freq: Once | INTRAVENOUS | Status: AC
  Administered 2019-10-28: 600 mg via INTRAVENOUS
  Filled 2019-10-28: qty 50

## 2019-10-28 MED ORDER — ACETAMINOPHEN 325 MG PO TABS
ORAL_TABLET | ORAL | Status: AC
  Filled 2019-10-28: qty 2

## 2019-10-28 MED ORDER — PALONOSETRON HCL INJECTION 0.25 MG/5ML
INTRAVENOUS | Status: AC
  Filled 2019-10-28: qty 5

## 2019-10-28 MED ORDER — SODIUM CHLORIDE 0.9 % IV SOLN
72.0000 mg/m2 | Freq: Once | INTRAVENOUS | Status: AC
  Administered 2019-10-28: 125 mg via INTRAVENOUS
  Filled 2019-10-28: qty 5

## 2019-10-28 MED ORDER — SODIUM CHLORIDE 0.9 % IV SOLN
Freq: Once | INTRAVENOUS | Status: AC
  Administered 2019-10-28: 10:00:00 via INTRAVENOUS
  Filled 2019-10-28: qty 250

## 2019-10-28 MED ORDER — DIPHENHYDRAMINE HCL 25 MG PO CAPS
50.0000 mg | ORAL_CAPSULE | Freq: Once | ORAL | Status: AC
  Administered 2019-10-28: 50 mg via ORAL

## 2019-10-28 MED ORDER — ACETAMINOPHEN 325 MG PO TABS
650.0000 mg | ORAL_TABLET | Freq: Once | ORAL | Status: AC
  Administered 2019-10-28: 650 mg via ORAL

## 2019-10-28 MED ORDER — DEXAMETHASONE SODIUM PHOSPHATE 10 MG/ML IJ SOLN
INTRAMUSCULAR | Status: AC
  Filled 2019-10-28: qty 1

## 2019-10-28 MED ORDER — DIPHENHYDRAMINE HCL 25 MG PO CAPS
ORAL_CAPSULE | ORAL | Status: AC
  Filled 2019-10-28: qty 2

## 2019-10-28 NOTE — Patient Instructions (Signed)
[facility] Discharge Instructions for Patients Receiving Chemotherapy  Today you received the following chemotherapy agents Rituxan and Bendeka  To help prevent nausea and vomiting after your treatment, we encourage you to take your nausea medication as prescribed by MD.   If you develop nausea and vomiting that is not controlled by your nausea medication, call the clinic.   BELOW ARE SYMPTOMS THAT SHOULD BE REPORTED IMMEDIATELY:  *FEVER GREATER THAN 100.5 F  *CHILLS WITH OR WITHOUT FEVER  NAUSEA AND VOMITING THAT IS NOT CONTROLLED WITH YOUR NAUSEA MEDICATION  *UNUSUAL SHORTNESS OF BREATH  *UNUSUAL BRUISING OR BLEEDING  TENDERNESS IN MOUTH AND THROAT WITH OR WITHOUT PRESENCE OF ULCERS  *URINARY PROBLEMS  *BOWEL PROBLEMS  UNUSUAL RASH Items with * indicate a potential emergency and should be followed up as soon as possible.  Feel free to call the clinic should you have any questions or concerns. The clinic phone number is [phone].  Please show the [facility] at check-in to the Emergency Department and triage nurse.  Rituximab injection What is this medicine? RITUXIMAB (ri TUX i mab) is a monoclonal antibody. It is used to treat certain types of cancer like non-Hodgkin lymphoma and chronic lymphocytic leukemia. It is also used to treat rheumatoid arthritis, granulomatosis with polyangiitis (or Wegener's granulomatosis), microscopic polyangiitis, and pemphigus vulgaris. This medicine may be used for other purposes; ask your health care provider or pharmacist if you have questions. COMMON BRAND NAME(S): Rituxan, RUXIENCE What should I tell my health care provider before I take this medicine? They need to know if you have any of these conditions:  heart disease  infection (especially a virus infection such as hepatitis B, chickenpox, cold sores, or herpes)  immune system problems  irregular heartbeat  kidney disease  low blood counts, like  low white cell, platelet, or red cell counts  lung or breathing disease, like asthma  recently received or scheduled to receive a vaccine  an unusual or allergic reaction to rituximab, other medicines, foods, dyes, or preservatives  pregnant or trying to get pregnant  breast-feeding How should I use this medicine? This medicine is for infusion into a vein. It is administered in a hospital or clinic by a specially trained health care professional. A special MedGuide will be given to you by the pharmacist with each prescription and refill. Be sure to read this information carefully each time. Talk to your pediatrician regarding the use of this medicine in children. This medicine is not approved for use in children. Overdosage: If you think you have taken too much of this medicine contact a poison control center or emergency room at once. NOTE: This medicine is only for you. Do not share this medicine with others. What if I miss a dose? It is important not to miss a dose. Call your doctor or health care professional if you are unable to keep an appointment. What may interact with this medicine?  cisplatin  live virus vaccines This list may not describe all possible interactions. Give your health care provider a list of all the medicines, herbs, non-prescription drugs, or dietary supplements you use. Also tell them if you smoke, drink alcohol, or use illegal drugs. Some items may interact with your medicine. What should I watch for while using this medicine? Your condition will be monitored carefully while you are receiving this medicine. You may need blood work done while you are taking this medicine. This medicine can cause serious allergic reactions. To reduce  your risk you may need to take medicine before treatment with this medicine. Take your medicine as directed. In some patients, this medicine may cause a serious brain infection that may cause death. If you have any problems seeing,  thinking, speaking, walking, or standing, tell your healthcare professional right away. If you cannot reach your healthcare professional, urgently seek other source of medical care. Call your doctor or health care professional for advice if you get a fever, chills or sore throat, or other symptoms of a cold or flu. Do not treat yourself. This drug decreases your body's ability to fight infections. Try to avoid being around people who are sick. Do not become pregnant while taking this medicine or for at least 12 months after stopping it. Women should inform their doctor if they wish to become pregnant or think they might be pregnant. There is a potential for serious side effects to an unborn child. Talk to your health care professional or pharmacist for more information. Do not breast-feed an infant while taking this medicine or for at least 6 months after stopping it. What side effects may I notice from receiving this medicine? Side effects that you should report to your doctor or health care professional as soon as possible:  allergic reactions like skin rash, itching or hives; swelling of the face, lips, or tongue  breathing problems  chest pain  changes in vision  diarrhea  headache with fever, neck stiffness, sensitivity to light, nausea, or confusion  fast, irregular heartbeat  loss of memory  low blood counts - this medicine may decrease the number of white blood cells, red blood cells and platelets. You may be at increased risk for infections and bleeding.  mouth sores  problems with balance, talking, or walking  redness, blistering, peeling or loosening of the skin, including inside the mouth  signs of infection - fever or chills, cough, sore throat, pain or difficulty passing urine  signs and symptoms of kidney injury like trouble passing urine or change in the amount of urine  signs and symptoms of liver injury like dark yellow or brown urine; general ill feeling or  flu-like symptoms; light-colored stools; loss of appetite; nausea; right upper belly pain; unusually weak or tired; yellowing of the eyes or skin  signs and symptoms of low blood pressure like dizziness; feeling faint or lightheaded, falls; unusually weak or tired  stomach pain  swelling of the ankles, feet, hands  unusual bleeding or bruising  vomiting Side effects that usually do not require medical attention (report to your doctor or health care professional if they continue or are bothersome):  headache  joint pain  muscle cramps or muscle pain  nausea  tiredness This list may not describe all possible side effects. Call your doctor for medical advice about side effects. You may report side effects to FDA at 1-800-FDA-1088. Where should I keep my medicine? This drug is given in a hospital or clinic and will not be stored at home. NOTE: This sheet is a summary. It may not cover all possible information. If you have questions about this medicine, talk to your doctor, pharmacist, or health care provider.  2020 Elsevier/Gold Standard (2019-01-09 22:01:36)   Bendamustine Injection What is this medicine? BENDAMUSTINE (BEN da MUS teen) is a chemotherapy drug. It is used to treat chronic lymphocytic leukemia and non-Hodgkin lymphoma. This medicine may be used for other purposes; ask your health care provider or pharmacist if you have questions. COMMON BRAND NAME(S): BELRAPZO, BENDEKA,  Treanda What should I tell my health care provider before I take this medicine? They need to know if you have any of these conditions:  infection (especially a virus infection such as chickenpox, cold sores, or herpes)  kidney disease  liver disease  an unusual or allergic reaction to bendamustine, mannitol, other medicines, foods, dyes, or preservatives  pregnant or trying to get pregnant  breast-feeding How should I use this medicine? This medicine is for infusion into a vein. It is given  by a health care professional in a hospital or clinic setting. Talk to your pediatrician regarding the use of this medicine in children. Special care may be needed. Overdosage: If you think you have taken too much of this medicine contact a poison control center or emergency room at once. NOTE: This medicine is only for you. Do not share this medicine with others. What if I miss a dose? It is important not to miss your dose. Call your doctor or health care professional if you are unable to keep an appointment. What may interact with this medicine? Do not take this medicine with any of the following medications:  clozapine This medicine may also interact with the following medications:  atazanavir  cimetidine  ciprofloxacin  enoxacin  fluvoxamine  medicines for seizures like carbamazepine and phenobarbital  mexiletine  rifampin  tacrine  thiabendazole  zileuton This list may not describe all possible interactions. Give your health care provider a list of all the medicines, herbs, non-prescription drugs, or dietary supplements you use. Also tell them if you smoke, drink alcohol, or use illegal drugs. Some items may interact with your medicine. What should I watch for while using this medicine? This drug may make you feel generally unwell. This is not uncommon, as chemotherapy can affect healthy cells as well as cancer cells. Report any side effects. Continue your course of treatment even though you feel ill unless your doctor tells you to stop. You may need blood work done while you are taking this medicine. Call your doctor or healthcare provider for advice if you get a fever, chills or sore throat, or other symptoms of a cold or flu. Do not treat yourself. This drug decreases your body's ability to fight infections. Try to avoid being around people who are sick. This medicine may cause serious skin reactions. They can happen weeks to months after starting the medicine. Contact  your healthcare provider right away if you notice fevers or flu-like symptoms with a rash. The rash may be red or purple and then turn into blisters or peeling of the skin. Or, you might notice a red rash with swelling of the face, lips or lymph nodes in your neck or under your arms. This medicine may increase your risk to bruise or bleed. Call your doctor or healthcare provider if you notice any unusual bleeding. Talk to your doctor about your risk of cancer. You may be more at risk for certain types of cancers if you take this medicine. Do not become pregnant while taking this medicine or for at least 6 months after stopping it. Women should inform their doctor if they wish to become pregnant or think they might be pregnant. Men should not father a child while taking this medicine and for at least 3 months after stopping it. There is a potential for serious side effects to an unborn child. Talk to your healthcare provider or pharmacist for more information. Do not breast-feed an infant while taking this medicine or for  at least 1 week after stopping it. This medicine may make it more difficult to father a child. You should talk with your doctor or healthcare provider if you are concerned about your fertility. What side effects may I notice from receiving this medicine? Side effects that you should report to your doctor or health care professional as soon as possible:  allergic reactions like skin rash, itching or hives, swelling of the face, lips, or tongue  low blood counts - this medicine may decrease the number of white blood cells, red blood cells and platelets. You may be at increased risk for infections and bleeding.  rash, fever, and swollen lymph nodes  redness, blistering, peeling, or loosening of the skin, including inside the mouth  signs of infection like fever or chills, cough, sore throat, pain or difficulty passing urine  signs of decreased platelets or bleeding like bruising,  pinpoint red spots on the skin, black, tarry stools, blood in the urine  signs of decreased red blood cells like being unusually weak or tired, fainting spells, lightheadedness  signs and symptoms of kidney injury like trouble passing urine or change in the amount of urine  signs and symptoms of liver injury like dark yellow or brown urine; general ill feeling or flu-like symptoms; light-colored stools; loss of appetite; nausea; right upper belly pain; unusually weak or tired; yellowing of the eyes or skin Side effects that usually do not require medical attention (report to your doctor or health care professional if they continue or are bothersome):  constipation  decreased appetite  diarrhea  headache  mouth sores  nausea, vomiting  tiredness This list may not describe all possible side effects. Call your doctor for medical advice about side effects. You may report side effects to FDA at 1-800-FDA-1088. Where should I keep my medicine? This drug is given in a hospital or clinic and will not be stored at home. NOTE: This sheet is a summary. It may not cover all possible information. If you have questions about this medicine, talk to your doctor, pharmacist, or health care provider.  2020 Elsevier/Gold Standard (2019-02-19 10:26:46)

## 2019-10-28 NOTE — Progress Notes (Signed)
Dr. Marin Olp has reviewed patient's labs including Tbili. He will proceed with treatment today with the current doses.

## 2019-10-28 NOTE — Progress Notes (Signed)
Hematology and Oncology Follow Up Visit  Christine [name] [id number] 15-Dec-1945 73 y.o. 10/28/2019   Principle Diagnosis:   Cold Agglutinin Disease -- Recurrent  COVID-19 (+)  Current Therapy:    Rituxan/Bemdamustine -- cycle #1-- started on 10/28/2019     Interim History:  [name] is back for a long awaited follow-up.  I actually saw her in the emergency room a couple weeks ago.  She came in with a recurrence of her cold agglutinin disease.  Her hemoglobin was I think 5.4.  At the time, she also was found to have the coronavirus.  She was admitted to [facility].  She really felt okay from that perspective.  She was given some steroids.  She is a little bit more spunky.  She does feel tired.  When we saw her, she had a CAT scan done of her chest/abdomen/pelvis.  Thankfully, there is no evidence of obvious lymphoma.  She had flow cytometry done.  This did not show any obvious monoclonal population of B cells.  I am sure she had a cold agglutinin titer done.  Unfortunate I really cannot find this in the system right now.  She had iron studies done which showed adequate iron stores.  Again, I think the steroids probably have helped her.  We will go ahead and get her started on treatment with Rituxan/bendamustine.  I think this is going to be helpful.  I think with recurrence of the cold agglutinin disease, the bendamustine added to the Rituxan will help eradicate any abnormal B-cell that might be producing the cold agglutinin protein.  She has had no fever.  She has had no cough.  There has been no nausea or vomiting.  Currently, her performance status is ECOG 1.  Medications:  Current Outpatient Medications:  .  acetaminophen (TYLENOL) 500 MG tablet, Take 500-1,000 mg by mouth every 6 (six) hours as needed for mild pain or headache., Disp: , Rfl:  .  amoxicillin (AMOXIL) 500 MG capsule, Take 2,000 mg by mouth See admin instructions. Take 2,000 mg by mouth one  hour before dental appointments, Disp: , Rfl:  .  BIOTIN PO, Take 1 tablet by mouth daily with breakfast., Disp: , Rfl:  .  buPROPion (WELLBUTRIN XL) 300 MG 24 hr tablet, Take 300 mg by mouth every morning. , Disp: , Rfl:  .  Cholecalciferol (VITAMIN D-3) 25 MCG (1000 UT) CAPS, Take 1,000 Units by mouth 3 (three) times a week., Disp: , Rfl:  .  EPIPEN 2-PAK 0.3 MG/0.3ML SOAJ injection, Inject 0.3 mg into the muscle once as needed for anaphylaxis. , Disp: , Rfl: 6 .  folic acid (FOLVITE) 1 MG tablet, Take 2 tablets (2 mg total) by mouth daily., Disp: 180 tablet, Rfl: 3 .  hydrochlorothiazide (MICROZIDE) 12.5 MG capsule, Take 12.5 mg by mouth daily., Disp: , Rfl: 11 .  LUTEIN PO, Take 1 capsule by mouth daily with breakfast., Disp: , Rfl:  .  predniSONE (DELTASONE) 5 MG tablet, Label  & dispense according to the schedule below. take 8 Pills PO for 3 days, 6 Pills PO for 3 days, 4 Pills PO for 3 days, 2 Pills PO for 3 days, 1 Pills PO for 3 days, 1/2 Pill  PO for 3 days then STOP. Total 65 pills., Disp: 65 tablet, Rfl: 0 .  SYNTHROID 88 MCG tablet, Take 88 mcg by mouth daily before breakfast. , Disp: , Rfl:  .  zolpidem (AMBIEN) 10 MG tablet, Take 1 tablet (  10 mg total) by mouth at bedtime., Disp: 30 tablet, Rfl: 3  Allergies:  Allergies  Allergen Reactions  . Bee Venom Anaphylaxis and Shortness Of Breath    Past Medical History, Surgical history, Social history, and Family History were reviewed and updated.  Review of Systems: Review of Systems  Constitutional: Positive for fatigue.  HENT:  Negative.   Eyes: Negative.   Respiratory: Negative.   Cardiovascular: Negative.   Gastrointestinal: Negative.   Endocrine: Negative.   Genitourinary: Negative.    Musculoskeletal: Negative.   Skin: Negative.   Neurological: Negative.   Hematological: Negative.   Psychiatric/Behavioral: Negative.     Physical Exam:  weight is 131 lb (59.4 kg). Her temporal temperature is 96.8 F (36 C)  (abnormal). Her blood pressure is 144/66 (abnormal) and her pulse is 78. Her respiration is 18 and oxygen saturation is 100%.   Wt Readings from Last 3 Encounters:  10/28/19 131 lb (59.4 kg)  10/10/19 136 lb 3.9 oz (61.8 kg)  04/16/19 133 lb (60.3 kg)    Physical Exam Vitals signs reviewed.  HENT:     Head: Normocephalic and atraumatic.  Eyes:     Pupils: Pupils are equal, round, and reactive to light.  Neck:     Musculoskeletal: Normal range of motion.  Cardiovascular:     Rate and Rhythm: Normal rate and regular rhythm.     Heart sounds: Normal heart sounds.  Pulmonary:     Effort: Pulmonary effort is normal.     Breath sounds: Normal breath sounds.  Abdominal:     General: Bowel sounds are normal.     Palpations: Abdomen is soft.  Musculoskeletal: Normal range of motion.        General: No tenderness or deformity.  Lymphadenopathy:     Cervical: No cervical adenopathy.  Skin:    General: Skin is warm and dry.     Findings: No erythema or rash.  Neurological:     Mental Status: She is alert and oriented to person, place, and time.  Psychiatric:        Behavior: Behavior normal.        Thought Content: Thought content normal.        Judgment: Judgment normal.      Lab Results  Component Value Date   WBC 5.2 10/28/2019   HGB 9.1 (L) 10/28/2019   HCT 26.3 (L) 10/28/2019   MCV 101.9 (H) 10/28/2019   PLT 201 10/28/2019     Chemistry      Component Value Date/Time   NA 133 (L) 10/28/2019 0801   NA 133 02/13/2014 1108   K 4.2 10/28/2019 0801   K 4.9 (H) 02/13/2014 1108   CL 98 10/28/2019 0801   CL 97 (L) 02/13/2014 1108   CO2 27 10/28/2019 0801   CO2 32 02/13/2014 1108   BUN 17 10/28/2019 0801   BUN 13 02/13/2014 1108   CREATININE 0.77 10/28/2019 0801   CREATININE 0.4 (L) 02/13/2014 1108      Component Value Date/Time   CALCIUM 8.8 (L) 10/28/2019 0801   CALCIUM 9.3 02/13/2014 1108   ALKPHOS 54 10/28/2019 0801   ALKPHOS 63 02/13/2014 1108   AST 16  10/28/2019 0801   ALT 15 10/28/2019 0801   ALT 15 02/13/2014 1108   BILITOT 3.0 (H) 10/28/2019 0801       Impression and Plan: [name] is a 73 year old white female.  She is a history of cold agglutinin disease.  We treated her with  Rituxan back in April 2015.  She tolerated this quite well.  She now appears to have a recurrence of the cold agglutinin disease.  She will be treated with Rituxan/bendamustine.  I think the bendamustine added will give [place] a bit more of a response.  I am not sure if the coronavirus had any impact on the recurrence of the cold agglutinin disease.  I would not think so but then again, there are things that we do not know about the coronavirus that could make this a potential etiology.  We will go ahead with her first cycle today.  We will plan to get her back in 4 weeks for a second cycle.  We will plan for probably 4 cycles.  I spent about 40 minutes with her today.  I went over the protocol.  I went over the side effects.  I answered all of her questions.   Volanda Napoleon, MD 11/16/20209:09 AM

## 2019-10-28 NOTE — Telephone Encounter (Signed)
Bilirubin 3.0.  Dr Marin Olp notified.  No orders received

## 2019-10-29 ENCOUNTER — Other Ambulatory Visit: Payer: Self-pay

## 2019-10-29 ENCOUNTER — Other Ambulatory Visit: Payer: Self-pay | Admitting: *Deleted

## 2019-10-29 ENCOUNTER — Inpatient Hospital Stay: Payer: Medicare Other

## 2019-10-29 VITALS — BP 133/60 | HR 70 | Temp 96.7°F | Resp 20

## 2019-10-29 DIAGNOSIS — Z5112 Encounter for antineoplastic immunotherapy: Secondary | ICD-10-CM | POA: Diagnosis not present

## 2019-10-29 DIAGNOSIS — D5912 Cold autoimmune hemolytic anemia: Secondary | ICD-10-CM

## 2019-10-29 LAB — HAPTOGLOBIN: Haptoglobin: <10 mg/dL — ABNORMAL LOW (ref 42–346)

## 2019-10-29 LAB — COLD AGGLUTININ TITER: Cold Agglutinin Titer: >4096

## 2019-10-29 MED ORDER — PROCHLORPERAZINE MALEATE 10 MG PO TABS: 10.0000 mg | ORAL_TABLET | Freq: Four times a day (QID) | ORAL | 1 refills | Status: DC | PRN

## 2019-10-29 MED ORDER — ONDANSETRON HCL 8 MG PO TABS: 8.0000 mg | ORAL_TABLET | Freq: Three times a day (TID) | ORAL | 0 refills | Status: DC | PRN

## 2019-10-29 MED ORDER — DEXAMETHASONE SODIUM PHOSPHATE 10 MG/ML IJ SOLN
INTRAMUSCULAR | Status: AC
  Filled 2019-10-29: qty 1

## 2019-10-29 MED ORDER — SODIUM CHLORIDE 0.9 % IV SOLN
Freq: Once | INTRAVENOUS | Status: AC
  Administered 2019-10-29: 14:00:00 via INTRAVENOUS
  Filled 2019-10-29: qty 250

## 2019-10-29 MED ORDER — SODIUM CHLORIDE 0.9 % IV SOLN
72.0000 mg/m2 | Freq: Once | INTRAVENOUS | Status: AC
  Administered 2019-10-29: 125 mg via INTRAVENOUS
  Filled 2019-10-29: qty 5

## 2019-10-29 MED ORDER — DEXAMETHASONE SODIUM PHOSPHATE 10 MG/ML IJ SOLN
10.0000 mg | Freq: Once | INTRAMUSCULAR | Status: AC
  Administered 2019-10-29: 10 mg via INTRAVENOUS

## 2019-11-01 ENCOUNTER — Other Ambulatory Visit: Payer: Self-pay | Admitting: Hematology & Oncology

## 2019-11-21 ENCOUNTER — Ambulatory Visit: Payer: Medicare Other

## 2019-11-21 ENCOUNTER — Other Ambulatory Visit: Payer: Medicare Other

## 2019-11-21 ENCOUNTER — Ambulatory Visit: Payer: Medicare Other | Admitting: Hematology & Oncology

## 2019-11-22 ENCOUNTER — Ambulatory Visit: Payer: Medicare Other

## 2019-11-25 ENCOUNTER — Inpatient Hospital Stay: Payer: Medicare Other | Attending: Hematology & Oncology

## 2019-11-25 ENCOUNTER — Other Ambulatory Visit: Payer: Self-pay

## 2019-11-25 ENCOUNTER — Inpatient Hospital Stay: Payer: Medicare Other

## 2019-11-25 ENCOUNTER — Inpatient Hospital Stay (HOSPITAL_BASED_OUTPATIENT_CLINIC_OR_DEPARTMENT_OTHER): Payer: Medicare Other | Admitting: Hematology & Oncology

## 2019-11-25 ENCOUNTER — Encounter: Payer: Self-pay | Admitting: Hematology & Oncology

## 2019-11-25 VITALS — BP 127/69 | HR 70 | Temp 98.7°F | Resp 16

## 2019-11-25 VITALS — BP 158/76 | HR 69 | Temp 97.5°F | Resp 18 | Wt 131.0 lb

## 2019-11-25 DIAGNOSIS — Z79899 Other long term (current) drug therapy: Secondary | ICD-10-CM | POA: Insufficient documentation

## 2019-11-25 DIAGNOSIS — Z5112 Encounter for antineoplastic immunotherapy: Secondary | ICD-10-CM | POA: Insufficient documentation

## 2019-11-25 DIAGNOSIS — Z8619 Personal history of other infectious and parasitic diseases: Secondary | ICD-10-CM | POA: Diagnosis not present

## 2019-11-25 DIAGNOSIS — Z5111 Encounter for antineoplastic chemotherapy: Secondary | ICD-10-CM | POA: Diagnosis present

## 2019-11-25 DIAGNOSIS — D5912 Cold autoimmune hemolytic anemia: Secondary | ICD-10-CM

## 2019-11-25 LAB — CBC WITH DIFFERENTIAL (CANCER CENTER ONLY)
Abs Immature Granulocytes: 0.06 10*3/uL (ref 0.00–0.07)
Basophils Absolute: 0.1 10*3/uL (ref 0.0–0.1)
Basophils Relative: 1 %
Eosinophils Absolute: 0.1 10*3/uL (ref 0.0–0.5)
Eosinophils Relative: 3 %
HCT: 23.4 % — ABNORMAL LOW (ref 36.0–46.0)
Hemoglobin: 8.2 g/dL — ABNORMAL LOW (ref 12.0–15.0)
Immature Granulocytes: 2 %
Lymphocytes Relative: 8 %
Lymphs Abs: 0.3 10*3/uL — ABNORMAL LOW (ref 0.7–4.0)
MCH: 34.5 pg — ABNORMAL HIGH (ref 26.0–34.0)
MCHC: 35 g/dL (ref 30.0–36.0)
MCV: 98.3 fL (ref 80.0–100.0)
Monocytes Absolute: 0.4 10*3/uL (ref 0.1–1.0)
Monocytes Relative: 11 %
Neutro Abs: 2.8 10*3/uL (ref 1.7–7.7)
Neutrophils Relative %: 75 %
Platelet Count: 243 10*3/uL (ref 150–400)
RBC: 2.38 MIL/uL — ABNORMAL LOW (ref 3.87–5.11)
RDW: 16.9 % — ABNORMAL HIGH (ref 11.5–15.5)
WBC Count: 3.6 10*3/uL — ABNORMAL LOW (ref 4.0–10.5)
nRBC: 0 % (ref 0.0–0.2)

## 2019-11-25 LAB — CMP (CANCER CENTER ONLY)
ALT: 18 U/L (ref 0–44)
AST: 19 U/L (ref 15–41)
Albumin: 4.2 g/dL (ref 3.5–5.0)
Alkaline Phosphatase: 52 U/L (ref 38–126)
Anion gap: 8 (ref 5–15)
BUN: 12 mg/dL (ref 8–23)
CO2: 26 mmol/L (ref 22–32)
Calcium: 8.9 mg/dL (ref 8.9–10.3)
Chloride: 104 mmol/L (ref 98–111)
Creatinine: 0.64 mg/dL (ref 0.44–1.00)
GFR, Est AFR Am: >60 mL/min (ref >60)
GFR, Estimated: >60 mL/min (ref >60)
Glucose, Bld: 91 mg/dL (ref 70–99)
Potassium: 3.8 mmol/L (ref 3.5–5.1)
Sodium: 138 mmol/L (ref 135–145)
Total Bilirubin: 2.6 mg/dL — ABNORMAL HIGH (ref 0.3–1.2)
Total Protein: 6.2 g/dL — ABNORMAL LOW (ref 6.5–8.1)

## 2019-11-25 LAB — RETIC PANEL
Immature Retic Fract: 25.7 % — ABNORMAL HIGH (ref 2.3–15.9)
RBC.: 2.4 MIL/uL — ABNORMAL LOW (ref 3.87–5.11)
Retic Count, Absolute: 194.9 10*3/uL — ABNORMAL HIGH (ref 19.0–186.0)
Retic Ct Pct: 8.1 % — ABNORMAL HIGH (ref 0.4–3.1)
Reticulocyte Hemoglobin: 35.1 pg (ref >27.9)

## 2019-11-25 LAB — SAVE SMEAR(SSMR), FOR PROVIDER SLIDE REVIEW

## 2019-11-25 LAB — LACTATE DEHYDROGENASE: LDH: 353 U/L — ABNORMAL HIGH (ref 98–192)

## 2019-11-25 MED ORDER — DIPHENHYDRAMINE HCL 25 MG PO CAPS
ORAL_CAPSULE | ORAL | Status: AC
  Filled 2019-11-25: qty 2

## 2019-11-25 MED ORDER — DEXAMETHASONE SODIUM PHOSPHATE 10 MG/ML IJ SOLN
INTRAMUSCULAR | Status: AC
  Filled 2019-11-25: qty 1

## 2019-11-25 MED ORDER — PALONOSETRON HCL INJECTION 0.25 MG/5ML
0.2500 mg | Freq: Once | INTRAVENOUS | Status: AC
  Administered 2019-11-25: 0.25 mg via INTRAVENOUS

## 2019-11-25 MED ORDER — SODIUM CHLORIDE 0.9 % IV SOLN
72.0000 mg/m2 | Freq: Once | INTRAVENOUS | Status: AC
  Administered 2019-11-25: 125 mg via INTRAVENOUS
  Filled 2019-11-25: qty 5

## 2019-11-25 MED ORDER — SODIUM CHLORIDE 0.9 % IV SOLN
Freq: Once | INTRAVENOUS | Status: AC
  Administered 2019-11-25: 09:00:00 via INTRAVENOUS
  Filled 2019-11-25: qty 250

## 2019-11-25 MED ORDER — PALONOSETRON HCL INJECTION 0.25 MG/5ML
INTRAVENOUS | Status: AC
  Filled 2019-11-25: qty 5

## 2019-11-25 MED ORDER — ACETAMINOPHEN 325 MG PO TABS
650.0000 mg | ORAL_TABLET | Freq: Once | ORAL | Status: AC
  Administered 2019-11-25: 650 mg via ORAL

## 2019-11-25 MED ORDER — SODIUM CHLORIDE 0.9 % IV SOLN
375.0000 mg/m2 | Freq: Once | INTRAVENOUS | Status: AC
  Administered 2019-11-25: 600 mg via INTRAVENOUS
  Filled 2019-11-25: qty 10

## 2019-11-25 MED ORDER — DEXAMETHASONE SODIUM PHOSPHATE 10 MG/ML IJ SOLN
10.0000 mg | Freq: Once | INTRAMUSCULAR | Status: AC
  Administered 2019-11-25: 10 mg via INTRAVENOUS

## 2019-11-25 MED ORDER — ACETAMINOPHEN 325 MG PO TABS
ORAL_TABLET | ORAL | Status: AC
  Filled 2019-11-25: qty 2

## 2019-11-25 MED ORDER — DIPHENHYDRAMINE HCL 25 MG PO CAPS
50.0000 mg | ORAL_CAPSULE | Freq: Once | ORAL | Status: AC
  Administered 2019-11-25: 50 mg via ORAL

## 2019-11-25 NOTE — Progress Notes (Signed)
Hematology and Oncology Follow Up Visit  Christine [name] [id number] 11-Aug-1946 73 y.o. 11/25/2019   Principle Diagnosis:   Cold Agglutinin Disease -- Recurrent  COVID-19 (+)  Current Therapy:    Rituxan/Bemdamustine -- s/p cycle #1-- started on 10/28/2019     Interim History:  Christine [name] is back for follow-up.  She had a first cycle of chemotherapy.  She tolerated this well.  Her hemoglobin is still on the lower side.  Hopefully, we will get her cold agglutinin disease down.  Her reticulocyte count is still up.  Her bilirubin is up a little bit.  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  She is still working.  She works at a [employer] (life [employer]) in [place].  She actually knows my wife quite well.  She had a nice Thanksgiving.  It was very quiet was just her and her husband.  She has had no problems with fever.  There is no cough.  She has had no obvious change in bowel or bladder habits.    Overall, her performance status is ECOG 1.     Medications:  Current Outpatient Medications:  .  acetaminophen (TYLENOL) 500 MG tablet, Take 500-1,000 mg by mouth every 6 (six) hours as needed for mild pain or headache., Disp: , Rfl:  .  BIOTIN PO, Take 1 tablet by mouth daily with breakfast., Disp: , Rfl:  .  buPROPion (WELLBUTRIN XL) 300 MG 24 hr tablet, Take 300 mg by mouth every morning. , Disp: , Rfl:  .  Cholecalciferol (VITAMIN D-3) 25 MCG (1000 UT) CAPS, Take 1,000 Units by mouth 3 (three) times a week., Disp: , Rfl:  .  clobetasol (TEMOVATE) 0.05 % external solution, , Disp: , Rfl:  .  folic acid (FOLVITE) 1 MG tablet, Take 2 tablets (2 mg total) by mouth daily., Disp: 180 tablet, Rfl: 3 .  hydrochlorothiazide (MICROZIDE) 12.5 MG capsule, Take 12.5 mg by mouth daily., Disp: , Rfl: 11 .  LUTEIN PO, Take 1 capsule by mouth daily with breakfast., Disp: , Rfl:  .  SYNTHROID 88 MCG tablet, Take 88 mcg by mouth daily before breakfast. , Disp: , Rfl:  .  zolpidem (AMBIEN) 10 MG  tablet, Take 1 tablet (10 mg total) by mouth at bedtime., Disp: 30 tablet, Rfl: 3 .  amoxicillin (AMOXIL) 500 MG capsule, Take 2,000 mg by mouth See admin instructions. Take 2,000 mg by mouth one hour before dental appointments, Disp: , Rfl:  .  EPIPEN 2-PAK 0.3 MG/0.3ML SOAJ injection, Inject 0.3 mg into the muscle once as needed for anaphylaxis. , Disp: , Rfl: 6 .  ondansetron (ZOFRAN) 8 MG tablet, Take 1 tablet (8 mg total) by mouth every 8 (eight) hours as needed for nausea or vomiting. (Patient not taking: Reported on 11/25/2019), Disp: 20 tablet, Rfl: 0 .  predniSONE (DELTASONE) 5 MG tablet, Label  & dispense according to the schedule below. take 8 Pills PO for 3 days, 6 Pills PO for 3 days, 4 Pills PO for 3 days, 2 Pills PO for 3 days, 1 Pills PO for 3 days, 1/2 Pill  PO for 3 days then STOP. Total 65 pills., Disp: 65 tablet, Rfl: 0 .  prochlorperazine (COMPAZINE) 10 MG tablet, Take 1 tablet (10 mg total) by mouth every 6 (six) hours as needed for nausea or vomiting. (Patient not taking: Reported on 11/25/2019), Disp: 30 tablet, Rfl: 1  Allergies:  Allergies  Allergen Reactions  . Bee Venom Anaphylaxis and Shortness Of  Breath    Past Medical History, Surgical history, Social history, and Family History were reviewed and updated.  Review of Systems: Review of Systems  Constitutional: Positive for fatigue.  HENT:  Negative.   Eyes: Negative.   Respiratory: Negative.   Cardiovascular: Negative.   Gastrointestinal: Negative.   Endocrine: Negative.   Genitourinary: Negative.    Musculoskeletal: Negative.   Skin: Negative.   Neurological: Negative.   Hematological: Negative.   Psychiatric/Behavioral: Negative.     Physical Exam:  weight is 131 lb (59.4 kg). Her temporal temperature is 97.5 F (36.4 C) (abnormal). Her blood pressure is 158/76 (abnormal) and her pulse is 69. Her respiration is 18 and oxygen saturation is 100%.   Wt Readings from Last 3 Encounters:  11/25/19 131 lb  (59.4 kg)  10/28/19 131 lb (59.4 kg)  10/10/19 136 lb 3.9 oz (61.8 kg)    Physical Exam Vitals reviewed.  HENT:     Head: Normocephalic and atraumatic.  Eyes:     Pupils: Pupils are equal, round, and reactive to light.  Cardiovascular:     Rate and Rhythm: Normal rate and regular rhythm.     Heart sounds: Normal heart sounds.  Pulmonary:     Effort: Pulmonary effort is normal.     Breath sounds: Normal breath sounds.  Abdominal:     General: Bowel sounds are normal.     Palpations: Abdomen is soft.  Musculoskeletal:        General: No tenderness or deformity. Normal range of motion.     Cervical back: Normal range of motion.  Lymphadenopathy:     Cervical: No cervical adenopathy.  Skin:    General: Skin is warm and dry.     Findings: No erythema or rash.  Neurological:     Mental Status: She is alert and oriented to person, place, and time.  Psychiatric:        Behavior: Behavior normal.        Thought Content: Thought content normal.        Judgment: Judgment normal.      Lab Results  Component Value Date   WBC 3.6 (L) 11/25/2019   HGB 8.2 (L) 11/25/2019   HCT 23.4 (L) 11/25/2019   MCV 98.3 11/25/2019   PLT 243 11/25/2019     Chemistry      Component Value Date/Time   NA 138 11/25/2019 0803   NA 133 02/13/2014 1108   K 3.8 11/25/2019 0803   K 4.9 (H) 02/13/2014 1108   CL 104 11/25/2019 0803   CL 97 (L) 02/13/2014 1108   CO2 26 11/25/2019 0803   CO2 32 02/13/2014 1108   BUN 12 11/25/2019 0803   BUN 13 02/13/2014 1108   CREATININE 0.64 11/25/2019 0803   CREATININE 0.4 (L) 02/13/2014 1108      Component Value Date/Time   CALCIUM 8.9 11/25/2019 0803   CALCIUM 9.3 02/13/2014 1108   ALKPHOS 52 11/25/2019 0803   ALKPHOS 63 02/13/2014 1108   AST 19 11/25/2019 0803   ALT 18 11/25/2019 0803   ALT 15 02/13/2014 1108   BILITOT 2.6 (H) 11/25/2019 0803       Impression and Plan: Christine [name] is a 73 year old white female.  She is a history of cold  agglutinin disease.  We treated her with Rituxan back in April 2015.  She tolerated this quite well.  She now appears to have a recurrence of the cold agglutinin disease.  Hopefully, we will start to see  her response to treatment.  We will see what her cold agglutinin titer is.  This will certainly help [place] out.  I know that with the winter weather that is about to happen, her hemolysis might be more apparent.  We will continue to follow her along monthly.  She will come back for her third cycle of treatment in January.  Volanda Napoleon, MD 12/14/20208:53 AM

## 2019-11-25 NOTE — Patient Instructions (Signed)
[facility] Discharge Instructions for Patients Receiving Chemotherapy  Today you received the following chemotherapy agents Rituxan and Bendeka  To help prevent nausea and vomiting after your treatment, we encourage you to take your nausea medication as prescribed by MD.   If you develop nausea and vomiting that is not controlled by your nausea medication, call the clinic.   BELOW ARE SYMPTOMS THAT SHOULD BE REPORTED IMMEDIATELY:  *FEVER GREATER THAN 100.5 F  *CHILLS WITH OR WITHOUT FEVER  NAUSEA AND VOMITING THAT IS NOT CONTROLLED WITH YOUR NAUSEA MEDICATION  *UNUSUAL SHORTNESS OF BREATH  *UNUSUAL BRUISING OR BLEEDING  TENDERNESS IN MOUTH AND THROAT WITH OR WITHOUT PRESENCE OF ULCERS  *URINARY PROBLEMS  *BOWEL PROBLEMS  UNUSUAL RASH Items with * indicate a potential emergency and should be followed up as soon as possible.  Feel free to call the clinic should you have any questions or concerns. The clinic phone number is [phone].  Please show the [facility] at check-in to the Emergency Department and triage nurse.

## 2019-11-26 ENCOUNTER — Inpatient Hospital Stay: Payer: Medicare Other

## 2019-11-26 VITALS — BP 141/61 | HR 73 | Temp 97.7°F

## 2019-11-26 DIAGNOSIS — Z5112 Encounter for antineoplastic immunotherapy: Secondary | ICD-10-CM | POA: Diagnosis not present

## 2019-11-26 DIAGNOSIS — D5912 Cold autoimmune hemolytic anemia: Secondary | ICD-10-CM

## 2019-11-26 LAB — COLD AGGLUTININ TITER: Cold Agglutinin Titer: 1:4096 {titer} — ABNORMAL HIGH

## 2019-11-26 MED ORDER — SODIUM CHLORIDE 0.9 % IV SOLN
72.0000 mg/m2 | Freq: Once | INTRAVENOUS | Status: AC
  Administered 2019-11-26: 125 mg via INTRAVENOUS
  Filled 2019-11-26: qty 5

## 2019-11-26 MED ORDER — DEXAMETHASONE SODIUM PHOSPHATE 10 MG/ML IJ SOLN
10.0000 mg | Freq: Once | INTRAMUSCULAR | Status: AC
  Administered 2019-11-26: 10 mg via INTRAVENOUS

## 2019-11-26 MED ORDER — SODIUM CHLORIDE 0.9 % IV SOLN
Freq: Once | INTRAVENOUS | Status: AC
  Filled 2019-11-26: qty 250

## 2019-11-26 MED ORDER — DEXAMETHASONE SODIUM PHOSPHATE 10 MG/ML IJ SOLN
INTRAMUSCULAR | Status: AC
  Filled 2019-11-26: qty 1

## 2019-11-26 NOTE — Patient Instructions (Signed)
Bendamustine Injection What is this medicine? BENDAMUSTINE (BEN da MUS teen) is a chemotherapy drug. It is used to treat chronic lymphocytic leukemia and non-Hodgkin lymphoma. This medicine may be used for other purposes; ask your health care provider or pharmacist if you have questions. COMMON BRAND NAME(S): BELRAPZO, BENDEKA, Treanda What should I tell my health care provider before I take this medicine? They need to know if you have any of these conditions:  infection (especially a virus infection such as chickenpox, cold sores, or herpes)  kidney disease  liver disease  an unusual or allergic reaction to bendamustine, mannitol, other medicines, foods, dyes, or preservatives  pregnant or trying to get pregnant  breast-feeding How should I use this medicine? This medicine is for infusion into a vein. It is given by a health care professional in a hospital or clinic setting. Talk to your pediatrician regarding the use of this medicine in children. Special care may be needed. Overdosage: If you think you have taken too much of this medicine contact a poison control center or emergency room at once. NOTE: This medicine is only for you. Do not share this medicine with others. What if I miss a dose? It is important not to miss your dose. Call your doctor or health care professional if you are unable to keep an appointment. What may interact with this medicine? Do not take this medicine with any of the following medications:  clozapine This medicine may also interact with the following medications:  atazanavir  cimetidine  ciprofloxacin  enoxacin  fluvoxamine  medicines for seizures like carbamazepine and phenobarbital  mexiletine  rifampin  tacrine  thiabendazole  zileuton This list may not describe all possible interactions. Give your health care provider a list of all the medicines, herbs, non-prescription drugs, or dietary supplements you use. Also tell them if  you smoke, drink alcohol, or use illegal drugs. Some items may interact with your medicine. What should I watch for while using this medicine? This drug may make you feel generally unwell. This is not uncommon, as chemotherapy can affect healthy cells as well as cancer cells. Report any side effects. Continue your course of treatment even though you feel ill unless your doctor tells you to stop. You may need blood work done while you are taking this medicine. Call your doctor or healthcare provider for advice if you get a fever, chills or sore throat, or other symptoms of a cold or flu. Do not treat yourself. This drug decreases your body's ability to fight infections. Try to avoid being around people who are sick. This medicine may cause serious skin reactions. They can happen weeks to months after starting the medicine. Contact your healthcare provider right away if you notice fevers or flu-like symptoms with a rash. The rash may be red or purple and then turn into blisters or peeling of the skin. Or, you might notice a red rash with swelling of the face, lips or lymph nodes in your neck or under your arms. This medicine may increase your risk to bruise or bleed. Call your doctor or healthcare provider if you notice any unusual bleeding. Talk to your doctor about your risk of cancer. You may be more at risk for certain types of cancers if you take this medicine. Do not become pregnant while taking this medicine or for at least 6 months after stopping it. Women should inform their doctor if they wish to become pregnant or think they might be pregnant. Men should not   father a child while taking this medicine and for at least 3 months after stopping it. There is a potential for serious side effects to an unborn child. Talk to your healthcare provider or pharmacist for more information. Do not breast-feed an infant while taking this medicine or for at least 1 week after stopping it. This medicine may make it  more difficult to father a child. You should talk with your doctor or healthcare provider if you are concerned about your fertility. What side effects may I notice from receiving this medicine? Side effects that you should report to your doctor or health care professional as soon as possible:  allergic reactions like skin rash, itching or hives, swelling of the face, lips, or tongue  low blood counts - this medicine may decrease the number of white blood cells, red blood cells and platelets. You may be at increased risk for infections and bleeding.  rash, fever, and swollen lymph nodes  redness, blistering, peeling, or loosening of the skin, including inside the mouth  signs of infection like fever or chills, cough, sore throat, pain or difficulty passing urine  signs of decreased platelets or bleeding like bruising, pinpoint red spots on the skin, black, tarry stools, blood in the urine  signs of decreased red blood cells like being unusually weak or tired, fainting spells, lightheadedness  signs and symptoms of kidney injury like trouble passing urine or change in the amount of urine  signs and symptoms of liver injury like dark yellow or brown urine; general ill feeling or flu-like symptoms; light-colored stools; loss of appetite; nausea; right upper belly pain; unusually weak or tired; yellowing of the eyes or skin Side effects that usually do not require medical attention (report to your doctor or health care professional if they continue or are bothersome):  constipation  decreased appetite  diarrhea  headache  mouth sores  nausea, vomiting  tiredness This list may not describe all possible side effects. Call your doctor for medical advice about side effects. You may report side effects to FDA at 1-800-FDA-1088. Where should I keep my medicine? This drug is given in a hospital or clinic and will not be stored at home. NOTE: This sheet is a summary. It may not cover all  possible information. If you have questions about this medicine, talk to your doctor, pharmacist, or health care provider.  2020 Elsevier/Gold Standard (2019-02-19 10:26:46)  

## 2019-12-19 ENCOUNTER — Ambulatory Visit: Payer: Medicare Other

## 2019-12-19 ENCOUNTER — Other Ambulatory Visit: Payer: Medicare Other

## 2019-12-19 ENCOUNTER — Ambulatory Visit: Payer: Medicare Other | Admitting: Hematology & Oncology

## 2019-12-20 ENCOUNTER — Ambulatory Visit: Payer: Medicare Other

## 2019-12-23 ENCOUNTER — Ambulatory Visit: Payer: Medicare Other | Admitting: Family

## 2019-12-23 ENCOUNTER — Other Ambulatory Visit: Payer: Medicare Other

## 2019-12-23 ENCOUNTER — Ambulatory Visit: Payer: Medicare Other

## 2019-12-24 ENCOUNTER — Ambulatory Visit: Payer: Medicare Other

## 2019-12-30 ENCOUNTER — Other Ambulatory Visit: Payer: Self-pay

## 2019-12-30 ENCOUNTER — Inpatient Hospital Stay: Payer: Medicare Other | Attending: Hematology & Oncology

## 2019-12-30 ENCOUNTER — Inpatient Hospital Stay: Payer: Medicare Other

## 2019-12-30 ENCOUNTER — Inpatient Hospital Stay (HOSPITAL_BASED_OUTPATIENT_CLINIC_OR_DEPARTMENT_OTHER): Payer: Medicare Other | Admitting: Family

## 2019-12-30 ENCOUNTER — Encounter: Payer: Self-pay | Admitting: Family

## 2019-12-30 VITALS — BP 136/62 | HR 66 | Temp 97.9°F | Resp 18 | Ht 65.0 in | Wt 132.1 lb

## 2019-12-30 VITALS — BP 130/81 | HR 63

## 2019-12-30 DIAGNOSIS — Z5112 Encounter for antineoplastic immunotherapy: Secondary | ICD-10-CM | POA: Insufficient documentation

## 2019-12-30 DIAGNOSIS — D5912 Cold autoimmune hemolytic anemia: Secondary | ICD-10-CM

## 2019-12-30 DIAGNOSIS — R5383 Other fatigue: Secondary | ICD-10-CM | POA: Diagnosis not present

## 2019-12-30 DIAGNOSIS — R110 Nausea: Secondary | ICD-10-CM | POA: Insufficient documentation

## 2019-12-30 DIAGNOSIS — U071 COVID-19: Secondary | ICD-10-CM | POA: Insufficient documentation

## 2019-12-30 DIAGNOSIS — Z79899 Other long term (current) drug therapy: Secondary | ICD-10-CM | POA: Diagnosis not present

## 2019-12-30 DIAGNOSIS — D509 Iron deficiency anemia, unspecified: Secondary | ICD-10-CM

## 2019-12-30 LAB — CMP (CANCER CENTER ONLY)
ALT: 24 U/L (ref 0–44)
AST: 19 U/L (ref 15–41)
Albumin: 4.3 g/dL (ref 3.5–5.0)
Alkaline Phosphatase: 53 U/L (ref 38–126)
Anion gap: 6 (ref 5–15)
BUN: 11 mg/dL (ref 8–23)
CO2: 29 mmol/L (ref 22–32)
Calcium: 9 mg/dL (ref 8.9–10.3)
Chloride: 105 mmol/L (ref 98–111)
Creatinine: 0.69 mg/dL (ref 0.44–1.00)
GFR, Est AFR Am: >60 mL/min (ref >60)
GFR, Estimated: >60 mL/min (ref >60)
Glucose, Bld: 90 mg/dL (ref 70–99)
Potassium: 4.1 mmol/L (ref 3.5–5.1)
Sodium: 140 mmol/L (ref 135–145)
Total Bilirubin: 2.1 mg/dL — ABNORMAL HIGH (ref 0.3–1.2)
Total Protein: 5.9 g/dL — ABNORMAL LOW (ref 6.5–8.1)

## 2019-12-30 LAB — CBC WITH DIFFERENTIAL (CANCER CENTER ONLY)
Abs Immature Granulocytes: 0.02 10*3/uL (ref 0.00–0.07)
Basophils Absolute: 0.1 10*3/uL (ref 0.0–0.1)
Basophils Relative: 2 %
Eosinophils Absolute: 0.1 10*3/uL (ref 0.0–0.5)
Eosinophils Relative: 3 %
HCT: 28.3 % — ABNORMAL LOW (ref 36.0–46.0)
Hemoglobin: 9.8 g/dL — ABNORMAL LOW (ref 12.0–15.0)
Immature Granulocytes: 1 %
Lymphocytes Relative: 7 %
Lymphs Abs: 0.2 10*3/uL — ABNORMAL LOW (ref 0.7–4.0)
MCH: 34.9 pg — ABNORMAL HIGH (ref 26.0–34.0)
MCHC: 34.6 g/dL (ref 30.0–36.0)
MCV: 100.7 fL — ABNORMAL HIGH (ref 80.0–100.0)
Monocytes Absolute: 0.3 10*3/uL (ref 0.1–1.0)
Monocytes Relative: 10 %
Neutro Abs: 2.4 10*3/uL (ref 1.7–7.7)
Neutrophils Relative %: 77 %
Platelet Count: 191 10*3/uL (ref 150–400)
RBC: 2.81 MIL/uL — ABNORMAL LOW (ref 3.87–5.11)
RDW: 15.2 % (ref 11.5–15.5)
WBC Count: 3 10*3/uL — ABNORMAL LOW (ref 4.0–10.5)
nRBC: 0 % (ref 0.0–0.2)

## 2019-12-30 LAB — IRON AND TIBC
Iron: 127 ug/dL (ref 41–142)
Saturation Ratios: 52 % (ref 21–57)
TIBC: 243 ug/dL (ref 236–444)
UIBC: 116 ug/dL — ABNORMAL LOW (ref 120–384)

## 2019-12-30 LAB — FERRITIN: Ferritin: 494 ng/mL — ABNORMAL HIGH (ref 11–307)

## 2019-12-30 LAB — RETIC PANEL
Immature Retic Fract: 17 % — ABNORMAL HIGH (ref 2.3–15.9)
RBC.: 2.76 MIL/uL — ABNORMAL LOW (ref 3.87–5.11)
Retic Count, Absolute: 135 10*3/uL (ref 19.0–186.0)
Retic Ct Pct: 4.9 % — ABNORMAL HIGH (ref 0.4–3.1)
Reticulocyte Hemoglobin: 36 pg (ref >27.9)

## 2019-12-30 LAB — LACTATE DEHYDROGENASE: LDH: 204 U/L — ABNORMAL HIGH (ref 98–192)

## 2019-12-30 MED ORDER — ACETAMINOPHEN 325 MG PO TABS
650.0000 mg | ORAL_TABLET | Freq: Once | ORAL | Status: AC
  Administered 2019-12-30: 650 mg via ORAL

## 2019-12-30 MED ORDER — SODIUM CHLORIDE 0.9 % IV SOLN
72.0000 mg/m2 | Freq: Once | INTRAVENOUS | Status: AC
  Administered 2019-12-30: 125 mg via INTRAVENOUS
  Filled 2019-12-30: qty 5

## 2019-12-30 MED ORDER — DEXAMETHASONE SODIUM PHOSPHATE 10 MG/ML IJ SOLN
INTRAMUSCULAR | Status: AC
  Filled 2019-12-30: qty 1

## 2019-12-30 MED ORDER — SODIUM CHLORIDE 0.9 % IV SOLN
375.0000 mg/m2 | Freq: Once | INTRAVENOUS | Status: AC
  Administered 2019-12-30: 11:00:00 600 mg via INTRAVENOUS
  Filled 2019-12-30: qty 50

## 2019-12-30 MED ORDER — DIPHENHYDRAMINE HCL 25 MG PO CAPS
50.0000 mg | ORAL_CAPSULE | Freq: Once | ORAL | Status: AC
  Administered 2019-12-30: 50 mg via ORAL

## 2019-12-30 MED ORDER — SODIUM CHLORIDE 0.9 % IV SOLN
Freq: Once | INTRAVENOUS | Status: AC
  Filled 2019-12-30: qty 250

## 2019-12-30 MED ORDER — SODIUM CHLORIDE 0.9% FLUSH
10.0000 mL | INTRAVENOUS | Status: DC | PRN
  Filled 2019-12-30: qty 10

## 2019-12-30 MED ORDER — PALONOSETRON HCL INJECTION 0.25 MG/5ML
0.2500 mg | Freq: Once | INTRAVENOUS | Status: AC
  Administered 2019-12-30: 0.25 mg via INTRAVENOUS

## 2019-12-30 MED ORDER — DIPHENHYDRAMINE HCL 25 MG PO CAPS
ORAL_CAPSULE | ORAL | Status: AC
  Filled 2019-12-30: qty 1

## 2019-12-30 MED ORDER — ACETAMINOPHEN 325 MG PO TABS
ORAL_TABLET | ORAL | Status: AC
  Filled 2019-12-30: qty 2

## 2019-12-30 MED ORDER — DEXAMETHASONE SODIUM PHOSPHATE 10 MG/ML IJ SOLN
10.0000 mg | Freq: Once | INTRAMUSCULAR | Status: AC
  Administered 2019-12-30: 10:00:00 10 mg via INTRAVENOUS

## 2019-12-30 MED ORDER — HEPARIN SOD (PORK) LOCK FLUSH 100 UNIT/ML IV SOLN
500.0000 [IU] | Freq: Once | INTRAVENOUS | Status: DC | PRN
  Filled 2019-12-30: qty 5

## 2019-12-30 MED ORDER — PALONOSETRON HCL INJECTION 0.25 MG/5ML
INTRAVENOUS | Status: AC
  Filled 2019-12-30: qty 5

## 2019-12-30 NOTE — Patient Instructions (Signed)
[facility] Discharge Instructions for Patients Receiving Chemotherapy  Today you received the following chemotherapy agents Rituxan and Bendeka  To help prevent nausea and vomiting after your treatment, we encourage you to take your nausea medication as prescribed by MD.   If you develop nausea and vomiting that is not controlled by your nausea medication, call the clinic.   BELOW ARE SYMPTOMS THAT SHOULD BE REPORTED IMMEDIATELY:  *FEVER GREATER THAN 100.5 F  *CHILLS WITH OR WITHOUT FEVER  NAUSEA AND VOMITING THAT IS NOT CONTROLLED WITH YOUR NAUSEA MEDICATION  *UNUSUAL SHORTNESS OF BREATH  *UNUSUAL BRUISING OR BLEEDING  TENDERNESS IN MOUTH AND THROAT WITH OR WITHOUT PRESENCE OF ULCERS  *URINARY PROBLEMS  *BOWEL PROBLEMS  UNUSUAL RASH Items with * indicate a potential emergency and should be followed up as soon as possible.  Feel free to call the clinic should you have any questions or concerns. The clinic phone number is [phone].  Please show the [facility] at check-in to the Emergency Department and triage nurse.

## 2019-12-30 NOTE — Progress Notes (Signed)
Hematology and Oncology Follow Up Visit  Christine [name]:[id number] [birth year] 74 y.o. 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 She is doing well but has had some fatigue and mild nausea after treatment.  Hgb is now 9.8, retic count 4.9, bilirubin 2.1.  She states that she is cold natured.  She is staying busy working and also caring for her husband who had a knee replacement last week.  No fever, vomiting, cough, rash, dizziness, SOB, chest pain, palpitations, abdominal pain or changes in bowel or bladder habits.  No swelling, tenderness, numbness or tingling in her extremities.  No falls or syncopal episodes to report.  She has maintained a good appetite and is staying well hydrated. Her weight is stable.   ECOG Performance Status: 1 - Symptomatic but completely ambulatory  Medications:  Allergies as of 12/30/2019      Reactions   Bee Venom Anaphylaxis, Shortness Of Breath      Medication List       Accurate as of December 30, 2019  8:50 AM. If you have any questions, ask your nurse or doctor.        STOP taking these medications   predniSONE 5 MG tablet Commonly known as: DELTASONE Stopped by: Laverna Peace, NP     TAKE these medications   acetaminophen 500 MG tablet Commonly known as: TYLENOL Take 500-1,000 mg by mouth every 6 (six) hours as needed for mild pain or headache.   amoxicillin 500 MG capsule Commonly known as: AMOXIL Take 2,000 mg by mouth See admin instructions. Take 2,000 mg by mouth one hour before dental appointments   BIOTIN PO Take 1 tablet by mouth daily with breakfast.   buPROPion 300 MG 24 hr tablet Commonly known as: WELLBUTRIN XL Take 300 mg by mouth every morning.   clobetasol 0.05 % external solution Commonly known as:  TEMOVATE   EpiPen 2-Pak 0.3 mg/0.3 mL Soaj injection Generic drug: EPINEPHrine Inject 0.3 mg into the muscle once as needed for anaphylaxis.   folic acid 1 MG tablet Commonly known as: FOLVITE Take 2 tablets (2 mg total) by mouth daily.   hydrochlorothiazide 12.5 MG capsule Commonly known as: MICROZIDE Take 12.5 mg by mouth daily.   LUTEIN PO Take 1 capsule by mouth daily with breakfast.   ondansetron 8 MG tablet Commonly known as: ZOFRAN Take 1 tablet (8 mg total) by mouth every 8 (eight) hours as needed for nausea or vomiting.   prochlorperazine 10 MG tablet Commonly known as: COMPAZINE Take 1 tablet (10 mg total) by mouth every 6 (six) hours as needed for nausea or vomiting.   Synthroid 88 MCG tablet Generic drug: levothyroxine Take 88 mcg by mouth daily before breakfast.   Vitamin D-3 25 MCG (1000 UT) Caps Take 1,000 Units by mouth 3 (three) times a week.   zolpidem 10 MG tablet Commonly known as: AMBIEN Take 1 tablet (10 mg total) by mouth at bedtime.       Allergies:  Allergies  Allergen Reactions  . Bee Venom Anaphylaxis and Shortness Of Breath    Past Medical History, Surgical history, Social history, and Family History were reviewed and updated.  Review of Systems: All other 10 point review of systems is negative.   Physical Exam:  vitals were not taken for this  visit.   Wt Readings from Last 3 Encounters:  11/25/19 131 lb (59.4 kg)  10/28/19 131 lb (59.4 kg)  10/10/19 136 lb 3.9 oz (61.8 kg)    Ocular: Sclerae unicteric, pupils equal, round and reactive to light Ear-nose-throat: Oropharynx clear, dentition fair Lymphatic: No cervical or supraclavicular adenopathy Lungs no rales or rhonchi, good excursion bilaterally Heart regular rate and rhythm, no murmur appreciated Abd soft, nontender, positive bowel sounds, no liver or spleen tip palpated on exam, no fluid wave  MSK no focal spinal tenderness, no joint edema Neuro: non-focal,  well-oriented, appropriate affect Breasts: Deferred   Lab Results  Component Value Date   WBC 3.0 (L) 12/30/2019   HGB 9.8 (L) 12/30/2019   HCT 28.3 (L) 12/30/2019   MCV 100.7 (H) 12/30/2019   PLT 191 12/30/2019   Lab Results  Component Value Date   FERRITIN 1,264 (H) 10/10/2019   IRON 59 10/08/2019   TIBC 195 (L) 10/08/2019   UIBC 136 10/08/2019   IRONPCTSAT 30 10/08/2019   Lab Results  Component Value Date   RETICCTPCT 4.9 (H) 12/30/2019   RBC 2.81 (L) 12/30/2019   RETICCTABS 155.3 11/23/2015   Lab Results  Component Value Date   KPAFRELGTCHN 128.5 (H) 10/08/2019   LAMBDASER 20.9 10/08/2019   KAPLAMBRATIO 6.15 (H) 10/08/2019   Lab Results  Component Value Date   IGGSERUM 648 10/08/2019   IGA 104 10/08/2019   IGMSERUM 454 (H) 10/08/2019   Lab Results  Component Value Date   TOTALPROTELP 5.5 (L) 10/08/2019   ALBUMINELP 3.2 10/08/2019   A1GS 0.3 10/08/2019   A2GS 0.5 10/08/2019   BETS 1.0 10/08/2019   GAMS 0.6 10/08/2019   MSPIKE Not Observed 10/08/2019   SPEI Comment 10/08/2019     Chemistry      Component Value Date/Time   NA 140 12/30/2019 0813   NA 133 02/13/2014 1108   K 4.1 12/30/2019 0813   K 4.9 (H) 02/13/2014 1108   CL 105 12/30/2019 0813   CL 97 (L) 02/13/2014 1108   CO2 29 12/30/2019 0813   CO2 32 02/13/2014 1108   BUN 11 12/30/2019 0813   BUN 13 02/13/2014 1108   CREATININE 0.69 12/30/2019 0813   CREATININE 0.4 (L) 02/13/2014 1108      Component Value Date/Time   CALCIUM 9.0 12/30/2019 0813   CALCIUM 9.3 02/13/2014 1108   ALKPHOS 53 12/30/2019 0813   ALKPHOS 63 02/13/2014 1108   AST 19 12/30/2019 0813   ALT 24 12/30/2019 0813   ALT 15 02/13/2014 1108   BILITOT 2.1 (H) 12/30/2019 0813       Impression and Plan: Christine [name] is a very pleasant 74 yo caucasian female with history of cold agglutinin disease previously treated with Rituxan in April 2015. She now has recurrence and is receiving Rituxan monthly.  We will proceed with  cycle 3 today as planned. We will see her back in another month for her fourth and final cycle of treatment.  She will contact our office with any questions or concerns. We can certainly see her sooner if needed.   Laverna Peace, NP 1/18/20218:50 AM

## 2019-12-31 ENCOUNTER — Inpatient Hospital Stay: Payer: Medicare Other

## 2019-12-31 VITALS — BP 146/63 | HR 60 | Temp 97.0°F

## 2019-12-31 DIAGNOSIS — D5912 Cold autoimmune hemolytic anemia: Secondary | ICD-10-CM | POA: Diagnosis not present

## 2019-12-31 MED ORDER — SODIUM CHLORIDE 0.9 % IV SOLN
72.0000 mg/m2 | Freq: Once | INTRAVENOUS | Status: AC
  Administered 2019-12-31: 125 mg via INTRAVENOUS
  Filled 2019-12-31: qty 5

## 2019-12-31 MED ORDER — DEXAMETHASONE SODIUM PHOSPHATE 10 MG/ML IJ SOLN
10.0000 mg | Freq: Once | INTRAMUSCULAR | Status: AC
  Administered 2019-12-31: 10 mg via INTRAVENOUS

## 2019-12-31 MED ORDER — SODIUM CHLORIDE 0.9 % IV SOLN
Freq: Once | INTRAVENOUS | Status: AC
  Filled 2019-12-31: qty 250

## 2019-12-31 MED ORDER — DEXAMETHASONE SODIUM PHOSPHATE 10 MG/ML IJ SOLN
INTRAMUSCULAR | Status: AC
  Filled 2019-12-31: qty 1

## 2019-12-31 NOTE — Progress Notes (Signed)
Patient complains of pain at infusion site.  Excellent blood return checked throughout infusion.  Warm blanket used for comfort.  .9 flows rapidly by gravity.  Bendeka slowed down to 100 cc/hr which helped ease the pain.  IV Dcd at completion.  Warm pack given to patient to put on site if needed.

## 2019-12-31 NOTE — Patient Instructions (Signed)
Bendamustine Injection What is this medicine? BENDAMUSTINE (BEN da MUS teen) is a chemotherapy drug. It is used to treat chronic lymphocytic leukemia and non-Hodgkin lymphoma. This medicine may be used for other purposes; ask your health care provider or pharmacist if you have questions. COMMON BRAND NAME(S): BELRAPZO, BENDEKA, Treanda What should I tell my health care provider before I take this medicine? They need to know if you have any of these conditions:  infection (especially a virus infection such as chickenpox, cold sores, or herpes)  kidney disease  liver disease  an unusual or allergic reaction to bendamustine, mannitol, other medicines, foods, dyes, or preservatives  pregnant or trying to get pregnant  breast-feeding How should I use this medicine? This medicine is for infusion into a vein. It is given by a health care professional in a hospital or clinic setting. Talk to your pediatrician regarding the use of this medicine in children. Special care may be needed. Overdosage: If you think you have taken too much of this medicine contact a poison control center or emergency room at once. NOTE: This medicine is only for you. Do not share this medicine with others. What if I miss a dose? It is important not to miss your dose. Call your doctor or health care professional if you are unable to keep an appointment. What may interact with this medicine? Do not take this medicine with any of the following medications:  clozapine This medicine may also interact with the following medications:  atazanavir  cimetidine  ciprofloxacin  enoxacin  fluvoxamine  medicines for seizures like carbamazepine and phenobarbital  mexiletine  rifampin  tacrine  thiabendazole  zileuton This list may not describe all possible interactions. Give your health care provider a list of all the medicines, herbs, non-prescription drugs, or dietary supplements you use. Also tell them if  you smoke, drink alcohol, or use illegal drugs. Some items may interact with your medicine. What should I watch for while using this medicine? This drug may make you feel generally unwell. This is not uncommon, as chemotherapy can affect healthy cells as well as cancer cells. Report any side effects. Continue your course of treatment even though you feel ill unless your doctor tells you to stop. You may need blood work done while you are taking this medicine. Call your doctor or healthcare provider for advice if you get a fever, chills or sore throat, or other symptoms of a cold or flu. Do not treat yourself. This drug decreases your body's ability to fight infections. Try to avoid being around people who are sick. This medicine may cause serious skin reactions. They can happen weeks to months after starting the medicine. Contact your healthcare provider right away if you notice fevers or flu-like symptoms with a rash. The rash may be red or purple and then turn into blisters or peeling of the skin. Or, you might notice a red rash with swelling of the face, lips or lymph nodes in your neck or under your arms. This medicine may increase your risk to bruise or bleed. Call your doctor or healthcare provider if you notice any unusual bleeding. Talk to your doctor about your risk of cancer. You may be more at risk for certain types of cancers if you take this medicine. Do not become pregnant while taking this medicine or for at least 6 months after stopping it. Women should inform their doctor if they wish to become pregnant or think they might be pregnant. Men should not   father a child while taking this medicine and for at least 3 months after stopping it. There is a potential for serious side effects to an unborn child. Talk to your healthcare provider or pharmacist for more information. Do not breast-feed an infant while taking this medicine or for at least 1 week after stopping it. This medicine may make it  more difficult to father a child. You should talk with your doctor or healthcare provider if you are concerned about your fertility. What side effects may I notice from receiving this medicine? Side effects that you should report to your doctor or health care professional as soon as possible:  allergic reactions like skin rash, itching or hives, swelling of the face, lips, or tongue  low blood counts - this medicine may decrease the number of white blood cells, red blood cells and platelets. You may be at increased risk for infections and bleeding.  rash, fever, and swollen lymph nodes  redness, blistering, peeling, or loosening of the skin, including inside the mouth  signs of infection like fever or chills, cough, sore throat, pain or difficulty passing urine  signs of decreased platelets or bleeding like bruising, pinpoint red spots on the skin, black, tarry stools, blood in the urine  signs of decreased red blood cells like being unusually weak or tired, fainting spells, lightheadedness  signs and symptoms of kidney injury like trouble passing urine or change in the amount of urine  signs and symptoms of liver injury like dark yellow or brown urine; general ill feeling or flu-like symptoms; light-colored stools; loss of appetite; nausea; right upper belly pain; unusually weak or tired; yellowing of the eyes or skin Side effects that usually do not require medical attention (report to your doctor or health care professional if they continue or are bothersome):  constipation  decreased appetite  diarrhea  headache  mouth sores  nausea, vomiting  tiredness This list may not describe all possible side effects. Call your doctor for medical advice about side effects. You may report side effects to FDA at 1-800-FDA-1088. Where should I keep my medicine? This drug is given in a hospital or clinic and will not be stored at home. NOTE: This sheet is a summary. It may not cover all  possible information. If you have questions about this medicine, talk to your doctor, pharmacist, or health care provider.  2020 Elsevier/Gold Standard (2019-02-19 10:26:46)  

## 2020-01-08 ENCOUNTER — Telehealth: Payer: Self-pay | Admitting: *Deleted

## 2020-01-08 NOTE — Telephone Encounter (Signed)
Received a call from [facility] today stating that she has experienced ringing in the ears since she started Rituxan and Bendamustine.  Spoke with Dr. Marin Olp who stated that he has not heard of tinnitus to be a problem with this regimen however he would recommend her being seen by an audiologist to have this checked out.  She also stated she has had nausea with this protocol.  Reviewed nausea meds she has and how to take them for next cycle.  She has not taken anything for nausea.

## 2020-01-19 ENCOUNTER — Ambulatory Visit: Payer: Medicare Other | Attending: Internal Medicine

## 2020-01-19 DIAGNOSIS — Z23 Encounter for immunization: Secondary | ICD-10-CM

## 2020-01-19 NOTE — Progress Notes (Signed)
   Covid-19 Vaccination Clinic  Name:  Christine [name]    MRN: [id number] DOB: October 01, 1946  01/19/2020  [name] was observed post Covid-19 immunization for 15 minutes without incidence. She was provided with Vaccine Information Sheet and instruction to access the V-Safe system.   Ms. Railing was instructed to call 911 with any severe reactions post vaccine: Marland Kitchen Difficulty breathing  . Swelling of your face and throat  . A fast heartbeat  . A bad rash all over your body  . Dizziness and weakness    Immunizations Administered    Name Date Dose VIS Date Route   Pfizer COVID-19 Vaccine 01/19/2020  4:21 PM 0.3 mL 11/22/2019 Intramuscular   Manufacturer: [place]   Lot: [id number]   [place]: [id number]

## 2020-01-28 ENCOUNTER — Inpatient Hospital Stay: Payer: Medicare Other

## 2020-01-28 ENCOUNTER — Encounter: Payer: Self-pay | Admitting: Hematology & Oncology

## 2020-01-28 ENCOUNTER — Other Ambulatory Visit: Payer: Self-pay

## 2020-01-28 ENCOUNTER — Inpatient Hospital Stay (HOSPITAL_BASED_OUTPATIENT_CLINIC_OR_DEPARTMENT_OTHER): Payer: Medicare Other | Admitting: Hematology & Oncology

## 2020-01-28 ENCOUNTER — Telehealth: Payer: Self-pay | Admitting: Hematology & Oncology

## 2020-01-28 ENCOUNTER — Inpatient Hospital Stay: Payer: Medicare Other | Attending: Hematology & Oncology

## 2020-01-28 VITALS — BP 160/72 | HR 77 | Temp 97.3°F | Resp 20 | Wt 132.8 lb

## 2020-01-28 VITALS — BP 147/67 | HR 67

## 2020-01-28 DIAGNOSIS — Z79899 Other long term (current) drug therapy: Secondary | ICD-10-CM | POA: Diagnosis not present

## 2020-01-28 DIAGNOSIS — D5912 Cold autoimmune hemolytic anemia: Secondary | ICD-10-CM | POA: Diagnosis present

## 2020-01-28 DIAGNOSIS — Z5112 Encounter for antineoplastic immunotherapy: Secondary | ICD-10-CM | POA: Diagnosis not present

## 2020-01-28 DIAGNOSIS — D509 Iron deficiency anemia, unspecified: Secondary | ICD-10-CM | POA: Insufficient documentation

## 2020-01-28 DIAGNOSIS — D649 Anemia, unspecified: Secondary | ICD-10-CM

## 2020-01-28 DIAGNOSIS — Z8616 Personal history of COVID-19: Secondary | ICD-10-CM | POA: Insufficient documentation

## 2020-01-28 LAB — CMP (CANCER CENTER ONLY)
ALT: 46 U/L — ABNORMAL HIGH (ref 0–44)
AST: 26 U/L (ref 15–41)
Albumin: 4.1 g/dL (ref 3.5–5.0)
Alkaline Phosphatase: 72 U/L (ref 38–126)
Anion gap: 7 (ref 5–15)
BUN: 17 mg/dL (ref 8–23)
CO2: 28 mmol/L (ref 22–32)
Calcium: 9.3 mg/dL (ref 8.9–10.3)
Chloride: 102 mmol/L (ref 98–111)
Creatinine: 0.82 mg/dL (ref 0.44–1.00)
GFR, Est AFR Am: >60 mL/min (ref >60)
GFR, Estimated: >60 mL/min (ref >60)
Glucose, Bld: 127 mg/dL — ABNORMAL HIGH (ref 70–99)
Potassium: 4.1 mmol/L (ref 3.5–5.1)
Sodium: 137 mmol/L (ref 135–145)
Total Bilirubin: 1.8 mg/dL — ABNORMAL HIGH (ref 0.3–1.2)
Total Protein: 6.1 g/dL — ABNORMAL LOW (ref 6.5–8.1)

## 2020-01-28 LAB — CBC WITH DIFFERENTIAL (CANCER CENTER ONLY)
Abs Immature Granulocytes: 0.22 10*3/uL — ABNORMAL HIGH (ref 0.00–0.07)
Basophils Absolute: 0.1 10*3/uL (ref 0.0–0.1)
Basophils Relative: 2 %
Eosinophils Absolute: 0.1 10*3/uL (ref 0.0–0.5)
Eosinophils Relative: 4 %
HCT: 26.1 % — ABNORMAL LOW (ref 36.0–46.0)
Hemoglobin: 9.3 g/dL — ABNORMAL LOW (ref 12.0–15.0)
Immature Granulocytes: 7 %
Lymphocytes Relative: 8 %
Lymphs Abs: 0.3 10*3/uL — ABNORMAL LOW (ref 0.7–4.0)
MCH: 33.8 pg (ref 26.0–34.0)
MCHC: 35.6 g/dL (ref 30.0–36.0)
MCV: 94.9 fL (ref 80.0–100.0)
Monocytes Absolute: 0.3 10*3/uL (ref 0.1–1.0)
Monocytes Relative: 10 %
Neutro Abs: 2.3 10*3/uL (ref 1.7–7.7)
Neutrophils Relative %: 69 %
Platelet Count: 225 10*3/uL (ref 150–400)
RBC: 2.75 MIL/uL — ABNORMAL LOW (ref 3.87–5.11)
RDW: 14.4 % (ref 11.5–15.5)
WBC Count: 3.3 10*3/uL — ABNORMAL LOW (ref 4.0–10.5)
nRBC: 0 % (ref 0.0–0.2)

## 2020-01-28 LAB — IRON AND TIBC
Iron: 123 ug/dL (ref 41–142)
Saturation Ratios: 47 % (ref 21–57)
TIBC: 264 ug/dL (ref 236–444)
UIBC: 141 ug/dL (ref 120–384)

## 2020-01-28 LAB — RETICULOCYTES
Immature Retic Fract: 26.9 % — ABNORMAL HIGH (ref 2.3–15.9)
RBC.: 2.49 MIL/uL — ABNORMAL LOW (ref 3.87–5.11)
Retic Count, Absolute: 88.9 10*3/uL (ref 19.0–186.0)
Retic Ct Pct: 3.6 % — ABNORMAL HIGH (ref 0.4–3.1)

## 2020-01-28 LAB — FERRITIN: Ferritin: 809 ng/mL — ABNORMAL HIGH (ref 11–307)

## 2020-01-28 LAB — LACTATE DEHYDROGENASE: LDH: 283 U/L — ABNORMAL HIGH (ref 98–192)

## 2020-01-28 MED ORDER — DIPHENHYDRAMINE HCL 50 MG/ML IJ SOLN
INTRAMUSCULAR | Status: AC
  Filled 2020-01-28: qty 1

## 2020-01-28 MED ORDER — DIPHENHYDRAMINE HCL 25 MG PO CAPS
50.0000 mg | ORAL_CAPSULE | Freq: Once | ORAL | Status: AC
  Administered 2020-01-28: 50 mg via ORAL

## 2020-01-28 MED ORDER — PALONOSETRON HCL INJECTION 0.25 MG/5ML
0.2500 mg | Freq: Once | INTRAVENOUS | Status: AC
  Administered 2020-01-28: 0.25 mg via INTRAVENOUS

## 2020-01-28 MED ORDER — SODIUM CHLORIDE 0.9 % IV SOLN
Freq: Once | INTRAVENOUS | Status: AC
  Filled 2020-01-28: qty 250

## 2020-01-28 MED ORDER — SODIUM CHLORIDE 0.9 % IV SOLN
375.0000 mg/m2 | Freq: Once | INTRAVENOUS | Status: AC
  Administered 2020-01-28: 600 mg via INTRAVENOUS
  Filled 2020-01-28: qty 50

## 2020-01-28 MED ORDER — ACETAMINOPHEN 500 MG PO TABS
ORAL_TABLET | ORAL | Status: AC
  Filled 2020-01-28: qty 2

## 2020-01-28 MED ORDER — DEXAMETHASONE SODIUM PHOSPHATE 10 MG/ML IJ SOLN
INTRAMUSCULAR | Status: AC
  Filled 2020-01-28: qty 1

## 2020-01-28 MED ORDER — SODIUM CHLORIDE 0.9 % IV SOLN: 375.0000 mg/m2 | Freq: Once | INTRAVENOUS | Status: DC

## 2020-01-28 MED ORDER — SODIUM CHLORIDE 0.9 % IV SOLN
72.0000 mg/m2 | Freq: Once | INTRAVENOUS | Status: AC
  Administered 2020-01-28: 125 mg via INTRAVENOUS
  Filled 2020-01-28: qty 5

## 2020-01-28 MED ORDER — DIPHENHYDRAMINE HCL 25 MG PO CAPS
ORAL_CAPSULE | ORAL | Status: AC
  Filled 2020-01-28: qty 2

## 2020-01-28 MED ORDER — PALONOSETRON HCL INJECTION 0.25 MG/5ML
INTRAVENOUS | Status: AC
  Filled 2020-01-28: qty 5

## 2020-01-28 MED ORDER — DEXAMETHASONE SODIUM PHOSPHATE 10 MG/ML IJ SOLN
10.0000 mg | Freq: Once | INTRAMUSCULAR | Status: AC
  Administered 2020-01-28: 10 mg via INTRAVENOUS

## 2020-01-28 MED ORDER — ACETAMINOPHEN 500 MG PO TABS
1000.0000 mg | ORAL_TABLET | Freq: Once | ORAL | Status: AC
  Administered 2020-01-28: 1000 mg via ORAL

## 2020-01-28 NOTE — Patient Instructions (Signed)
Rituximab injection What is this medicine? RITUXIMAB (ri TUX i mab) is a monoclonal antibody. It is used to treat certain types of cancer like non-Hodgkin lymphoma and chronic lymphocytic leukemia. It is also used to treat rheumatoid arthritis, granulomatosis with polyangiitis (or Wegener's granulomatosis), microscopic polyangiitis, and pemphigus vulgaris. This medicine may be used for other purposes; ask your health care provider or pharmacist if you have questions. COMMON BRAND NAME(S): Rituxan, RUXIENCE What should I tell my health care provider before I take this medicine? They need to know if you have any of these conditions:  heart disease  infection (especially a virus infection such as hepatitis B, chickenpox, cold sores, or herpes)  immune system problems  irregular heartbeat  kidney disease  low blood counts, like low white cell, platelet, or red cell counts  lung or breathing disease, like asthma  recently received or scheduled to receive a vaccine  an unusual or allergic reaction to rituximab, other medicines, foods, dyes, or preservatives  pregnant or trying to get pregnant  breast-feeding How should I use this medicine? This medicine is for infusion into a vein. It is administered in a hospital or clinic by a specially trained health care professional. A special MedGuide will be given to you by the pharmacist with each prescription and refill. Be sure to read this information carefully each time. Talk to your pediatrician regarding the use of this medicine in children. This medicine is not approved for use in children. Overdosage: If you think you have taken too much of this medicine contact a poison control center or emergency room at once. NOTE: This medicine is only for you. Do not share this medicine with others. What if I miss a dose? It is important not to miss a dose. Call your doctor or health care professional if you are unable to keep an appointment. What  may interact with this medicine?  cisplatin  live virus vaccines This list may not describe all possible interactions. Give your health care provider a list of all the medicines, herbs, non-prescription drugs, or dietary supplements you use. Also tell them if you smoke, drink alcohol, or use illegal drugs. Some items may interact with your medicine. What should I watch for while using this medicine? Your condition will be monitored carefully while you are receiving this medicine. You may need blood work done while you are taking this medicine. This medicine can cause serious allergic reactions. To reduce your risk you may need to take medicine before treatment with this medicine. Take your medicine as directed. In some patients, this medicine may cause a serious brain infection that may cause death. If you have any problems seeing, thinking, speaking, walking, or standing, tell your healthcare professional right away. If you cannot reach your healthcare professional, urgently seek other source of medical care. Call your doctor or health care professional for advice if you get a fever, chills or sore throat, or other symptoms of a cold or flu. Do not treat yourself. This drug decreases your body's ability to fight infections. Try to avoid being around people who are sick. Do not become pregnant while taking this medicine or for at least 12 months after stopping it. Women should inform their doctor if they wish to become pregnant or think they might be pregnant. There is a potential for serious side effects to an unborn child. Talk to your health care professional or pharmacist for more information. Do not breast-feed an infant while taking this medicine or for at   least 6 months after stopping it. What side effects may I notice from receiving this medicine? Side effects that you should report to your doctor or health care professional as soon as possible:  allergic reactions like skin rash, itching or  hives; swelling of the face, lips, or tongue  breathing problems  chest pain  changes in vision  diarrhea  headache with fever, neck stiffness, sensitivity to light, nausea, or confusion  fast, irregular heartbeat  loss of memory  low blood counts - this medicine may decrease the number of white blood cells, red blood cells and platelets. You may be at increased risk for infections and bleeding.  mouth sores  problems with balance, talking, or walking  redness, blistering, peeling or loosening of the skin, including inside the mouth  signs of infection - fever or chills, cough, sore throat, pain or difficulty passing urine  signs and symptoms of kidney injury like trouble passing urine or change in the amount of urine  signs and symptoms of liver injury like dark yellow or brown urine; general ill feeling or flu-like symptoms; light-colored stools; loss of appetite; nausea; right upper belly pain; unusually weak or tired; yellowing of the eyes or skin  signs and symptoms of low blood pressure like dizziness; feeling faint or lightheaded, falls; unusually weak or tired  stomach pain  swelling of the ankles, feet, hands  unusual bleeding or bruising  vomiting Side effects that usually do not require medical attention (report to your doctor or health care professional if they continue or are bothersome):  headache  joint pain  muscle cramps or muscle pain  nausea  tiredness This list may not describe all possible side effects. Call your doctor for medical advice about side effects. You may report side effects to FDA at 1-800-FDA-1088. Where should I keep my medicine? This drug is given in a hospital or clinic and will not be stored at home. NOTE: This sheet is a summary. It may not cover all possible information. If you have questions about this medicine, talk to your doctor, pharmacist, or health care provider.  2020 Elsevier/Gold Standard (2019-01-09  22:01:36) Bendamustine Injection What is this medicine? BENDAMUSTINE (BEN da MUS teen) is a chemotherapy drug. It is used to treat chronic lymphocytic leukemia and non-Hodgkin lymphoma. This medicine may be used for other purposes; ask your health care provider or pharmacist if you have questions. COMMON BRAND NAME(S): BELRAPZO, BENDEKA, Treanda What should I tell my health care provider before I take this medicine? They need to know if you have any of these conditions:  infection (especially a virus infection such as chickenpox, cold sores, or herpes)  kidney disease  liver disease  an unusual or allergic reaction to bendamustine, mannitol, other medicines, foods, dyes, or preservatives  pregnant or trying to get pregnant  breast-feeding How should I use this medicine? This medicine is for infusion into a vein. It is given by a health care professional in a hospital or clinic setting. Talk to your pediatrician regarding the use of this medicine in children. Special care may be needed. Overdosage: If you think you have taken too much of this medicine contact a poison control center or emergency room at once. NOTE: This medicine is only for you. Do not share this medicine with others. What if I miss a dose? It is important not to miss your dose. Call your doctor or health care professional if you are unable to keep an appointment. What may interact with this   medicine? Do not take this medicine with any of the following medications:  clozapine This medicine may also interact with the following medications:  atazanavir  cimetidine  ciprofloxacin  enoxacin  fluvoxamine  medicines for seizures like carbamazepine and phenobarbital  mexiletine  rifampin  tacrine  thiabendazole  zileuton This list may not describe all possible interactions. Give your health care provider a list of all the medicines, herbs, non-prescription drugs, or dietary supplements you use. Also tell  them if you smoke, drink alcohol, or use illegal drugs. Some items may interact with your medicine. What should I watch for while using this medicine? This drug may make you feel generally unwell. This is not uncommon, as chemotherapy can affect healthy cells as well as cancer cells. Report any side effects. Continue your course of treatment even though you feel ill unless your doctor tells you to stop. You may need blood work done while you are taking this medicine. Call your doctor or healthcare provider for advice if you get a fever, chills or sore throat, or other symptoms of a cold or flu. Do not treat yourself. This drug decreases your body's ability to fight infections. Try to avoid being around people who are sick. This medicine may cause serious skin reactions. They can happen weeks to months after starting the medicine. Contact your healthcare provider right away if you notice fevers or flu-like symptoms with a rash. The rash may be red or purple and then turn into blisters or peeling of the skin. Or, you might notice a red rash with swelling of the face, lips or lymph nodes in your neck or under your arms. This medicine may increase your risk to bruise or bleed. Call your doctor or healthcare provider if you notice any unusual bleeding. Talk to your doctor about your risk of cancer. You may be more at risk for certain types of cancers if you take this medicine. Do not become pregnant while taking this medicine or for at least 6 months after stopping it. Women should inform their doctor if they wish to become pregnant or think they might be pregnant. Men should not father a child while taking this medicine and for at least 3 months after stopping it. There is a potential for serious side effects to an unborn child. Talk to your healthcare provider or pharmacist for more information. Do not breast-feed an infant while taking this medicine or for at least 1 week after stopping it. This medicine may  make it more difficult to father a child. You should talk with your doctor or healthcare provider if you are concerned about your fertility. What side effects may I notice from receiving this medicine? Side effects that you should report to your doctor or health care professional as soon as possible:  allergic reactions like skin rash, itching or hives, swelling of the face, lips, or tongue  low blood counts - this medicine may decrease the number of white blood cells, red blood cells and platelets. You may be at increased risk for infections and bleeding.  rash, fever, and swollen lymph nodes  redness, blistering, peeling, or loosening of the skin, including inside the mouth  signs of infection like fever or chills, cough, sore throat, pain or difficulty passing urine  signs of decreased platelets or bleeding like bruising, pinpoint red spots on the skin, black, tarry stools, blood in the urine  signs of decreased red blood cells like being unusually weak or tired, fainting spells, lightheadedness  signs   and symptoms of kidney injury like trouble passing urine or change in the amount of urine  signs and symptoms of liver injury like dark yellow or brown urine; general ill feeling or flu-like symptoms; light-colored stools; loss of appetite; nausea; right upper belly pain; unusually weak or tired; yellowing of the eyes or skin Side effects that usually do not require medical attention (report to your doctor or health care professional if they continue or are bothersome):  constipation  decreased appetite  diarrhea  headache  mouth sores  nausea, vomiting  tiredness This list may not describe all possible side effects. Call your doctor for medical advice about side effects. You may report side effects to FDA at 1-800-FDA-1088. Where should I keep my medicine? This drug is given in a hospital or clinic and will not be stored at home. NOTE: This sheet is a summary. It may not  cover all possible information. If you have questions about this medicine, talk to your doctor, pharmacist, or health care provider.  2020 Elsevier/Gold Standard (2019-02-19 10:26:46)  
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
## 2020-01-28 NOTE — Telephone Encounter (Signed)
Appointments scheduled calendar printed per 2/16 los 

## 2020-01-28 NOTE — Progress Notes (Signed)
Lab parameters met for rapid infusion rituximab.  Change to rapid infusion per Dr. Marin Olp.  Patient agreeable.

## 2020-01-29 ENCOUNTER — Inpatient Hospital Stay: Payer: Medicare Other

## 2020-01-29 VITALS — BP 134/67 | HR 71 | Temp 97.1°F | Resp 20

## 2020-01-29 DIAGNOSIS — D5912 Cold autoimmune hemolytic anemia: Secondary | ICD-10-CM | POA: Diagnosis not present

## 2020-01-29 MED ORDER — SODIUM CHLORIDE 0.9 % IV SOLN
INTRAVENOUS | Status: DC
  Filled 2020-01-29: qty 250

## 2020-01-29 MED ORDER — DEXAMETHASONE SODIUM PHOSPHATE 10 MG/ML IJ SOLN
10.0000 mg | Freq: Once | INTRAMUSCULAR | Status: AC
  Administered 2020-01-29: 10 mg via INTRAVENOUS

## 2020-01-29 MED ORDER — DEXAMETHASONE SODIUM PHOSPHATE 10 MG/ML IJ SOLN
INTRAMUSCULAR | Status: AC
  Filled 2020-01-29: qty 1

## 2020-01-29 MED ORDER — SODIUM CHLORIDE 0.9 % IV SOLN
72.0000 mg/m2 | Freq: Once | INTRAVENOUS | Status: AC
  Administered 2020-01-29: 125 mg via INTRAVENOUS
  Filled 2020-01-29: qty 5

## 2020-01-29 NOTE — Patient Instructions (Signed)
Bendamustine Injection What is this medicine? BENDAMUSTINE (BEN da MUS teen) is a chemotherapy drug. It is used to treat chronic lymphocytic leukemia and non-Hodgkin lymphoma. This medicine may be used for other purposes; ask your health care provider or pharmacist if you have questions. COMMON BRAND NAME(S): Kristine Royal, Treanda What should I tell my health care provider before I take this medicine? They need to know if you have any of these conditions:  infection (especially a virus infection such as chickenpox, cold sores, or herpes)  kidney disease  liver disease  an unusual or allergic reaction to bendamustine, mannitol, other medicines, foods, dyes, or preservatives  pregnant or trying to get pregnant  breast-feeding How should I use this medicine? This medicine is for infusion into a vein. It is given by a health care professional in a hospital or clinic setting. Talk to your pediatrician regarding the use of this medicine in children. Special care may be needed. Overdosage: If you think you have taken too much of this medicine contact a poison control center or emergency room at once. NOTE: This medicine is only for you. Do not share this medicine with others. What if I miss a dose? It is important not to miss your dose. Call your doctor or health care professional if you are unable to keep an appointment. What may interact with this medicine? Do not take this medicine with any of the following medications:  clozapine This medicine may also interact with the following medications:  atazanavir  cimetidine  ciprofloxacin  enoxacin  fluvoxamine  medicines for seizures like carbamazepine and phenobarbital  mexiletine  rifampin  tacrine  thiabendazole  zileuton This list may not describe all possible interactions. Give your health care provider a list of all the medicines, herbs, non-prescription drugs, or dietary supplements you use. Also tell them if  you smoke, drink alcohol, or use illegal drugs. Some items may interact with your medicine. What should I watch for while using this medicine? This drug may make you feel generally unwell. This is not uncommon, as chemotherapy can affect healthy cells as well as cancer cells. Report any side effects. Continue your course of treatment even though you feel ill unless your doctor tells you to stop. You may need blood work done while you are taking this medicine. Call your doctor or healthcare provider for advice if you get a fever, chills or sore throat, or other symptoms of a cold or flu. Do not treat yourself. This drug decreases your body's ability to fight infections. Try to avoid being around people who are sick. This medicine may cause serious skin reactions. They can happen weeks to months after starting the medicine. Contact your healthcare provider right away if you notice fevers or flu-like symptoms with a rash. The rash may be red or purple and then turn into blisters or peeling of the skin. Or, you might notice a red rash with swelling of the face, lips or lymph nodes in your neck or under your arms. This medicine may increase your risk to bruise or bleed. Call your doctor or healthcare provider if you notice any unusual bleeding. Talk to your doctor about your risk of cancer. You may be more at risk for certain types of cancers if you take this medicine. Do not become pregnant while taking this medicine or for at least 6 months after stopping it. Women should inform their doctor if they wish to become pregnant or think they might be pregnant. Men should not  father a child while taking this medicine and for at least 3 months after stopping it. There is a potential for serious side effects to an unborn child. Talk to your healthcare provider or pharmacist for more information. Do not breast-feed an infant while taking this medicine or for at least 1 week after stopping it. This medicine may make it  more difficult to father a child. You should talk with your doctor or healthcare provider if you are concerned about your fertility. What side effects may I notice from receiving this medicine? Side effects that you should report to your doctor or health care professional as soon as possible:  allergic reactions like skin rash, itching or hives, swelling of the face, lips, or tongue  low blood counts - this medicine may decrease the number of white blood cells, red blood cells and platelets. You may be at increased risk for infections and bleeding.  rash, fever, and swollen lymph nodes  redness, blistering, peeling, or loosening of the skin, including inside the mouth  signs of infection like fever or chills, cough, sore throat, pain or difficulty passing urine  signs of decreased platelets or bleeding like bruising, pinpoint red spots on the skin, black, tarry stools, blood in the urine  signs of decreased red blood cells like being unusually weak or tired, fainting spells, lightheadedness  signs and symptoms of kidney injury like trouble passing urine or change in the amount of urine  signs and symptoms of liver injury like dark yellow or brown urine; general ill feeling or flu-like symptoms; light-colored stools; loss of appetite; nausea; right upper belly pain; unusually weak or tired; yellowing of the eyes or skin Side effects that usually do not require medical attention (report to your doctor or health care professional if they continue or are bothersome):  constipation  decreased appetite  diarrhea  headache  mouth sores  nausea, vomiting  tiredness This list may not describe all possible side effects. Call your doctor for medical advice about side effects. You may report side effects to FDA at 1-800-FDA-1088. Where should I keep my medicine? This drug is given in a hospital or clinic and will not be stored at home. NOTE: This sheet is a summary. It may not cover all  possible information. If you have questions about this medicine, talk to your doctor, pharmacist, or health care provider.  2020 Elsevier/Gold Standard (2019-02-19 10:26:46) Dexamethasone injection What is this medicine? DEXAMETHASONE (dex a METH a sone) is a corticosteroid. It is used to treat inflammation of the skin, joints, lungs, and other organs. Common conditions treated include asthma, allergies, and arthritis. It is also used for other conditions, like blood disorders and diseases of the adrenal glands. This medicine may be used for other purposes; ask your health care provider or pharmacist if you have questions. COMMON BRAND NAME(S): Decadron, DoubleDex, Simplist Dexamethasone, Solurex What should I tell my health care provider before I take this medicine? They need to know if you have any of these conditions:  Cushing's syndrome  diabetes  glaucoma  heart disease  high blood pressure  infection like herpes, measles, tuberculosis, or chickenpox  kidney disease  liver disease  mental illness  myasthenia gravis  osteoporosis  previous heart attack  seizures  stomach or intestine problems  thyroid disease  an unusual or allergic reaction to dexamethasone, corticosteroids, other medicines, lactose, foods, dyes, or preservatives  pregnant or trying to get pregnant  breast-feeding How should I use this medicine? This medicine  is for injection into a muscle, joint, lesion, soft tissue, or vein. It is given by a health care professional in a hospital or clinic setting. Talk to your pediatrician regarding the use of this medicine in children. Special care may be needed. Overdosage: If you think you have taken too much of this medicine contact a poison control center or emergency room at once. NOTE: This medicine is only for you. Do not share this medicine with others. What if I miss a dose? This may not apply. If you are having a series of injections over a  prolonged period, try not to miss an appointment. Call your doctor or health care professional to reschedule if you are unable to keep an appointment. What may interact with this medicine? Do not take this medicine with any of the following medications:  live virus vaccines This medicine may also interact with the following medications:  aminoglutethimide  amphotericin B  aspirin and aspirin-like medicines  certain antibiotics like erythromycin, clarithromycin, and troleandomycin  certain antivirals for HIV or hepatitis  certain medicines for seizures like carbamazepine, phenobarbital, phenytoin  certain medicines to treat myasthenia gravis  cholestyramine  cyclosporine  digoxin  diuretics  ephedrine  female hormones, like estrogen or progestins and birth control pills  insulin or other medicines for diabetes  isoniazid  ketoconazole  medicines that relax muscles for surgery  mifepristone  NSAIDs, medicines for pain and inflammation, like ibuprofen or naproxen  rifampin  skin tests for allergies  thalidomide  vaccines  warfarin This list may not describe all possible interactions. Give your health care provider a list of all the medicines, herbs, non-prescription drugs, or dietary supplements you use. Also tell them if you smoke, drink alcohol, or use illegal drugs. Some items may interact with your medicine. What should I watch for while using this medicine? Visit your health care professional for regular checks on your progress. Tell your health care professional if your symptoms do not start to get better or if they get worse. Your condition will be monitored carefully while you are receiving this medicine. Wear a medical ID bracelet or chain. Carry a card that describes your disease and details of your medicine and dosage times. This medicine may increase your risk of getting an infection. Call your health care professional for advice if you get a fever,  chills, or sore throat, or other symptoms of a cold or flu. Do not treat yourself. Try to avoid being around people who are sick. Call your health care professional if you are around anyone with measles, chickenpox, or if you develop sores or blisters that do not heal properly. If you are going to need surgery or other procedures, tell your doctor or health care professional that you have taken this medicine within the last 12 months. Ask your doctor or health care professional about your diet. You may need to lower the amount of salt you eat. This medicine may increase blood sugar. Ask your healthcare provider if changes in diet or medicines are needed if you have diabetes. What side effects may I notice from receiving this medicine? Side effects that you should report to your doctor or health care professional as soon as possible:  allergic reactions like skin rash, itching or hives, swelling of the face, lips, or tongue  bloody or black, tarry stools  changes in emotions or moods  changes in vision  confusion, excitement, restlessness  depressed mood  eye pain  hallucinations  muscle weakness  severe  or sudden stomach or belly pain  signs and symptoms of high blood sugar such as being more thirsty or hungry or having to urinate more than normal. You may also feel very tired or have blurry vision.  signs and symptoms of infection like fever; chills; cough; sore throat; pain or trouble passing urine  swelling of ankles, feet  unusual bruising or bleeding  wounds that do not heal Side effects that usually do not require medical attention (report to your doctor or health care professional if they continue or are bothersome):  increased appetite  increased growth of face or body hair  headache  nausea, vomiting  pain, redness, or irritation at site where injected  skin problems, acne, thin and shiny skin  trouble sleeping  weight gain This list may not describe all  possible side effects. Call your doctor for medical advice about side effects. You may report side effects to FDA at 1-800-FDA-1088. Where should I keep my medicine? This medicine is given in a hospital or clinic and will not be stored at home. NOTE: This sheet is a summary. It may not cover all possible information. If you have questions about this medicine, talk to your doctor, pharmacist, or health care provider.  2020 Elsevier/Gold Standard (2019-06-11 13:51:58)  

## 2020-02-06 IMAGING — CT CT CHEST W/ CM
2 of 5 series · 11 of 36 positions shown, 13 images · IV contrast (omnipaque)
Comparison: 02/13/2014.

CLINICAL DATA: 73-year-old with a lymphoproliferative disorder and
recurrent cold agglutinin hemolytic anemia. Patient underwent
Rituxan therapy in 5484. She has current symptoms of generalized
weakness, fatigue and orthostasis.

EXAM:
CT CHEST, ABDOMEN, AND PELVIS WITH CONTRAST
TECHNIQUE: Multidetector CT imaging of the chest, abdomen and pelvis was
performed following the standard protocol during bolus
administration of intravenous contrast.
CONTRAST:  100mL OMNIPAQUE IOHEXOL 300 MG/ML IV.

[Series 3: cap with 5mm st · axial · 0.89mm/px · z∈[+820,+1415]mm · 8 of 147 slices shown, 10 images]
[im 14/147  mediastinal]
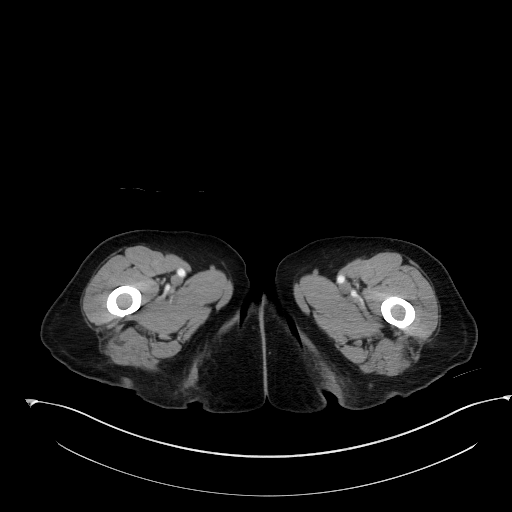
[im 14/147  lung]
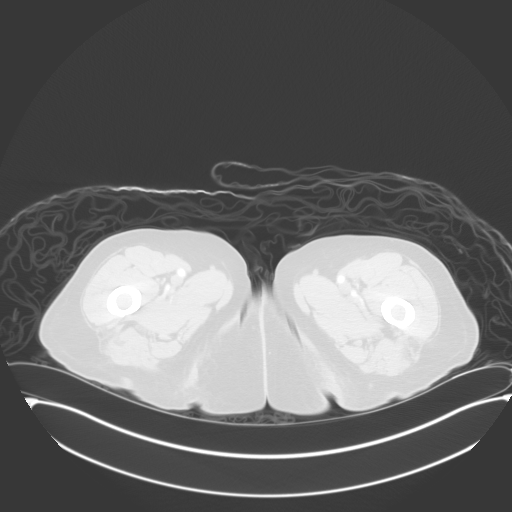
[im 27/147  lung]
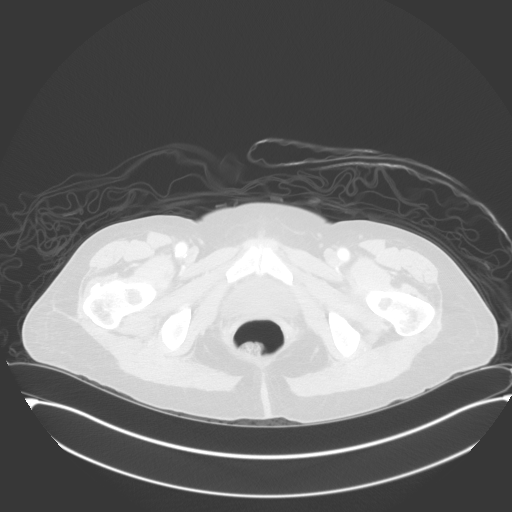
[im 54/147  lung]
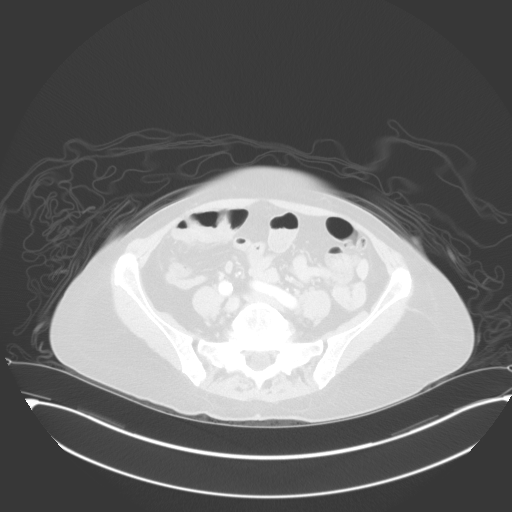
[im 67/147  lung]
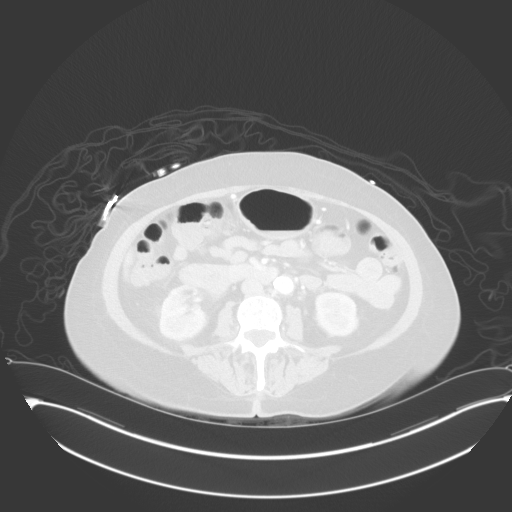
[im 80/147  mediastinal]
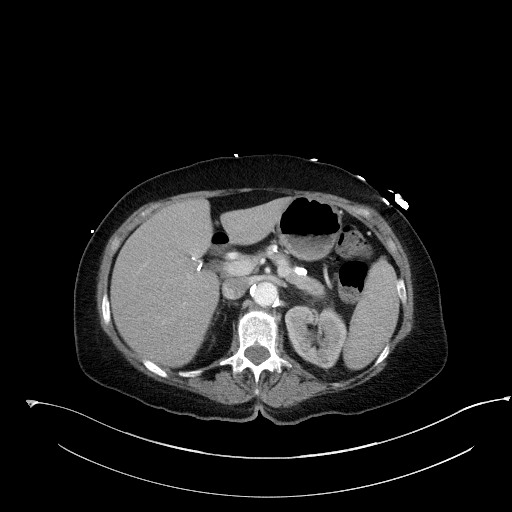
[im 80/147  lung]
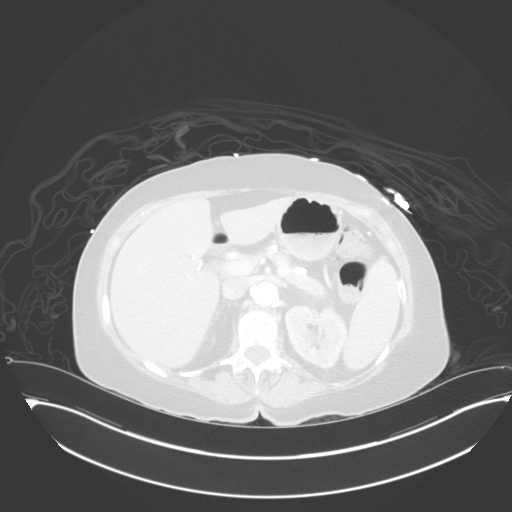
[im 93/147  lung]
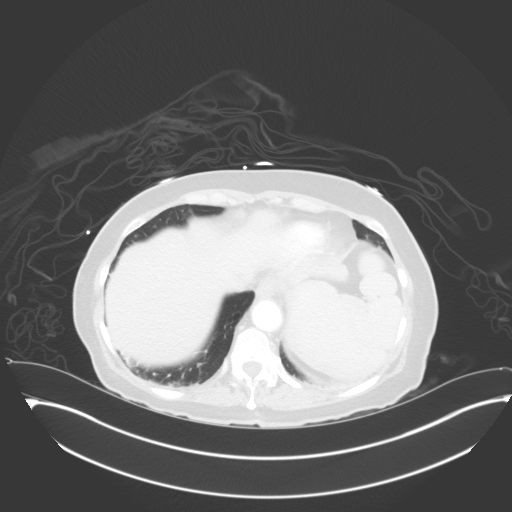
[im 120/147  lung]
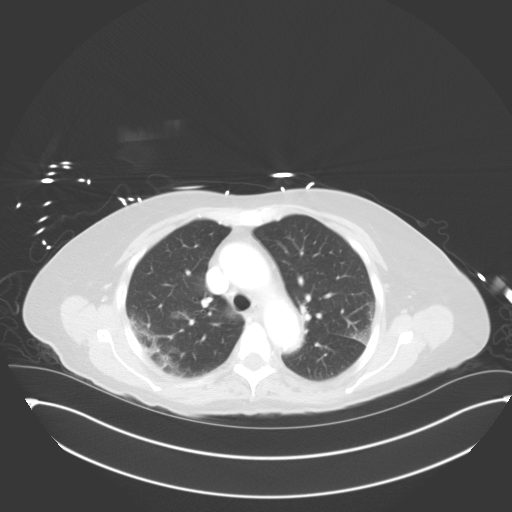
[im 133/147  lung]
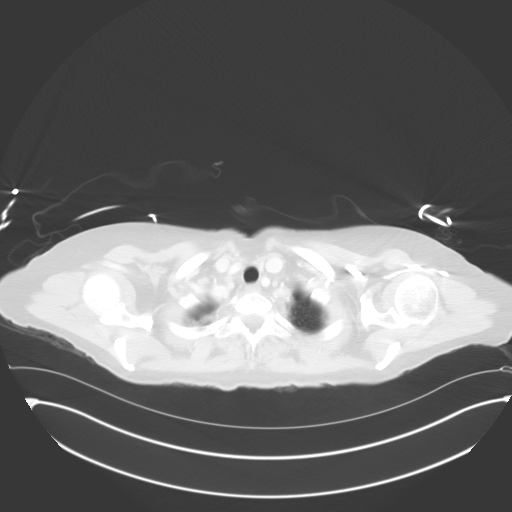

[Series 5: cap with 3mm st cor · coronal · 0.71mm/px · 3 of 144 slices shown]
[im 29/144  lung]
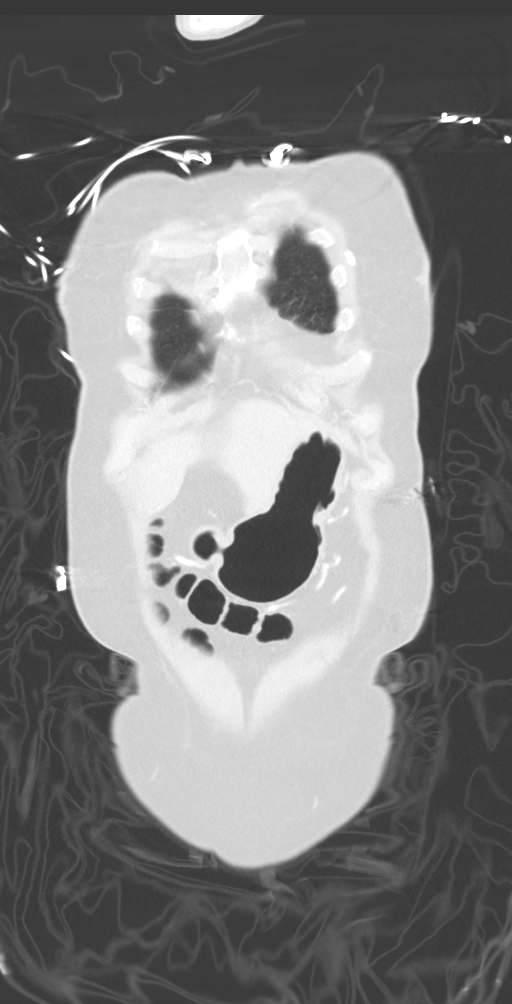
[im 58/144  lung]
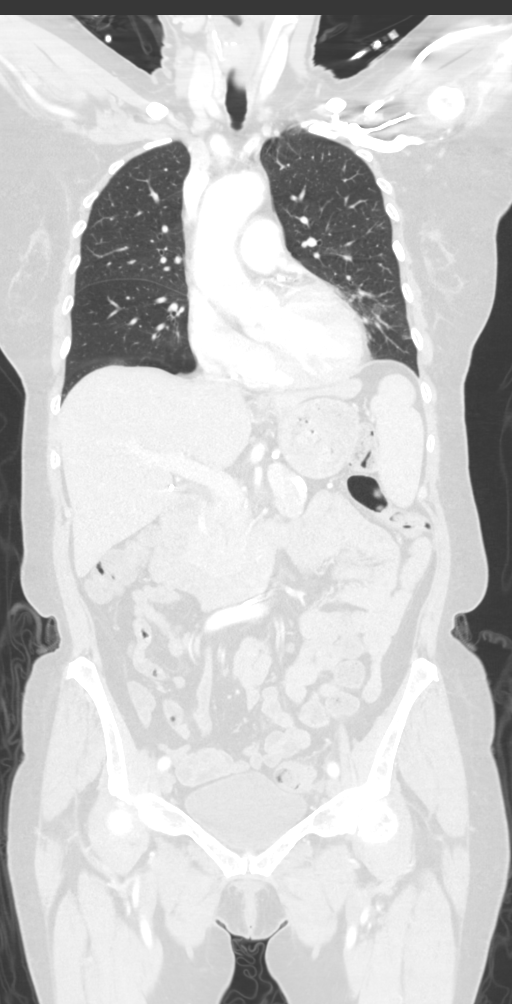
[im 86/144  lung]
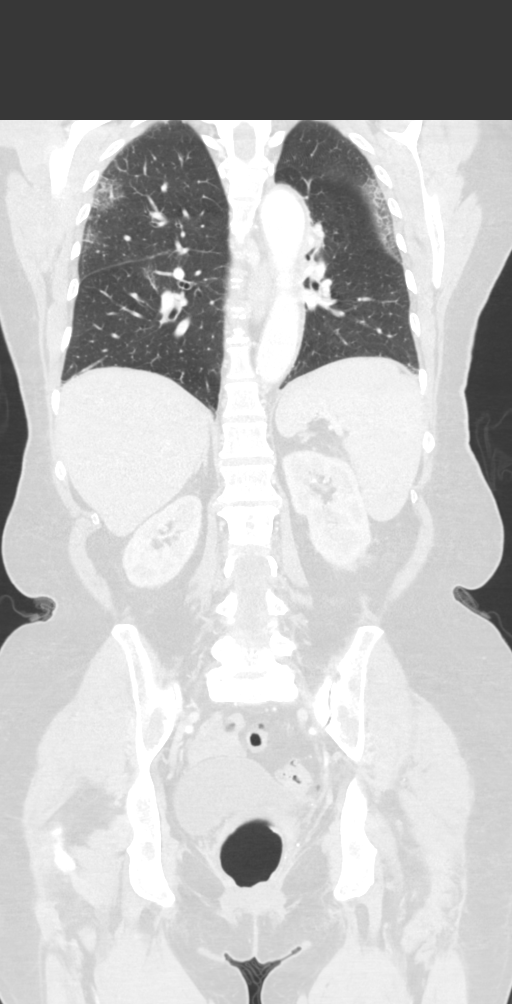

[11 of 36 positions shown; findings below may reference images not displayed]

FINDINGS: CT CHEST FINDINGS

Cardiovascular: Normal heart size. Severe three-vessel coronary
atherosclerosis. RIGHT coronary artery stent. No pericardial
effusion. Moderate atherosclerosis involving the thoracic aorta.
Ectatic ascending thoracic aorta measuring approximately 4.1 cm
diameter, only minimally increased in size since 5484 where it
measured 3.9 cm. Atherosclerosis at the origin of the LEFT
subclavian artery with possible hemodynamically significant
stenosis.

Mediastinum/Nodes: No pathologically enlarged mediastinal, hilar or
axillary lymph nodes. No mediastinal masses. Normal-appearing
esophagus. Normal-appearing thyroid gland.

Lungs/Pleura: Scattered ground-glass airspace opacities throughout
the UPPER lobes bilaterally. Blebs or air cysts in the RIGHT MIDDLE
LOBE and RIGHT LOWER LOBE. Expected mild dependent atelectasis
posteriorly in the lower lobes. No parenchymal nodules or masses. No
pleural effusions. Central airways patent without significant
bronchial wall thickening.

Musculoskeletal: Degenerative disc disease and spondylosis involving
the visualized lower cervical spine. Mild osseous demineralization.
No acute or significant abnormalities otherwise involving the
thoracic spine.

CT ABDOMEN PELVIS FINDINGS

Hepatobiliary: Liver normal in size and appearance. Surgically
absent gallbladder. No unexpected biliary ductal dilation.

Pancreas: Normal in appearance without evidence of mass, ductal
dilation, or inflammation.

Spleen: Moderate splenomegaly, unchanged since the 5484 CT. No focal
splenic parenchymal abnormality.

Adrenals/Urinary Tract: Normal appearing adrenal glands. Scarring
involving the LOWER pole of the RIGHT kidney, new since the 5484 CT.
No renal masses. No hydronephrosis. No urinary tract calculi. Normal
appearing urinary bladder.

Stomach/Bowel: Stomach normal in appearance for the degree of
distention. Normal-appearing small bowel. Elongated transverse colon
which extends low into the pelvis prior to extending back upward to
the splenic flexure. Descending and sigmoid colon diverticulosis,
extensive in the sigmoid region, without evidence of acute
diverticulitis. Normal appendix in the RIGHT mid pelvis.

Vascular/Lymphatic: Moderate aorto-iliofemoral atherosclerosis
without evidence of aneurysm. Normal-appearing portal venous and
systemic venous systems.

No pathologic lymphadenopathy.

Reproductive: Surgically absent uterus. No adnexal masses.

Other: None.

Musculoskeletal: Degenerative disc disease at L1-2, L4-5 and L5-S1.
No acute findings.
IMPRESSION: 1. No evidence of recurrent lymphoma in the chest, abdomen or
pelvis.
2. Scattered ground-glass airspace opacities throughout the UPPER
lobes bilaterally. There are a spectrum of findings in the lungs
which can be seen with acute atypical infection (as well as other
non-infectious etiologies). In particular, viral pneumonia
(including [id number]) should be considered in the appropriate
clinical setting.
3. No acute cardiopulmonary disease otherwise.
4. Ectatic ascending thoracic aorta measuring up to 4.1 cm diameter,
only minimally increased in size since 5484 where it measured
cm. Recommend annual imaging followup by CTA or MRA. This
recommendation follows 7757
ACCF/AHA/AATS/ACR/ASA/SCA/PELICULAS/NAKEISHA/NAVJOT/OZENILDO Guidelines for the
Diagnosis and Management of Patients with Thoracic Aortic Disease.
Circulation. 7757; 121: E266-e369.
5. Possible hemodynamically significant stenosis involving the
origin of the LEFT subclavian artery.
6. Stable moderate splenomegaly.
7. Descending and sigmoid colon diverticulosis without evidence of
acute diverticulitis.

Aortic Atherosclerosis (N0E09-YAU.U).

Aortic aneurysm NOS ([id number].C)

## 2020-02-12 ENCOUNTER — Ambulatory Visit: Payer: Medicare Other | Attending: Internal Medicine

## 2020-02-12 ENCOUNTER — Ambulatory Visit: Payer: Medicare Other

## 2020-02-12 DIAGNOSIS — Z23 Encounter for immunization: Secondary | ICD-10-CM

## 2020-02-12 NOTE — Progress Notes (Signed)
   Covid-19 Vaccination Clinic  Name:  Christine [name]    MRN: [id number] DOB: Jun 18, 1946  02/12/2020  [name] was observed post Covid-19 immunization for 15 minutes without incident. She was provided with Vaccine Information Sheet and instruction to access the V-Safe system.   [name] was instructed to call 911 with any severe reactions post vaccine: Marland Kitchen Difficulty breathing  . Swelling of face and throat  . A fast heartbeat  . A bad rash all over body  . Dizziness and weakness   Immunizations Administered    Name Date Dose VIS Date Route   Pfizer COVID-19 Vaccine 02/12/2020  3:26 PM 0.3 mL 11/22/2019 Intramuscular   Manufacturer: [place]   Lot: [id number]   [place]: [id number]

## 2020-02-24 ENCOUNTER — Inpatient Hospital Stay: Payer: Medicare Other | Attending: Hematology & Oncology

## 2020-02-24 ENCOUNTER — Other Ambulatory Visit: Payer: Self-pay

## 2020-02-24 ENCOUNTER — Inpatient Hospital Stay (HOSPITAL_BASED_OUTPATIENT_CLINIC_OR_DEPARTMENT_OTHER): Payer: Medicare Other | Admitting: Hematology & Oncology

## 2020-02-24 VITALS — BP 138/70 | HR 73 | Temp 97.7°F | Resp 16 | Wt 131.0 lb

## 2020-02-24 DIAGNOSIS — D649 Anemia, unspecified: Secondary | ICD-10-CM

## 2020-02-24 DIAGNOSIS — U071 COVID-19: Secondary | ICD-10-CM | POA: Insufficient documentation

## 2020-02-24 DIAGNOSIS — D5912 Cold autoimmune hemolytic anemia: Secondary | ICD-10-CM | POA: Insufficient documentation

## 2020-02-24 DIAGNOSIS — Z79899 Other long term (current) drug therapy: Secondary | ICD-10-CM | POA: Diagnosis not present

## 2020-02-24 LAB — RETICULOCYTES
Immature Retic Fract: 21 % — ABNORMAL HIGH (ref 2.3–15.9)
RBC.: 2.74 MIL/uL — ABNORMAL LOW (ref 3.87–5.11)
Retic Count, Absolute: 199.7 10*3/uL — ABNORMAL HIGH (ref 19.0–186.0)
Retic Ct Pct: 7.3 % — ABNORMAL HIGH (ref 0.4–3.1)

## 2020-02-24 LAB — CMP (CANCER CENTER ONLY)
ALT: 32 U/L (ref 0–44)
AST: 21 U/L (ref 15–41)
Albumin: 4.4 g/dL (ref 3.5–5.0)
Alkaline Phosphatase: 64 U/L (ref 38–126)
Anion gap: 9 (ref 5–15)
BUN: 15 mg/dL (ref 8–23)
CO2: 28 mmol/L (ref 22–32)
Calcium: 9.5 mg/dL (ref 8.9–10.3)
Chloride: 101 mmol/L (ref 98–111)
Creatinine: 0.76 mg/dL (ref 0.44–1.00)
GFR, Est AFR Am: >60 mL/min (ref >60)
GFR, Estimated: >60 mL/min (ref >60)
Glucose, Bld: 85 mg/dL (ref 70–99)
Potassium: 3.8 mmol/L (ref 3.5–5.1)
Sodium: 138 mmol/L (ref 135–145)
Total Bilirubin: 2.4 mg/dL — ABNORMAL HIGH (ref 0.3–1.2)
Total Protein: 6.3 g/dL — ABNORMAL LOW (ref 6.5–8.1)

## 2020-02-24 LAB — CBC WITH DIFFERENTIAL (CANCER CENTER ONLY)
Abs Immature Granulocytes: 0.14 10*3/uL — ABNORMAL HIGH (ref 0.00–0.07)
Basophils Absolute: 0 10*3/uL (ref 0.0–0.1)
Basophils Relative: 1 %
Eosinophils Absolute: 0.1 10*3/uL (ref 0.0–0.5)
Eosinophils Relative: 4 %
HCT: 26.6 % — ABNORMAL LOW (ref 36.0–46.0)
Hemoglobin: 9.1 g/dL — ABNORMAL LOW (ref 12.0–15.0)
Immature Granulocytes: 4 %
Lymphocytes Relative: 6 %
Lymphs Abs: 0.2 10*3/uL — ABNORMAL LOW (ref 0.7–4.0)
MCH: 32.7 pg (ref 26.0–34.0)
MCHC: 34.2 g/dL (ref 30.0–36.0)
MCV: 95.7 fL (ref 80.0–100.0)
Monocytes Absolute: 0.3 10*3/uL (ref 0.1–1.0)
Monocytes Relative: 8 %
Neutro Abs: 2.7 10*3/uL (ref 1.7–7.7)
Neutrophils Relative %: 77 %
Platelet Count: 228 10*3/uL (ref 150–400)
RBC: 2.78 MIL/uL — ABNORMAL LOW (ref 3.87–5.11)
RDW: 15.3 % (ref 11.5–15.5)
WBC Count: 3.5 10*3/uL — ABNORMAL LOW (ref 4.0–10.5)
nRBC: 0 % (ref 0.0–0.2)

## 2020-02-24 LAB — LACTATE DEHYDROGENASE: LDH: 268 U/L — ABNORMAL HIGH (ref 98–192)

## 2020-02-24 NOTE — Progress Notes (Signed)
Hematology and Oncology Follow Up Visit  Christine [name] [id number] 13-Jul-1946 74 y.o. 02/24/2020   Principle Diagnosis:   Cold Agglutinin Disease -- Recurrent  COVID-19 (+)  Current Therapy:    Rituxan/Bemdamustine -- s/p cycle #3-- started on 10/28/2019     Interim History:  Christine [name] is back for follow-up.  She really is holding her own.  She had a nice weekend.  She, thankfully, watch the [place].  She had no problem with fatigue.  She has had no nausea or vomiting.  There is been no cough.  She has had no fever.  She has had no obvious bleeding.  There is been no leg swelling.  She is still working at [employer]  that my wife likes to go to.  Overall, her performance status is ECOG 1.     Medications:  Current Outpatient Medications:  .  Cholecalciferol (VITAMIN D3 MAXIMUM STRENGTH) 125 MCG (5000 UT) capsule, Take 5,000 Units by mouth daily., Disp: , Rfl:  .  acetaminophen (TYLENOL) 500 MG tablet, Take 500-1,000 mg by mouth every 6 (six) hours as needed for mild pain or headache., Disp: , Rfl:  .  amoxicillin (AMOXIL) 500 MG capsule, Take 2,000 mg by mouth See admin instructions. Take 2,000 mg by mouth one hour before dental appointments, Disp: , Rfl:  .  BIOTIN PO, Take 1 tablet by mouth daily with breakfast., Disp: , Rfl:  .  buPROPion (WELLBUTRIN XL) 300 MG 24 hr tablet, Take 300 mg by mouth every morning. , Disp: , Rfl:  .  clobetasol (TEMOVATE) 0.05 % external solution, , Disp: , Rfl:  .  doxycycline (VIBRAMYCIN) 100 MG capsule, Take 100 mg by mouth 2 (two) times daily., Disp: , Rfl:  .  EPIPEN 2-PAK 0.3 MG/0.3ML SOAJ injection, Inject 0.3 mg into the muscle once as needed for anaphylaxis. , Disp: , Rfl: 6 .  finasteride (PROSCAR) 5 MG tablet, Take 2.5 mg by mouth daily., Disp: , Rfl:  .  folic acid (FOLVITE) 1 MG tablet, Take 2 tablets (2 mg total) by mouth daily., Disp: 180 tablet, Rfl: 3 .  hydrochlorothiazide (MICROZIDE) 12.5 MG  capsule, Take 12.5 mg by mouth daily., Disp: , Rfl: 11 .  LUTEIN PO, Take 1 capsule by mouth daily with breakfast., Disp: , Rfl:  .  ondansetron (ZOFRAN) 8 MG tablet, Take 1 tablet (8 mg total) by mouth every 8 (eight) hours as needed for nausea or vomiting., Disp: 20 tablet, Rfl: 0 .  prochlorperazine (COMPAZINE) 10 MG tablet, Take 1 tablet (10 mg total) by mouth every 6 (six) hours as needed for nausea or vomiting., Disp: 30 tablet, Rfl: 1 .  SYNTHROID 88 MCG tablet, Take 88 mcg by mouth daily before breakfast. , Disp: , Rfl:  .  zolpidem (AMBIEN) 10 MG tablet, Take 1 tablet (10 mg total) by mouth at bedtime., Disp: 30 tablet, Rfl: 3  Allergies:  Allergies  Allergen Reactions  . Bee Venom Anaphylaxis and Shortness Of Breath    Past Medical History, Surgical history, Social history, and Family History were reviewed and updated.  Review of Systems: Review of Systems  Constitutional: Positive for fatigue.  HENT:  Negative.   Eyes: Negative.   Respiratory: Negative.   Cardiovascular: Negative.   Gastrointestinal: Negative.   Endocrine: Negative.   Genitourinary: Negative.    Musculoskeletal: Negative.   Skin: Negative.   Neurological: Negative.   Hematological: Negative.   Psychiatric/Behavioral: Negative.     Physical Exam:  weight is 131 lb (59.4 kg). Her temporal temperature is 97.7 F (36.5 C). Her blood pressure is 138/70 and her pulse is 73. Her respiration is 16 and oxygen saturation is 100%.   Wt Readings from Last 3 Encounters:  02/24/20 131 lb (59.4 kg)  01/28/20 132 lb 12.8 oz (60.2 kg)  12/30/19 132 lb 1.9 oz (59.9 kg)    Physical Exam Vitals reviewed.  HENT:     Head: Normocephalic and atraumatic.  Eyes:     Pupils: Pupils are equal, round, and reactive to light.  Cardiovascular:     Rate and Rhythm: Normal rate and regular rhythm.     Heart sounds: Normal heart sounds.  Pulmonary:     Effort: Pulmonary effort is normal.     Breath sounds: Normal breath  sounds.  Abdominal:     General: Bowel sounds are normal.     Palpations: Abdomen is soft.  Musculoskeletal:        General: No tenderness or deformity. Normal range of motion.     Cervical back: Normal range of motion.  Lymphadenopathy:     Cervical: No cervical adenopathy.  Skin:    General: Skin is warm and dry.     Findings: No erythema or rash.  Neurological:     Mental Status: She is alert and oriented to person, place, and time.  Psychiatric:        Behavior: Behavior normal.        Thought Content: Thought content normal.        Judgment: Judgment normal.      Lab Results  Component Value Date   WBC 3.5 (L) 02/24/2020   HGB 9.1 (L) 02/24/2020   HCT 26.6 (L) 02/24/2020   MCV 95.7 02/24/2020   PLT 228 02/24/2020     Chemistry      Component Value Date/Time   NA 137 01/28/2020 0749   NA 133 02/13/2014 1108   K 4.1 01/28/2020 0749   K 4.9 (H) 02/13/2014 1108   CL 102 01/28/2020 0749   CL 97 (L) 02/13/2014 1108   CO2 28 01/28/2020 0749   CO2 32 02/13/2014 1108   BUN 17 01/28/2020 0749   BUN 13 02/13/2014 1108   CREATININE 0.82 01/28/2020 0749   CREATININE 0.4 (L) 02/13/2014 1108      Component Value Date/Time   CALCIUM 9.3 01/28/2020 0749   CALCIUM 9.3 02/13/2014 1108   ALKPHOS 72 01/28/2020 0749   ALKPHOS 63 02/13/2014 1108   AST 26 01/28/2020 0749   ALT 46 (H) 01/28/2020 0749   ALT 15 02/13/2014 1108   BILITOT 1.8 (H) 01/28/2020 0749       Impression and Plan: Christine [name] is a 74 year old white female.  She is a history of cold agglutinin disease.  We treated her with Rituxan back in April 2015.  She tolerated this quite well.  She now appears to have a recurrence of the cold agglutinin disease.  This may have been triggered by her having the COVID virus.  I and worried about the reticulocyte count being up again.  Typically, the reticulocyte count is a great way for [place] to tell how well things are working.  She is not symptomatic right now.  We  will just follow her.  Our options really are not that great.  I probably would consider Velcade with the next line of therapy.  I know that there is a monoclonal antibody- sutimlimab-that has been studied for cold agglutinin disease.  This has some affect.  Hopefully, the FDA might approve this.  Thankfully, she is not symptomatic.  I think if she exercises a little bit more, her cold agglutinin disease might improve.  I would like to see her back in another 4 weeks.  Volanda Napoleon, MD 3/15/202111:27 AM

## 2020-02-25 LAB — COLD AGGLUTININ TITER: Cold Agglutinin Titer: >1:4096 {titer}

## 2020-02-25 LAB — IRON AND TIBC
Iron: 214 ug/dL — ABNORMAL HIGH (ref 41–142)
Saturation Ratios: 84 % — ABNORMAL HIGH (ref 21–57)
TIBC: 255 ug/dL (ref 236–444)
UIBC: 41 ug/dL — ABNORMAL LOW (ref 120–384)

## 2020-02-25 LAB — FERRITIN: Ferritin: 537 ng/mL — ABNORMAL HIGH (ref 11–307)

## 2020-03-30 ENCOUNTER — Other Ambulatory Visit: Payer: Self-pay

## 2020-03-30 ENCOUNTER — Encounter: Payer: Self-pay | Admitting: Hematology & Oncology

## 2020-03-30 ENCOUNTER — Inpatient Hospital Stay: Payer: Medicare Other

## 2020-03-30 ENCOUNTER — Inpatient Hospital Stay: Payer: Medicare Other | Attending: Hematology & Oncology | Admitting: Hematology & Oncology

## 2020-03-30 VITALS — BP 138/78 | HR 72 | Temp 97.8°F | Resp 18 | Ht 65.0 in | Wt 132.8 lb

## 2020-03-30 DIAGNOSIS — D509 Iron deficiency anemia, unspecified: Secondary | ICD-10-CM | POA: Insufficient documentation

## 2020-03-30 DIAGNOSIS — Z8616 Personal history of COVID-19: Secondary | ICD-10-CM | POA: Diagnosis not present

## 2020-03-30 DIAGNOSIS — D5912 Cold autoimmune hemolytic anemia: Secondary | ICD-10-CM

## 2020-03-30 DIAGNOSIS — R5383 Other fatigue: Secondary | ICD-10-CM | POA: Insufficient documentation

## 2020-03-30 DIAGNOSIS — Z79899 Other long term (current) drug therapy: Secondary | ICD-10-CM | POA: Diagnosis not present

## 2020-03-30 LAB — CMP (CANCER CENTER ONLY)
ALT: 22 U/L (ref 0–44)
AST: 19 U/L (ref 15–41)
Albumin: 4.5 g/dL (ref 3.5–5.0)
Alkaline Phosphatase: 56 U/L (ref 38–126)
Anion gap: 8 (ref 5–15)
BUN: 16 mg/dL (ref 8–23)
CO2: 26 mmol/L (ref 22–32)
Calcium: 9.6 mg/dL (ref 8.9–10.3)
Chloride: 101 mmol/L (ref 98–111)
Creatinine: 0.69 mg/dL (ref 0.44–1.00)
GFR, Est AFR Am: >60 mL/min (ref >60)
GFR, Estimated: >60 mL/min (ref >60)
Glucose, Bld: 119 mg/dL — ABNORMAL HIGH (ref 70–99)
Potassium: 3.2 mmol/L — ABNORMAL LOW (ref 3.5–5.1)
Sodium: 135 mmol/L (ref 135–145)
Total Bilirubin: 2 mg/dL — ABNORMAL HIGH (ref 0.3–1.2)
Total Protein: 6.6 g/dL (ref 6.5–8.1)

## 2020-03-30 LAB — CBC WITH DIFFERENTIAL (CANCER CENTER ONLY)
Abs Immature Granulocytes: 0.04 10*3/uL (ref 0.00–0.07)
Basophils Absolute: 0 10*3/uL (ref 0.0–0.1)
Basophils Relative: 1 %
Eosinophils Absolute: 0.1 10*3/uL (ref 0.0–0.5)
Eosinophils Relative: 2 %
HCT: 30.8 % — ABNORMAL LOW (ref 36.0–46.0)
Hemoglobin: 10.7 g/dL — ABNORMAL LOW (ref 12.0–15.0)
Immature Granulocytes: 1 %
Lymphocytes Relative: 8 %
Lymphs Abs: 0.3 10*3/uL — ABNORMAL LOW (ref 0.7–4.0)
MCH: 32.7 pg (ref 26.0–34.0)
MCHC: 34.7 g/dL (ref 30.0–36.0)
MCV: 94.2 fL (ref 80.0–100.0)
Monocytes Absolute: 0.3 10*3/uL (ref 0.1–1.0)
Monocytes Relative: 8 %
Neutro Abs: 2.9 10*3/uL (ref 1.7–7.7)
Neutrophils Relative %: 80 %
Platelet Count: 210 10*3/uL (ref 150–400)
RBC: 3.27 MIL/uL — ABNORMAL LOW (ref 3.87–5.11)
RDW: 14.1 % (ref 11.5–15.5)
WBC Count: 3.6 10*3/uL — ABNORMAL LOW (ref 4.0–10.5)
nRBC: 0 % (ref 0.0–0.2)

## 2020-03-30 LAB — RETICULOCYTES
Immature Retic Fract: 11.6 % (ref 2.3–15.9)
RBC.: 3.29 MIL/uL — ABNORMAL LOW (ref 3.87–5.11)
Retic Count, Absolute: 120.7 10*3/uL (ref 19.0–186.0)
Retic Ct Pct: 3.7 % — ABNORMAL HIGH (ref 0.4–3.1)

## 2020-03-30 NOTE — Progress Notes (Signed)
Hematology and Oncology Follow Up Visit  Christine [name] [id number] [birth year] 74 y.o. 03/30/2020   Principle Diagnosis:   Cold Agglutinin Disease -- Recurrent  COVID-19 (+)  Current Therapy:    Rituxan/Bemdamustine -- s/p cycle #3-- started on 10/28/2019     Interim History:  [name] is back for follow-up.  She feels quite good.  She is exercising more.  She is still working.  She so far, has had a good Spring.  Over last saw her back in March, her cold agglutinin titer was greater than 1: 4096.  Reticulocyte count was up.  I was worried that she was relapsing.  She feels better today.  She feels that her hemoglobin is up.  She is absolutely right about this.  Her hemoglobin is 10.7 today.  Her reticulocyte count is down to 3.7%.  She has had no problems with bleeding.  There is no fever.  She has had no nausea or vomiting.  I think she probably has gone through the coronavirus infection that she had.  She had a very nice Easter.  Overall, her performance status is ECOG 0.  Medications:  Current Outpatient Medications:  .  acetaminophen (TYLENOL) 500 MG tablet, Take 500-1,000 mg by mouth every 6 (six) hours as needed for mild pain or headache., Disp: , Rfl:  .  amoxicillin (AMOXIL) 500 MG capsule, Take 2,000 mg by mouth See admin instructions. Take 2,000 mg by mouth one hour before dental appointments, Disp: , Rfl:  .  BIOTIN PO, Take 1 tablet by mouth daily with breakfast., Disp: , Rfl:  .  buPROPion (WELLBUTRIN XL) 300 MG 24 hr tablet, Take 300 mg by mouth every morning. , Disp: , Rfl:  .  Cholecalciferol (VITAMIN D3 MAXIMUM STRENGTH) 125 MCG (5000 UT) capsule, Take 5,000 Units by mouth daily., Disp: , Rfl:  .  clobetasol (TEMOVATE) 0.05 % external solution, , Disp: , Rfl:  .  doxycycline (VIBRAMYCIN) 100 MG capsule, Take 100 mg by mouth 2 (two) times daily., Disp: , Rfl:  .  EPIPEN 2-PAK 0.3 MG/0.3ML SOAJ injection, Inject 0.3 mg into the muscle once as needed for  anaphylaxis. , Disp: , Rfl: 6 .  finasteride (PROSCAR) 5 MG tablet, Take 2.5 mg by mouth daily., Disp: , Rfl:  .  folic acid (FOLVITE) 1 MG tablet, Take 2 tablets (2 mg total) by mouth daily., Disp: 180 tablet, Rfl: 3 .  hydrochlorothiazide (MICROZIDE) 12.5 MG capsule, Take 12.5 mg by mouth daily., Disp: , Rfl: 11 .  LUTEIN PO, Take 1 capsule by mouth daily with breakfast., Disp: , Rfl:  .  ondansetron (ZOFRAN) 8 MG tablet, Take 1 tablet (8 mg total) by mouth every 8 (eight) hours as needed for nausea or vomiting., Disp: 20 tablet, Rfl: 0 .  prochlorperazine (COMPAZINE) 10 MG tablet, Take 1 tablet (10 mg total) by mouth every 6 (six) hours as needed for nausea or vomiting., Disp: 30 tablet, Rfl: 1 .  SYNTHROID 88 MCG tablet, Take 88 mcg by mouth daily before breakfast. , Disp: , Rfl:  .  zolpidem (AMBIEN) 10 MG tablet, Take 1 tablet (10 mg total) by mouth at bedtime., Disp: 30 tablet, Rfl: 3  Allergies:  Allergies  Allergen Reactions  . Bee Venom Anaphylaxis and Shortness Of Breath    Past Medical History, Surgical history, Social history, and Family History were reviewed and updated.  Review of Systems: Review of Systems  Constitutional: Positive for fatigue.  HENT:  Negative.   Eyes:  Negative.   Respiratory: Negative.   Cardiovascular: Negative.   Gastrointestinal: Negative.   Endocrine: Negative.   Genitourinary: Negative.    Musculoskeletal: Negative.   Skin: Negative.   Neurological: Negative.   Hematological: Negative.   Psychiatric/Behavioral: Negative.     Physical Exam:  height is 5\' 5"  (1.651 m) and weight is 132 lb 12.8 oz (60.2 kg). Her temperature is 97.8 F (36.6 C). Her blood pressure is 138/78 and her pulse is 72. Her respiration is 18 and oxygen saturation is 100%.   Wt Readings from Last 3 Encounters:  03/30/20 132 lb 12.8 oz (60.2 kg)  02/24/20 131 lb (59.4 kg)  01/28/20 132 lb 12.8 oz (60.2 kg)    Physical Exam Vitals reviewed.  HENT:     Head:  Normocephalic and atraumatic.  Eyes:     Pupils: Pupils are equal, round, and reactive to light.  Cardiovascular:     Rate and Rhythm: Normal rate and regular rhythm.     Heart sounds: Normal heart sounds.  Pulmonary:     Effort: Pulmonary effort is normal.     Breath sounds: Normal breath sounds.  Abdominal:     General: Bowel sounds are normal.     Palpations: Abdomen is soft.  Musculoskeletal:        General: No tenderness or deformity. Normal range of motion.     Cervical back: Normal range of motion.  Lymphadenopathy:     Cervical: No cervical adenopathy.  Skin:    General: Skin is warm and dry.     Findings: No erythema or rash.  Neurological:     Mental Status: She is alert and oriented to person, place, and time.  Psychiatric:        Behavior: Behavior normal.        Thought Content: Thought content normal.        Judgment: Judgment normal.      Lab Results  Component Value Date   WBC 3.6 (L) 03/30/2020   HGB 10.7 (L) 03/30/2020   HCT 30.8 (L) 03/30/2020   MCV 94.2 03/30/2020   PLT 210 03/30/2020     Chemistry      Component Value Date/Time   NA 138 02/24/2020 1103   NA 133 02/13/2014 1108   K 3.8 02/24/2020 1103   K 4.9 (H) 02/13/2014 1108   CL 101 02/24/2020 1103   CL 97 (L) 02/13/2014 1108   CO2 28 02/24/2020 1103   CO2 32 02/13/2014 1108   BUN 15 02/24/2020 1103   BUN 13 02/13/2014 1108   CREATININE 0.76 02/24/2020 1103   CREATININE 0.4 (L) 02/13/2014 1108      Component Value Date/Time   CALCIUM 9.5 02/24/2020 1103   CALCIUM 9.3 02/13/2014 1108   ALKPHOS 64 02/24/2020 1103   ALKPHOS 63 02/13/2014 1108   AST 21 02/24/2020 1103   ALT 32 02/24/2020 1103   ALT 15 02/13/2014 1108   BILITOT 2.4 (H) 02/24/2020 1103       Impression and Plan: [name] is a 74 year old white female.  She is a history of cold agglutinin disease.  We treated her with Rituxan back in April 2015.  She tolerated this quite well.  With her hemoglobin better, I am  glad that we can just hold off on doing any intervention.  I know that there is a monoclonal antibody- sutimlimab-that has been studied for cold agglutinin disease.  This has some affect.  Hopefully, the FDA might approve this.  For  right now, we will plan to get her back in 4 months.  I had to believe that the warmer weather with Spring and Summer will be better for her blood.   Volanda Napoleon, MD 4/19/20213:00 PM

## 2020-03-31 LAB — LACTATE DEHYDROGENASE: LDH: 233 U/L — ABNORMAL HIGH (ref 98–192)

## 2020-03-31 LAB — COLD AGGLUTININ TITER: Cold Agglutinin Titer: >1:4096 {titer}

## 2020-03-31 LAB — IRON AND TIBC
Iron: 202 ug/dL — ABNORMAL HIGH (ref 41–142)
Saturation Ratios: 81 % — ABNORMAL HIGH (ref 21–57)
TIBC: 249 ug/dL (ref 236–444)
UIBC: 47 ug/dL — ABNORMAL LOW (ref 120–384)

## 2020-03-31 LAB — FERRITIN: Ferritin: 354 ng/mL — ABNORMAL HIGH (ref 11–307)

## 2020-08-03 ENCOUNTER — Inpatient Hospital Stay: Payer: Medicare Other | Admitting: Hematology & Oncology

## 2020-08-03 ENCOUNTER — Inpatient Hospital Stay: Payer: Medicare Other

## 2020-09-09 ENCOUNTER — Encounter: Payer: Self-pay | Admitting: Hematology & Oncology

## 2020-09-09 ENCOUNTER — Inpatient Hospital Stay: Payer: Medicare Other | Attending: Hematology & Oncology

## 2020-09-09 ENCOUNTER — Inpatient Hospital Stay (HOSPITAL_BASED_OUTPATIENT_CLINIC_OR_DEPARTMENT_OTHER): Payer: Medicare Other | Admitting: Hematology & Oncology

## 2020-09-09 ENCOUNTER — Other Ambulatory Visit: Payer: Self-pay

## 2020-09-09 VITALS — BP 127/68 | HR 68 | Temp 98.3°F | Resp 18 | Wt 135.0 lb

## 2020-09-09 DIAGNOSIS — D5912 Cold autoimmune hemolytic anemia: Secondary | ICD-10-CM | POA: Diagnosis present

## 2020-09-09 DIAGNOSIS — Z79899 Other long term (current) drug therapy: Secondary | ICD-10-CM | POA: Insufficient documentation

## 2020-09-09 LAB — CBC WITH DIFFERENTIAL (CANCER CENTER ONLY)
Abs Immature Granulocytes: 0.09 10*3/uL — ABNORMAL HIGH (ref 0.00–0.07)
Basophils Absolute: 0 10*3/uL (ref 0.0–0.1)
Basophils Relative: 1 %
Eosinophils Absolute: 0.1 10*3/uL (ref 0.0–0.5)
Eosinophils Relative: 3 %
HCT: 29.3 % — ABNORMAL LOW (ref 36.0–46.0)
Hemoglobin: 10.4 g/dL — ABNORMAL LOW (ref 12.0–15.0)
Immature Granulocytes: 3 %
Lymphocytes Relative: 12 %
Lymphs Abs: 0.4 10*3/uL — ABNORMAL LOW (ref 0.7–4.0)
MCH: 36 pg — ABNORMAL HIGH (ref 26.0–34.0)
MCHC: 35.5 g/dL (ref 30.0–36.0)
MCV: 101.4 fL — ABNORMAL HIGH (ref 80.0–100.0)
Monocytes Absolute: 0.4 10*3/uL (ref 0.1–1.0)
Monocytes Relative: 11 %
Neutro Abs: 2.5 10*3/uL (ref 1.7–7.7)
Neutrophils Relative %: 70 %
Platelet Count: 218 10*3/uL (ref 150–400)
RBC: 2.89 MIL/uL — ABNORMAL LOW (ref 3.87–5.11)
RDW: 14.9 % (ref 11.5–15.5)
WBC Count: 3.5 10*3/uL — ABNORMAL LOW (ref 4.0–10.5)
nRBC: 0 % (ref 0.0–0.2)

## 2020-09-09 LAB — CMP (CANCER CENTER ONLY)
ALT: 21 U/L (ref 0–44)
AST: 20 U/L (ref 15–41)
Albumin: 4.4 g/dL (ref 3.5–5.0)
Alkaline Phosphatase: 52 U/L (ref 38–126)
Anion gap: 8 (ref 5–15)
BUN: 18 mg/dL (ref 8–23)
CO2: 28 mmol/L (ref 22–32)
Calcium: 9.7 mg/dL (ref 8.9–10.3)
Chloride: 97 mmol/L — ABNORMAL LOW (ref 98–111)
Creatinine: 0.76 mg/dL (ref 0.44–1.00)
GFR, Est AFR Am: >60 mL/min (ref >60)
GFR, Estimated: >60 mL/min (ref >60)
Glucose, Bld: 71 mg/dL (ref 70–99)
Potassium: 3.6 mmol/L (ref 3.5–5.1)
Sodium: 133 mmol/L — ABNORMAL LOW (ref 135–145)
Total Bilirubin: 2.4 mg/dL — ABNORMAL HIGH (ref 0.3–1.2)
Total Protein: 6.5 g/dL (ref 6.5–8.1)

## 2020-09-09 LAB — RETICULOCYTES
Immature Retic Fract: 16.1 % — ABNORMAL HIGH (ref 2.3–15.9)
RBC.: 2.94 MIL/uL — ABNORMAL LOW (ref 3.87–5.11)
Retic Count, Absolute: 140.2 10*3/uL (ref 19.0–186.0)
Retic Ct Pct: 4.8 % — ABNORMAL HIGH (ref 0.4–3.1)

## 2020-09-09 NOTE — Progress Notes (Signed)
Hematology and Oncology Follow Up Visit  Christine [name] [id number] July 16, 1946 74 y.o. 09/09/2020   Principle Diagnosis:   Cold Agglutinin Disease -- Recurrent  COVID-19 (+)  Current Therapy:    Rituxan/Bemdamustine -- s/p cycle #4--completed on 01/29/2020      Interim History:  [name] is back for follow-up.  She feels quite good.  She is exercising more.  She is still working.  I see her whenever I go to [employer] which is a nice store in [place].  My wife likes to get close and other things from there.  She had a nice summer.   Her last cold agglutinin titer was greater than 1: 4096.  This is where it has been.  She has had no fatigue or weakness.  She has had no rashes.  There is been no leg swelling.  She has had no cough.  She is avoided the coronavirus.  She has had her vaccines.  Her last iron studies that we did back in April showed a ferritin of 354 with iron saturation of 81%.    Overall, her performance status is ECOG 0.  Medications:  Current Outpatient Medications:  .  acetaminophen (TYLENOL) 500 MG tablet, Take 500-1,000 mg by mouth every 6 (six) hours as needed for mild pain or headache., Disp: , Rfl:  .  amoxicillin (AMOXIL) 500 MG capsule, Take 2,000 mg by mouth See admin instructions. Take 2,000 mg by mouth one hour before dental appointments, Disp: , Rfl:  .  BIOTIN PO, Take 1 tablet by mouth daily with breakfast., Disp: , Rfl:  .  buPROPion (WELLBUTRIN XL) 300 MG 24 hr tablet, Take 300 mg by mouth every morning. , Disp: , Rfl:  .  Cholecalciferol (VITAMIN D3 MAXIMUM STRENGTH) 125 MCG (5000 UT) capsule, Take 5,000 Units by mouth daily., Disp: , Rfl:  .  clobetasol (TEMOVATE) 0.05 % external solution, , Disp: , Rfl:  .  EPIPEN 2-PAK 0.3 MG/0.3ML SOAJ injection, Inject 0.3 mg into the muscle once as needed for anaphylaxis. , Disp: , Rfl: 6 .  finasteride (PROSCAR) 5 MG tablet, Take 2.5 mg by mouth daily., Disp: , Rfl:  .  folic acid (FOLVITE) 1 MG  tablet, Take 2 tablets (2 mg total) by mouth daily., Disp: 180 tablet, Rfl: 3 .  hydrochlorothiazide (MICROZIDE) 12.5 MG capsule, Take 12.5 mg by mouth daily., Disp: , Rfl: 11 .  LUTEIN PO, Take 1 capsule by mouth daily with breakfast., Disp: , Rfl:  .  SYNTHROID 88 MCG tablet, Take 88 mcg by mouth daily before breakfast. , Disp: , Rfl:  .  zolpidem (AMBIEN) 10 MG tablet, Take 1 tablet (10 mg total) by mouth at bedtime., Disp: 30 tablet, Rfl: 3  Allergies:  Allergies  Allergen Reactions  . Bee Venom Anaphylaxis and Shortness Of Breath    Past Medical History, Surgical history, Social history, and Family History were reviewed and updated.  Review of Systems: Review of Systems  Constitutional: Positive for fatigue.  HENT:  Negative.   Eyes: Negative.   Respiratory: Negative.   Cardiovascular: Negative.   Gastrointestinal: Negative.   Endocrine: Negative.   Genitourinary: Negative.    Musculoskeletal: Negative.   Skin: Negative.   Neurological: Negative.   Hematological: Negative.   Psychiatric/Behavioral: Negative.     Physical Exam:  weight is 135 lb (61.2 kg). Her oral temperature is 98.3 F (36.8 C). Her blood pressure is 127/68 and her pulse is 68. Her respiration is 18 and oxygen  saturation is 98%.   Wt Readings from Last 3 Encounters:  09/09/20 135 lb (61.2 kg)  03/30/20 132 lb 12.8 oz (60.2 kg)  02/24/20 131 lb (59.4 kg)    Physical Exam Vitals reviewed.  HENT:     Head: Normocephalic and atraumatic.  Eyes:     Pupils: Pupils are equal, round, and reactive to light.  Cardiovascular:     Rate and Rhythm: Normal rate and regular rhythm.     Heart sounds: Normal heart sounds.  Pulmonary:     Effort: Pulmonary effort is normal.     Breath sounds: Normal breath sounds.  Abdominal:     General: Bowel sounds are normal.     Palpations: Abdomen is soft.  Musculoskeletal:        General: No tenderness or deformity. Normal range of motion.     Cervical back:  Normal range of motion.  Lymphadenopathy:     Cervical: No cervical adenopathy.  Skin:    General: Skin is warm and dry.     Findings: No erythema or rash.  Neurological:     Mental Status: She is alert and oriented to person, place, and time.  Psychiatric:        Behavior: Behavior normal.        Thought Content: Thought content normal.        Judgment: Judgment normal.      Lab Results  Component Value Date   WBC 3.5 (L) 09/09/2020   HGB 10.4 (L) 09/09/2020   HCT 29.3 (L) 09/09/2020   MCV 101.4 (H) 09/09/2020   PLT 218 09/09/2020     Chemistry      Component Value Date/Time   NA 133 (L) 09/09/2020 1452   NA 133 02/13/2014 1108   K 3.6 09/09/2020 1452   K 4.9 (H) 02/13/2014 1108   CL 97 (L) 09/09/2020 1452   CL 97 (L) 02/13/2014 1108   CO2 28 09/09/2020 1452   CO2 32 02/13/2014 1108   BUN 18 09/09/2020 1452   BUN 13 02/13/2014 1108   CREATININE 0.76 09/09/2020 1452   CREATININE 0.4 (L) 02/13/2014 1108      Component Value Date/Time   CALCIUM 9.7 09/09/2020 1452   CALCIUM 9.3 02/13/2014 1108   ALKPHOS 52 09/09/2020 1452   ALKPHOS 63 02/13/2014 1108   AST 20 09/09/2020 1452   ALT 21 09/09/2020 1452   ALT 15 02/13/2014 1108   BILITOT 2.4 (H) 09/09/2020 1452       Impression and Plan: [name] is a 74 year old white female.  She is a history of cold agglutinin disease.  We treated her with Rituxan back in April 2015.  She tolerated this quite well.  She then had a relapse.  We went ahead and treated her with Rituxan/bendamustine.  She had 4 cycles of treatment thus completed in February 2021.  Erythematous pretty steady with her hemoglobin.  I am sure that the warm weather probably has some to do with this.  We will plan to get her back in 6 months.  I think we can probably go 6 months.  If she has any problems, hopefully, the new monoclonal antibody- sutimlimab-that has been studied for cold agglutinin disease -will be approved.Marland Kitchen     Volanda Napoleon,  MD 9/29/20213:49 PM

## 2020-09-10 ENCOUNTER — Telehealth: Payer: Self-pay | Admitting: Hematology & Oncology

## 2020-09-10 LAB — COLD AGGLUTININ TITER: Cold Agglutinin Titer: >1:4096 {titer}

## 2020-09-10 NOTE — Telephone Encounter (Signed)
Appointments scheduled calendar printed & mailed per 9/29 los 

## 2020-10-05 ENCOUNTER — Other Ambulatory Visit: Payer: Self-pay | Admitting: Hematology & Oncology

## 2020-10-05 DIAGNOSIS — D500 Iron deficiency anemia secondary to blood loss (chronic): Secondary | ICD-10-CM

## 2020-12-23 ENCOUNTER — Ambulatory Visit: Payer: Medicare Other | Admitting: Podiatry

## 2020-12-23 ENCOUNTER — Encounter: Payer: Self-pay | Admitting: Podiatry

## 2020-12-23 ENCOUNTER — Ambulatory Visit (INDEPENDENT_AMBULATORY_CARE_PROVIDER_SITE_OTHER): Payer: Medicare Other

## 2020-12-23 ENCOUNTER — Other Ambulatory Visit: Payer: Self-pay

## 2020-12-23 DIAGNOSIS — M779 Enthesopathy, unspecified: Secondary | ICD-10-CM

## 2020-12-23 DIAGNOSIS — L84 Corns and callosities: Secondary | ICD-10-CM

## 2020-12-23 DIAGNOSIS — M21619 Bunion of unspecified foot: Secondary | ICD-10-CM | POA: Diagnosis not present

## 2020-12-23 DIAGNOSIS — M79671 Pain in right foot: Secondary | ICD-10-CM

## 2020-12-23 DIAGNOSIS — M2041 Other hammer toe(s) (acquired), right foot: Secondary | ICD-10-CM

## 2020-12-23 DIAGNOSIS — M79672 Pain in left foot: Secondary | ICD-10-CM | POA: Diagnosis not present

## 2020-12-23 NOTE — Progress Notes (Signed)
Subjective:   Patient ID: Christine [name], female   DOB: 75 y.o.   MRN: [id number]   HPI Patient presents stating over the last 6 months she has developed a lot of pain in the second toe of her right foot and it has lifted quite a bit over the left second toe.  States it is becoming more involved and more painful for her and she is having increased difficulty wearing shoe gear comfortably   ROS      Objective:  Physical Exam  Neurovascular status intact negative Bevelyn Buckles' sign was noted with patient found to have good digital perfusion well oriented.  Patient is found to have rigid contracture digit to right with inflammatory changes of the proximal to phalangeal joint and deformity of the third and fourth digit right along with bunion deformity right.  The left shows deformity but not to the same degree     Assessment:  Possibility for flexor plate dislocation second MPJ right with inflammatory capsulitis proximal joint digit to right keratotic lesion formation and also bunion formation hammertoe deformity right over left     Plan:  H&P reviewed conditions and I did a proximal block of the right second toe.  I then carefully injected the inner phalangeal joint 2 mg dexamethasone Kenalog debrided the lesion applied padding to the area and educated her on surgical intervention.  Would require digital fusion along with probable McBride bunionectomy  X-rays indicate that there is significant forefoot deformity right over left with rigid contracture digit to right structural bunion deformity right over left

## 2021-03-05 ENCOUNTER — Telehealth: Payer: Self-pay

## 2021-03-05 NOTE — Telephone Encounter (Signed)
Pt called in to r/s her appt due to a conflict at work    [employer]

## 2021-03-09 ENCOUNTER — Other Ambulatory Visit: Payer: Medicare Other

## 2021-03-09 ENCOUNTER — Ambulatory Visit: Payer: Medicare Other | Admitting: Hematology & Oncology

## 2021-03-12 ENCOUNTER — Other Ambulatory Visit: Payer: Self-pay | Admitting: Cardiovascular Disease

## 2021-03-12 ENCOUNTER — Other Ambulatory Visit: Payer: Self-pay | Admitting: Internal Medicine

## 2021-03-12 DIAGNOSIS — I7090 Unspecified atherosclerosis: Secondary | ICD-10-CM

## 2021-04-05 ENCOUNTER — Encounter: Payer: Self-pay | Admitting: Hematology & Oncology

## 2021-04-05 ENCOUNTER — Other Ambulatory Visit: Payer: Self-pay

## 2021-04-05 ENCOUNTER — Telehealth: Payer: Self-pay

## 2021-04-05 ENCOUNTER — Inpatient Hospital Stay: Payer: Medicare Other | Attending: Hematology & Oncology

## 2021-04-05 ENCOUNTER — Inpatient Hospital Stay (HOSPITAL_BASED_OUTPATIENT_CLINIC_OR_DEPARTMENT_OTHER): Payer: Medicare Other | Admitting: Hematology & Oncology

## 2021-04-05 VITALS — BP 129/60 | HR 64 | Temp 98.2°F | Resp 18 | Wt 136.0 lb

## 2021-04-05 DIAGNOSIS — D5912 Cold autoimmune hemolytic anemia: Secondary | ICD-10-CM

## 2021-04-05 DIAGNOSIS — Z79899 Other long term (current) drug therapy: Secondary | ICD-10-CM | POA: Insufficient documentation

## 2021-04-05 DIAGNOSIS — Z9221 Personal history of antineoplastic chemotherapy: Secondary | ICD-10-CM | POA: Diagnosis not present

## 2021-04-05 DIAGNOSIS — D509 Iron deficiency anemia, unspecified: Secondary | ICD-10-CM | POA: Insufficient documentation

## 2021-04-05 DIAGNOSIS — Z8616 Personal history of COVID-19: Secondary | ICD-10-CM | POA: Diagnosis not present

## 2021-04-05 LAB — CMP (CANCER CENTER ONLY)
ALT: 17 U/L (ref 0–44)
AST: 18 U/L (ref 15–41)
Albumin: 4.3 g/dL (ref 3.5–5.0)
Alkaline Phosphatase: 50 U/L (ref 38–126)
Anion gap: 7 (ref 5–15)
BUN: 18 mg/dL (ref 8–23)
CO2: 28 mmol/L (ref 22–32)
Calcium: 9.5 mg/dL (ref 8.9–10.3)
Chloride: 101 mmol/L (ref 98–111)
Creatinine: 0.72 mg/dL (ref 0.44–1.00)
GFR, Estimated: >60 mL/min (ref >60)
Glucose, Bld: 80 mg/dL (ref 70–99)
Potassium: 4.5 mmol/L (ref 3.5–5.1)
Sodium: 136 mmol/L (ref 135–145)
Total Bilirubin: 2.2 mg/dL — ABNORMAL HIGH (ref 0.3–1.2)
Total Protein: 6.2 g/dL — ABNORMAL LOW (ref 6.5–8.1)

## 2021-04-05 LAB — CBC WITH DIFFERENTIAL (CANCER CENTER ONLY)
Abs Immature Granulocytes: 0.04 10*3/uL (ref 0.00–0.07)
Basophils Absolute: 0.1 10*3/uL (ref 0.0–0.1)
Basophils Relative: 1 %
Eosinophils Absolute: 0.1 10*3/uL (ref 0.0–0.5)
Eosinophils Relative: 2 %
HCT: 28.8 % — ABNORMAL LOW (ref 36.0–46.0)
Hemoglobin: 9.9 g/dL — ABNORMAL LOW (ref 12.0–15.0)
Immature Granulocytes: 1 %
Lymphocytes Relative: 12 %
Lymphs Abs: 0.5 10*3/uL — ABNORMAL LOW (ref 0.7–4.0)
MCH: 32.7 pg (ref 26.0–34.0)
MCHC: 34.4 g/dL (ref 30.0–36.0)
MCV: 95 fL (ref 80.0–100.0)
Monocytes Absolute: 0.4 10*3/uL (ref 0.1–1.0)
Monocytes Relative: 9 %
Neutro Abs: 3.4 10*3/uL (ref 1.7–7.7)
Neutrophils Relative %: 75 %
Platelet Count: 197 10*3/uL (ref 150–400)
RBC: 3.03 MIL/uL — ABNORMAL LOW (ref 3.87–5.11)
RDW: 14.2 % (ref 11.5–15.5)
WBC Count: 4.5 10*3/uL (ref 4.0–10.5)
nRBC: 0 % (ref 0.0–0.2)

## 2021-04-05 LAB — RETICULOCYTES
Immature Retic Fract: 22.1 % — ABNORMAL HIGH (ref 2.3–15.9)
RBC.: 2.94 MIL/uL — ABNORMAL LOW (ref 3.87–5.11)
Retic Count, Absolute: 137.3 10*3/uL (ref 19.0–186.0)
Retic Ct Pct: 4.7 % — ABNORMAL HIGH (ref 0.4–3.1)

## 2021-04-05 NOTE — Telephone Encounter (Signed)
appts made per 04/05/21 los   Christine [name]

## 2021-04-05 NOTE — Progress Notes (Signed)
Hematology and Oncology Follow Up Visit  Christine [name] [id number] 1946/03/04 75 y.o. 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  She feels little more tired.  We saw her 6 months ago.  She has been working.  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  We did see her at a friend's wedding.  She looked pretty good.  Again she is feeling more tired.  Her hemoglobin is dropping slowly.  After this believe that the cold agglutinin is probably coming more active.  Her last cold agglutinin titer was greater than 1 :4096.  She was last treated about a year or so ago.  She received Rituxan/Bendamustine.   We now have a new monoclonal antibody therapy for cold agglutinin disease.  It is called sutimlimab [name]).  This is a complement inhibitor.  1 requirement for this is that patient need to be vaccinated against encapsulated bacteria at least 2 weeks prior to therapy.  She has had no bleeding.  Her iron studies when we last saw her showed a ferritin of 354 with an iron saturation of 81%.  She has had no change in bowel or bladder habits.  There is been no rashes.  She has had no cough or shortness of breath.  She did have COVID.  This was over a year ago.  Currently, her performance status is ECOG 1.     Medications:  Current Outpatient Medications:  .  LUTEIN PO, Take 1 capsule by mouth daily., Disp: , Rfl:  .  meclizine (ANTIVERT) 25 MG tablet, Take 25 mg by mouth as needed., Disp: , Rfl:  .  acetaminophen (TYLENOL) 500 MG tablet, Take 500-1,000 mg by mouth every 6 (six) hours as needed for mild pain or headache., Disp: , Rfl:  .  amoxicillin (AMOXIL) 500 MG capsule, Take 2,000 mg by mouth See admin instructions. Take 2,000 mg by mouth one hour before dental appointments, Disp: , Rfl:  .   BIOTIN PO, Take 1 tablet by mouth daily with breakfast., Disp: , Rfl:  .  buPROPion (WELLBUTRIN XL) 300 MG 24 hr tablet, Take 300 mg by mouth every morning. , Disp: , Rfl:  .  Cholecalciferol (VITAMIN D3 MAXIMUM STRENGTH) 125 MCG (5000 UT) capsule, Take 5,000 Units by mouth daily., Disp: , Rfl:  .  clobetasol (TEMOVATE) 0.05 % external solution, , Disp: , Rfl:  .  EPIPEN 2-PAK 0.3 MG/0.3ML SOAJ injection, Inject 0.3 mg into the muscle once as needed for anaphylaxis. , Disp: , Rfl: 6 .  finasteride (PROSCAR) 5 MG tablet, Take 2.5 mg by mouth daily., Disp: , Rfl:  .  folic acid (FOLVITE) 1 MG tablet, TAKE 2 TABLETS(2 MG) BY MOUTH DAILY, Disp: 180 tablet, Rfl: 3 .  hydrochlorothiazide (MICROZIDE) 12.5 MG capsule, Take 12.5 mg by mouth daily., Disp: , Rfl: 11 .  LUTEIN PO, Take 1 capsule by mouth daily with breakfast., Disp: , Rfl:  .  SYNTHROID 88 MCG tablet, Take 88 mcg by mouth daily before breakfast. , Disp: , Rfl:  .  zolpidem (AMBIEN) 10 MG tablet, Take 1 tablet (10 mg total) by mouth at bedtime., Disp: 30 tablet, Rfl: 3  Allergies:  Allergies  Allergen Reactions  . Bee Venom Anaphylaxis and Shortness Of Breath  . Losartan Potassium Other (See Comments)  .  Other Other (See Comments)    Past Medical History, Surgical history, Social history, and Family History were reviewed and updated.  Review of Systems: Review of Systems  Constitutional: Positive for fatigue.  HENT:  Negative.   Eyes: Negative.   Respiratory: Negative.   Cardiovascular: Negative.   Gastrointestinal: Negative.   Endocrine: Negative.   Genitourinary: Negative.    Musculoskeletal: Negative.   Skin: Negative.   Neurological: Negative.   Hematological: Negative.   Psychiatric/Behavioral: Negative.     Physical Exam:  weight is 136 lb (61.7 kg). Her oral temperature is 98.2 F (36.8 C). Her blood pressure is 129/60 and her pulse is 64. Her respiration is 18 and oxygen saturation is 100%.   Wt Readings from  Last 3 Encounters:  04/05/21 136 lb (61.7 kg)  09/09/20 135 lb (61.2 kg)  03/30/20 132 lb 12.8 oz (60.2 kg)    Physical Exam Vitals reviewed.  HENT:     Head: Normocephalic and atraumatic.  Eyes:     Pupils: Pupils are equal, round, and reactive to light.  Cardiovascular:     Rate and Rhythm: Normal rate and regular rhythm.     Heart sounds: Normal heart sounds.  Pulmonary:     Effort: Pulmonary effort is normal.     Breath sounds: Normal breath sounds.  Abdominal:     General: Bowel sounds are normal.     Palpations: Abdomen is soft.  Musculoskeletal:        General: No tenderness or deformity. Normal range of motion.     Cervical back: Normal range of motion.  Lymphadenopathy:     Cervical: No cervical adenopathy.  Skin:    General: Skin is warm and dry.     Findings: No erythema or rash.  Neurological:     Mental Status: She is alert and oriented to person, place, and time.  Psychiatric:        Behavior: Behavior normal.        Thought Content: Thought content normal.        Judgment: Judgment normal.      Lab Results  Component Value Date   WBC 4.5 04/05/2021   HGB 9.9 (L) 04/05/2021   HCT 28.8 (L) 04/05/2021   MCV 95.0 04/05/2021   PLT 197 04/05/2021     Chemistry      Component Value Date/Time   NA 136 04/05/2021 1407   NA 133 02/13/2014 1108   K 4.5 04/05/2021 1407   K 4.9 (H) 02/13/2014 1108   CL 101 04/05/2021 1407   CL 97 (L) 02/13/2014 1108   CO2 28 04/05/2021 1407   CO2 32 02/13/2014 1108   BUN 18 04/05/2021 1407   BUN 13 02/13/2014 1108   CREATININE 0.72 04/05/2021 1407   CREATININE 0.4 (L) 02/13/2014 1108      Component Value Date/Time   CALCIUM 9.5 04/05/2021 1407   CALCIUM 9.3 02/13/2014 1108   ALKPHOS 50 04/05/2021 1407   ALKPHOS 63 02/13/2014 1108   AST 18 04/05/2021 1407   ALT 17 04/05/2021 1407   ALT 15 02/13/2014 1108   BILITOT 2.2 (H) 04/05/2021 1407       Impression and Plan: Christine [name] is a 75 year old white female.   She is a history of cold agglutinin disease.  We treated her with Rituxan back in April 2015.  She tolerated this quite well.  She then had a relapse.  We went ahead and treated her with Rituxan/bendamustine.  She had 4 cycles  of treatment thus completed in February 2021.  Her reticulocyte count is holding pretty steady.  I think this is important.  Her total bilirubin is 2.2.  Again, we can certainly utilize the new agent- Sutimlimab if necessary.  I would like to see her back in 3 months.  This way we will see how she is doing in the summertime when it is warm.  Her hemoglobin is dropping, then we will probably have to initiate therapy.    Volanda Napoleon, MD 4/25/20223:18 PM

## 2021-04-06 LAB — IRON AND TIBC
Iron: 169 ug/dL — ABNORMAL HIGH (ref 41–142)
Saturation Ratios: 71 % — ABNORMAL HIGH (ref 21–57)
TIBC: 238 ug/dL (ref 236–444)
UIBC: 69 ug/dL — ABNORMAL LOW (ref 120–384)

## 2021-04-06 LAB — FERRITIN: Ferritin: 350 ng/mL — ABNORMAL HIGH (ref 11–307)

## 2021-04-08 LAB — COLD AGGLUTININ TITER: Cold Agglutinin Titer: 1:4096 {titer} — ABNORMAL HIGH

## 2021-05-05 ENCOUNTER — Encounter: Payer: Self-pay | Admitting: Hematology & Oncology

## 2021-05-05 ENCOUNTER — Ambulatory Visit
Admission: RE | Admit: 2021-05-05 | Discharge: 2021-05-05 | Disposition: A | Discharge status: Home / Self Care | Payer: No Typology Code available for payment source | Source: Ambulatory Visit | Attending: Internal Medicine | Admitting: Internal Medicine

## 2021-05-05 DIAGNOSIS — I7090 Unspecified atherosclerosis: Secondary | ICD-10-CM

## 2021-07-05 ENCOUNTER — Inpatient Hospital Stay: Payer: Medicare Other | Attending: Family

## 2021-07-05 ENCOUNTER — Encounter: Payer: Self-pay | Admitting: Hematology & Oncology

## 2021-07-05 ENCOUNTER — Inpatient Hospital Stay: Payer: Medicare Other | Admitting: Hematology & Oncology

## 2021-07-05 ENCOUNTER — Other Ambulatory Visit: Payer: Self-pay

## 2021-07-05 VITALS — BP 143/61 | HR 72 | Temp 98.1°F | Resp 18 | Wt 135.1 lb

## 2021-07-05 DIAGNOSIS — D5912 Cold autoimmune hemolytic anemia: Secondary | ICD-10-CM

## 2021-07-05 DIAGNOSIS — R5383 Other fatigue: Secondary | ICD-10-CM | POA: Insufficient documentation

## 2021-07-05 DIAGNOSIS — Z91030 Bee allergy status: Secondary | ICD-10-CM | POA: Insufficient documentation

## 2021-07-05 DIAGNOSIS — D509 Iron deficiency anemia, unspecified: Secondary | ICD-10-CM | POA: Insufficient documentation

## 2021-07-05 DIAGNOSIS — Z79899 Other long term (current) drug therapy: Secondary | ICD-10-CM | POA: Insufficient documentation

## 2021-07-05 DIAGNOSIS — M62830 Muscle spasm of back: Secondary | ICD-10-CM | POA: Insufficient documentation

## 2021-07-05 DIAGNOSIS — R58 Hemorrhage, not elsewhere classified: Secondary | ICD-10-CM | POA: Diagnosis not present

## 2021-07-05 LAB — CBC WITH DIFFERENTIAL (CANCER CENTER ONLY)
Abs Immature Granulocytes: 0.02 10*3/uL (ref 0.00–0.07)
Basophils Absolute: 0 10*3/uL (ref 0.0–0.1)
Basophils Relative: 1 %
Eosinophils Absolute: 0.1 10*3/uL (ref 0.0–0.5)
Eosinophils Relative: 2 %
HCT: 27.8 % — ABNORMAL LOW (ref 36.0–46.0)
Hemoglobin: 9.5 g/dL — ABNORMAL LOW (ref 12.0–15.0)
Immature Granulocytes: 0 %
Lymphocytes Relative: 9 %
Lymphs Abs: 0.4 10*3/uL — ABNORMAL LOW (ref 0.7–4.0)
MCH: 32.4 pg (ref 26.0–34.0)
MCHC: 34.2 g/dL (ref 30.0–36.0)
MCV: 94.9 fL (ref 80.0–100.0)
Monocytes Absolute: 0.3 10*3/uL (ref 0.1–1.0)
Monocytes Relative: 7 %
Neutro Abs: 3.7 10*3/uL (ref 1.7–7.7)
Neutrophils Relative %: 81 %
Platelet Count: 186 10*3/uL (ref 150–400)
RBC: 2.93 MIL/uL — ABNORMAL LOW (ref 3.87–5.11)
RDW: 14.7 % (ref 11.5–15.5)
WBC Count: 4.6 10*3/uL (ref 4.0–10.5)
nRBC: 0 % (ref 0.0–0.2)

## 2021-07-05 LAB — CMP (CANCER CENTER ONLY)
ALT: 30 U/L (ref 0–44)
AST: 21 U/L (ref 15–41)
Albumin: 4.5 g/dL (ref 3.5–5.0)
Alkaline Phosphatase: 62 U/L (ref 38–126)
Anion gap: 7 (ref 5–15)
BUN: 15 mg/dL (ref 8–23)
CO2: 29 mmol/L (ref 22–32)
Calcium: 9.8 mg/dL (ref 8.9–10.3)
Chloride: 98 mmol/L (ref 98–111)
Creatinine: 0.81 mg/dL (ref 0.44–1.00)
GFR, Estimated: >60 mL/min (ref >60)
Glucose, Bld: 105 mg/dL — ABNORMAL HIGH (ref 70–99)
Potassium: 3.7 mmol/L (ref 3.5–5.1)
Sodium: 134 mmol/L — ABNORMAL LOW (ref 135–145)
Total Bilirubin: 2.4 mg/dL — ABNORMAL HIGH (ref 0.3–1.2)
Total Protein: 6.4 g/dL — ABNORMAL LOW (ref 6.5–8.1)

## 2021-07-05 LAB — RETICULOCYTES
Immature Retic Fract: 25.1 % — ABNORMAL HIGH (ref 2.3–15.9)
RBC.: 2.93 MIL/uL — ABNORMAL LOW (ref 3.87–5.11)
Retic Count, Absolute: 148.8 10*3/uL (ref 19.0–186.0)
Retic Ct Pct: 5.1 % — ABNORMAL HIGH (ref 0.4–3.1)

## 2021-07-05 LAB — LACTATE DEHYDROGENASE: LDH: 230 U/L — ABNORMAL HIGH (ref 98–192)

## 2021-07-05 MED ORDER — CYCLOBENZAPRINE HCL 5 MG PO TABS: 5.0000 mg | ORAL_TABLET | Freq: Three times a day (TID) | ORAL | 0 refills | Status: DC | PRN

## 2021-07-05 NOTE — Progress Notes (Signed)
Hematology and Oncology Follow Up Visit  Christine [name] [id number] [birth year] 75 y.o. 07/05/2021   Principle Diagnosis:  Cold Agglutinin Disease -- Recurrent COVID-19 (+)  Current Therapy:   Rituxan/Bendamustine -- s/p cycle #4--completed on 01/29/2020      Interim History:  Christine [name] is back for follow-up.  Her main problem is that she has lower back spasms.  This happened over the weekend.  She was gardening.  She just turned the wrong way.  We will go ahead and send in some Flexeril for her.  This will be 5 mg p.o. every 8 hours as needed.  I just am a little worried about the activity of her cold agglutinin disease.  Her hemoglobin is 9.5.  She should be low but higher than this given the extremely hot weather that we have been having.  Her reticulocyte count is also up a little bit.  We will have to watch this closely.  Total bilirubin is 2.4.  I still do not think we have to make any adjustments or put her on anything new for the hemolytic anemia.  Again, we do have a new monoclonal antibody- sutimlimab (Enjaymo) that we can use to try to get her back in remission.  Her last cold agglutinin titer was 1: 4024.  She has had no fever.  She has had no change in bowel or bladder habits.  She has had no rashes.  She does have some occasional ecchymosis.  She thinks this might be from her Crestor.  Her last iron studies back in April showed a ferritin of 350 with an iron saturation of 71%.  Currently, I would say her performance status is ECOG 1.     Medications:  Current Outpatient Medications:    acetaminophen (TYLENOL) 500 MG tablet, Take 500-1,000 mg by mouth every 6 (six) hours as needed for mild pain or headache., Disp: , Rfl:    amoxicillin (AMOXIL) 500 MG capsule, Take 2,000 mg by mouth See admin instructions. Take 2,000 mg by mouth one hour before dental appointments, Disp: , Rfl:    BIOTIN PO, Take 1 tablet by mouth daily with breakfast., Disp: , Rfl:    buPROPion  (WELLBUTRIN XL) 300 MG 24 hr tablet, Take 300 mg by mouth every morning. , Disp: , Rfl:    Cholecalciferol (VITAMIN D3 MAXIMUM STRENGTH) 125 MCG (5000 UT) capsule, Take 5,000 Units by mouth daily., Disp: , Rfl:    clobetasol (TEMOVATE) 0.05 % external solution, , Disp: , Rfl:    EPIPEN 2-PAK 0.3 MG/0.3ML SOAJ injection, Inject 0.3 mg into the muscle once as needed for anaphylaxis. , Disp: , Rfl: 6   finasteride (PROSCAR) 5 MG tablet, Take 2.5 mg by mouth daily., Disp: , Rfl:    folic acid (FOLVITE) 1 MG tablet, TAKE 2 TABLETS(2 MG) BY MOUTH DAILY, Disp: 180 tablet, Rfl: 3   hydrochlorothiazide (MICROZIDE) 12.5 MG capsule, Take 12.5 mg by mouth daily., Disp: , Rfl: 11   LUTEIN PO, Take 1 capsule by mouth daily with breakfast., Disp: , Rfl:    LUTEIN PO, Take 1 capsule by mouth daily., Disp: , Rfl:    meclizine (ANTIVERT) 25 MG tablet, Take 25 mg by mouth as needed., Disp: , Rfl:    rosuvastatin (CRESTOR) 20 MG tablet, Take 20 mg by mouth daily., Disp: , Rfl:    SYNTHROID 88 MCG tablet, Take 88 mcg by mouth daily before breakfast. , Disp: , Rfl:    zolpidem (AMBIEN) 10 MG tablet, Take  1 tablet (10 mg total) by mouth at bedtime., Disp: 30 tablet, Rfl: 3  Allergies:  Allergies  Allergen Reactions   Bee Venom Anaphylaxis and Shortness Of Breath   Levothyroxine Other (See Comments)   Losartan Potassium Other (See Comments)   Other Other (See Comments)   Varenicline Other (See Comments)    Past Medical History, Surgical history, Social history, and Family History were reviewed and updated.  Review of Systems: Review of Systems  Constitutional:  Positive for fatigue.  HENT:  Negative.    Eyes: Negative.   Respiratory: Negative.    Cardiovascular: Negative.   Gastrointestinal: Negative.   Endocrine: Negative.   Genitourinary: Negative.    Musculoskeletal: Negative.   Skin: Negative.   Neurological: Negative.   Hematological: Negative.   Psychiatric/Behavioral: Negative.     Physical  Exam:  weight is 135 lb 1.9 oz (61.3 kg). Her oral temperature is 98.1 F (36.7 C). Her blood pressure is 143/61 (abnormal) and her pulse is 72. Her respiration is 18 and oxygen saturation is 100%.   Wt Readings from Last 3 Encounters:  07/05/21 135 lb 1.9 oz (61.3 kg)  04/05/21 136 lb (61.7 kg)  09/09/20 135 lb (61.2 kg)    Physical Exam Vitals reviewed.  HENT:     Head: Normocephalic and atraumatic.  Eyes:     Pupils: Pupils are equal, round, and reactive to light.  Cardiovascular:     Rate and Rhythm: Normal rate and regular rhythm.     Heart sounds: Normal heart sounds.  Pulmonary:     Effort: Pulmonary effort is normal.     Breath sounds: Normal breath sounds.  Abdominal:     General: Bowel sounds are normal.     Palpations: Abdomen is soft.  Musculoskeletal:        General: No tenderness or deformity. Normal range of motion.     Cervical back: Normal range of motion.  Lymphadenopathy:     Cervical: No cervical adenopathy.  Skin:    General: Skin is warm and dry.     Findings: No erythema or rash.  Neurological:     Mental Status: She is alert and oriented to person, place, and time.  Psychiatric:        Behavior: Behavior normal.        Thought Content: Thought content normal.        Judgment: Judgment normal.     Lab Results  Component Value Date   WBC 4.6 07/05/2021   HGB 9.5 (L) 07/05/2021   HCT 27.8 (L) 07/05/2021   MCV 94.9 07/05/2021   PLT 186 07/05/2021     Chemistry      Component Value Date/Time   NA 134 (L) 07/05/2021 1446   NA 133 02/13/2014 1108   K 3.7 07/05/2021 1446   K 4.9 (H) 02/13/2014 1108   CL 98 07/05/2021 1446   CL 97 (L) 02/13/2014 1108   CO2 29 07/05/2021 1446   CO2 32 02/13/2014 1108   BUN 15 07/05/2021 1446   BUN 13 02/13/2014 1108   CREATININE 0.81 07/05/2021 1446   CREATININE 0.4 (L) 02/13/2014 1108      Component Value Date/Time   CALCIUM 9.8 07/05/2021 1446   CALCIUM 9.3 02/13/2014 1108   ALKPHOS 62 07/05/2021  1446   ALKPHOS 63 02/13/2014 1108   AST 21 07/05/2021 1446   ALT 30 07/05/2021 1446   ALT 15 02/13/2014 1108   BILITOT 2.4 (H) 07/05/2021 1446  Impression and Plan: Christine [name] is a 75 year old white female.  She is a history of cold agglutinin disease.  We treated her with Rituxan back in April 2015.  She tolerated this quite well.  She then had a relapse.  We went ahead and treated her with Rituxan/bendamustine.  She had 4 cycles of treatment thus completed in February 2021.  Again, her hemoglobin is slowly dropping.  We will get have to watch this very closely.  If her hemoglobin gets below 9, I think we will then have to employ the new monoclonal antibody- Enjaymo..  I would like to see her back right after Labor Day weekend.  I think this would be a good time.  For [place] to see if she is improving.   Volanda Napoleon, MD 7/25/20224:11 PM

## 2021-07-06 ENCOUNTER — Telehealth: Payer: Self-pay

## 2021-07-06 LAB — IRON AND TIBC
Iron: 53 ug/dL (ref 41–142)
Saturation Ratios: 22 % (ref 21–57)
TIBC: 245 ug/dL (ref 236–444)
UIBC: 192 ug/dL (ref 120–384)

## 2021-07-06 LAB — FERRITIN: Ferritin: 444 ng/mL — ABNORMAL HIGH (ref 11–307)

## 2021-07-06 LAB — COLD AGGLUTININ TITER: Cold Agglutinin Titer: 1:256 {titer} — ABNORMAL HIGH

## 2021-07-27 ENCOUNTER — Telehealth: Payer: Self-pay

## 2021-08-05 ENCOUNTER — Other Ambulatory Visit: Payer: Self-pay

## 2021-08-05 ENCOUNTER — Encounter: Payer: Self-pay | Admitting: Cardiovascular Disease

## 2021-08-05 ENCOUNTER — Ambulatory Visit: Payer: Medicare Other | Admitting: Cardiovascular Disease

## 2021-08-05 VITALS — BP 136/82 | HR 80 | Ht 65.0 in | Wt 135.4 lb

## 2021-08-05 DIAGNOSIS — R0602 Shortness of breath: Secondary | ICD-10-CM | POA: Diagnosis not present

## 2021-08-05 DIAGNOSIS — I2510 Atherosclerotic heart disease of native coronary artery without angina pectoris: Secondary | ICD-10-CM

## 2021-08-05 MED ORDER — ASPIRIN EC 81 MG PO TBEC: 81.0000 mg | DELAYED_RELEASE_TABLET | Freq: Every day | ORAL | 3 refills | Status: DC

## 2021-08-05 NOTE — Progress Notes (Signed)
Chief Complaint  Patient presents with   New Patient (Initial Visit)    CAD, abnormal coronary calcium score   History of Present Illness: 75 yo female with history of cold agglutinin hemolytic anemia, arthritis, prior DVT, anxiety/depression, vertigo, HTN, thoracic aortic aneurysm, hypothyroidism and recent finding of coronary calcium on non-contrast chest CT who is here today as a new consult, referred by Dr. Brigitte Pulse, to establish cardiac care after recent abnormal coronary calcium score. She is known to have mild dilation of her ascending aorta (4.1 cm in 2020 and 4.3 cm by un-enhanced CT in 2022). Recent coronary calcium score of 2770 in May 2022 (99th percentile).   She tells me today that she has no chest pain. She does have dyspnea when walking up stairs or hills. No change over past few years. Baseline dizziness with vertigo. She smoked up until 2002, 1/2 ppd.   Primary Care Physician:Shaw, Emily Filbert., MD  Past Medical History:  Diagnosis Date   Anxiety    Arthritis    hands   Cold agglutinin disease ([place]) 12/23/2013   Deep venous thrombosis of calf ([place]) 2002   Depression    Diverticulosis    Goals of care, counseling/discussion 10/11/2019   History of blood transfusion 2009   Hypertension    Osteopenia    PONV (postoperative nausea and vomiting)    nausea only yrs ago   Tubular adenoma of colon 2011    Past Surgical History:  Procedure Laterality Date   ABDOMINAL HYSTERECTOMY  age 89   cataract surgery Bilateral 2011   CHOLECYSTECTOMY N/A 09/20/2013   Procedure: LAPAROSCOPIC CHOLECYSTECTOMY WITH INTRAOPERATIVE CHOLANGIOGRAM;  Surgeon: Odis Hollingshead, MD;  Location: [facility];  Service: General;  Laterality: N/A;   KNEE ARTHROSCOPY     left knee   REPLACEMENT TOTAL KNEE Left 2006    Current Outpatient Medications  Medication Sig Dispense Refill   acetaminophen (TYLENOL) 500 MG tablet Take 500-1,000 mg by mouth every 6 (six) hours as needed for mild pain or  headache.     amoxicillin (AMOXIL) 500 MG capsule Take 2,000 mg by mouth See admin instructions. Take 2,000 mg by mouth one hour before dental appointments     aspirin EC 81 MG tablet Take 1 tablet (81 mg total) by mouth daily. Swallow whole. 90 tablet 3   Cholecalciferol (VITAMIN D3 MAXIMUM STRENGTH) 125 MCG (5000 UT) capsule Take 5,000 Units by mouth daily.     clobetasol (TEMOVATE) 0.05 % external solution      cyclobenzaprine (FLEXERIL) 5 MG tablet Take 1 tablet (5 mg total) by mouth 3 (three) times daily as needed for muscle spasms. 40 tablet 0   EPIPEN 2-PAK 0.3 MG/0.3ML SOAJ injection Inject 0.3 mg into the muscle once as needed for anaphylaxis.   6   finasteride (PROSCAR) 5 MG tablet Take 2.5 mg by mouth daily.     folic acid (FOLVITE) 1 MG tablet TAKE 2 TABLETS(2 MG) BY MOUTH DAILY 180 tablet 3   hydrochlorothiazide (MICROZIDE) 12.5 MG capsule Take 12.5 mg by mouth daily.  11   meclizine (ANTIVERT) 25 MG tablet Take 25 mg by mouth as needed.     rosuvastatin (CRESTOR) 20 MG tablet Take 20 mg by mouth daily.     SYNTHROID 88 MCG tablet Take 88 mcg by mouth daily before breakfast.      zolpidem (AMBIEN) 10 MG tablet Take 1 tablet (10 mg total) by mouth at bedtime. 30 tablet 3   BIOTIN PO  Take 1 tablet by mouth daily with breakfast. (Patient not taking: Reported on 08/05/2021)     buPROPion (WELLBUTRIN XL) 300 MG 24 hr tablet Take 300 mg by mouth every morning.  (Patient not taking: Reported on 08/05/2021)     LUTEIN PO Take 1 capsule by mouth daily with breakfast.     LUTEIN PO Take 1 capsule by mouth daily.     No current facility-administered medications for this visit.    Allergies  Allergen Reactions   Bee Venom Anaphylaxis and Shortness Of Breath   Losartan Potassium Other (See Comments)   Other Other (See Comments)   Varenicline Other (See Comments)    Social History   Socioeconomic History   Marital status: Married    Spouse name: Not on file   Number of children: 3    Years of education: Not on file   Highest education level: Not on file  Occupational History   Occupation: Self employeed   Tobacco Use   Smoking status: Former    Packs/day: 0.50    Years: 20.00    Pack years: 10.00    Types: Cigarettes    Start date: 01/28/1964    Quit date: 09/02/2009    Years since quitting: 11.9   Smokeless tobacco: Never   Tobacco comments:    quit 5 years ago  Vaping Use   Vaping Use: Never used  Substance and Sexual Activity   Alcohol use: Yes    Alcohol/week: 5.0 standard drinks    Types: 5 Glasses of wine per week    Comment: 2 glasses wine per day   Drug use: No   Sexual activity: Not on file  Other Topics Concern   Not on file  Social History Narrative   Not on file   Social Determinants of Health   Financial Resource Strain: Not on file  Food Insecurity: Not on file  Transportation Needs: Not on file  Physical Activity: Not on file  Stress: Not on file  Social Connections: Not on file  Intimate Partner Violence: Not on file    Family History  Problem Relation Age of Onset   Hypertension Mother    Alcohol abuse Father    Heart attack Father        died age 70   Heart disease Brother    Liver cancer Maternal Grandfather    Heart attack Maternal Grandmother     Review of Systems:  As stated in the HPI and otherwise negative.   BP 136/82   Pulse 80   Ht '5\' 5"'$  (1.651 m)   Wt 135 lb 6.4 oz (61.4 kg)   SpO2 98%   BMI 22.53 kg/m   Physical Examination: General: Well developed, well nourished, NAD  HEENT: OP clear, mucus membranes moist  SKIN: warm, dry. No rashes. Neuro: No focal deficits  Musculoskeletal: Muscle strength 5/5 all ext  Psychiatric: Mood and affect normal  Neck: No JVD, no carotid bruits, no thyromegaly, no lymphadenopathy.  Lungs:Clear bilaterally, no wheezes, rhonci, crackles Cardiovascular: Regular rate and rhythm. No murmurs, gallops or rubs. Abdomen:Soft. Bowel sounds present. Non-tender.  Extremities: No  lower extremity edema. Pulses are 2 + in the bilateral DP/PT.  EKG:  EKG is ordered today. The ekg ordered today demonstrates sinus, poor R wave progression across the precordial leads  Recent Labs: 07/05/2021: ALT 30; BUN 15; Creatinine 0.81; Hemoglobin 9.5; Platelet Count 186; Potassium 3.7; Sodium 134   Lipid Panel No results found for: CHOL, TRIG, HDL, CHOLHDL,  VLDL, LDLCALC, LDLDIRECT   Wt Readings from Last 3 Encounters:  08/05/21 135 lb 6.4 oz (61.4 kg)  07/05/21 135 lb 1.9 oz (61.3 kg)  04/05/21 136 lb (61.7 kg)      Assessment and Plan:   1. CAD without angina: She has no chest pain. Abnormal coronary calcium score (see above). Will arrange exercise nuclear stress test and echo. She will start ASA 81 mg daily. Continue statin.   2. HTN: BP controlled. No changes  Current medicines are reviewed at length with the patient today.  The patient does not have concerns regarding medicines.  The following changes have been made:  no change  Labs/ tests ordered today include:   Orders Placed This Encounter  Procedures   MYOCARDIAL PERFUSION IMAGING   EKG 12-Lead   ECHOCARDIOGRAM COMPLETE      Disposition:   F/U with me in one year.     Signed, Lauree Chandler, MD 08/05/2021 4:00 PM    [facility] [place], [place], [place]  [address] Phone: [phone]; Fax: [phone]

## 2021-08-05 NOTE — Patient Instructions (Addendum)
Medication Instructions:  Your physician has recommended you make the following change in your medication:    START Aspirin 81 mg daily  *If you need a refill on your cardiac medications before your next appointment, please call your pharmacy*   Lab Work: None ordered  If you have labs (blood work) drawn today and your tests are completely normal, you will receive your results only by: [place] (if you have MyChart) OR A paper copy in the mail If you have any lab test that is abnormal or we need to change your treatment, we will call you to review the results.   Testing/Procedures: Your physician has requested that you have an echocardiogram. Echocardiography is a painless test that uses sound waves to create images of your heart. It provides your doctor with information about the size and shape of your heart and how well your heart's chambers and valves are working. This procedure takes approximately one hour. There are no restrictions for this procedure.   Your physician has requested that you have en exercise stress myoview. For further information please visit [email or web address]. Please follow instruction sheet, BELOW   You are scheduled for a Myocardial Perfusion Imaging  Please arrive 15 minutes prior to your appointment time for registration and insurance purposes.  The test will take approximately 3 to 4 hours to complete; you may bring reading material.  If someone comes with you to your appointment, they will need to remain in the main lobby due to limited space in the testing area. **If you are pregnant or breastfeeding, please notify the nuclear lab prior to your appointment**  How to prepare for your Myocardial Perfusion Test: Do not eat or drink 3 hours prior to your test, except you may have water. Do not consume products containing caffeine (regular or decaffeinated) 12 hours prior to your test. (ex: coffee, chocolate, sodas, tea). Do bring a list of your  current medications with you.  If not listed below, you may take your medications as normal. Do wear comfortable clothes (no dresses or overalls) and walking shoes, tennis shoes preferred (No heels or open toe shoes are allowed). Do NOT wear cologne, perfume, aftershave, or lotions (deodorant is allowed). If these instructions are not followed, your test will have to be rescheduled.      Follow-Up: At [facility], you and your health needs are our priority.  As part of our continuing mission to provide you with exceptional heart care, we have created designated Provider Care Teams.  These Care Teams include your primary Cardiologist (physician) and Advanced Practice Providers (APPs -  Physician Assistants and Nurse Practitioners) who all work together to provide you with the care you need, when you need it.  We recommend signing up for the patient portal called "MyChart".  Sign up information is provided on this After Visit Summary.  MyChart is used to connect with patients for Virtual Visits (Telemedicine).  Patients are able to view lab/test results, encounter notes, upcoming appointments, etc.  Non-urgent messages can be sent to your provider as well.   To learn more about what you can do with MyChart, go to [email or web address].    Your next appointment:   12 month(s)  The format for your next appointment:   In Person  Provider:   Lauree Chandler, MD   Other Instructions

## 2021-08-13 NOTE — Addendum Note (Signed)
Addended by: Lauree Chandler D on: 08/13/2021 01:39 PM   Modules accepted: Orders

## 2021-08-13 NOTE — Addendum Note (Signed)
Addended by: Rodman Key on: 08/13/2021 12:32 PM   Modules accepted: Orders

## 2021-08-23 ENCOUNTER — Ambulatory Visit: Payer: Medicare Other | Admitting: Hematology & Oncology

## 2021-08-23 ENCOUNTER — Other Ambulatory Visit: Payer: Medicare Other

## 2021-08-25 ENCOUNTER — Telehealth (HOSPITAL_COMMUNITY): Payer: Self-pay

## 2021-08-25 NOTE — Telephone Encounter (Signed)
Spoke with the patient, detailed instructions given. She stated that she would be here for her test. Asked to call back with any questions. S.Sharlon Pfohl EMTP 

## 2021-08-31 ENCOUNTER — Ambulatory Visit (HOSPITAL_COMMUNITY): Payer: Medicare Other | Attending: Cardiovascular Disease

## 2021-08-31 ENCOUNTER — Other Ambulatory Visit: Payer: Self-pay

## 2021-08-31 ENCOUNTER — Ambulatory Visit (HOSPITAL_BASED_OUTPATIENT_CLINIC_OR_DEPARTMENT_OTHER): Payer: Medicare Other

## 2021-08-31 DIAGNOSIS — R0602 Shortness of breath: Secondary | ICD-10-CM | POA: Insufficient documentation

## 2021-08-31 DIAGNOSIS — I2510 Atherosclerotic heart disease of native coronary artery without angina pectoris: Secondary | ICD-10-CM

## 2021-08-31 LAB — ECHOCARDIOGRAM COMPLETE
Area-P 1/2: 3.53 cm2
Height: 65 in
P 1/2 time: 401 msec
S' Lateral: 2.7 cm
Weight: 2160 oz

## 2021-08-31 LAB — MYOCARDIAL PERFUSION IMAGING
Angina Index: 0
Base ST Depression (mm): 0 mm
LV dias vol: 69 mL (ref 46–106)
LV sys vol: 28 mL
Nuc Stress EF: 60 %
Rest Nuclear Isotope Dose: 11 mCi
SDS: 1
SRS: 0
SSS: 1
ST Depression (mm): 0 mm
Stress Nuclear Isotope Dose: 30.6 mCi
TID: 0.89

## 2021-08-31 MED ORDER — TECHNETIUM TC 99M TETROFOSMIN IV KIT
11.0000 | PACK | Freq: Once | INTRAVENOUS | Status: AC | PRN
  Administered 2021-08-31: 11 via INTRAVENOUS
  Filled 2021-08-31: qty 11

## 2021-08-31 MED ORDER — REGADENOSON 0.4 MG/5ML IV SOLN
0.4000 mg | Freq: Once | INTRAVENOUS | Status: AC
Start: 2021-08-31 — End: ?

## 2021-08-31 MED ORDER — TECHNETIUM TC 99M TETROFOSMIN IV KIT
30.6000 | PACK | Freq: Once | INTRAVENOUS | Status: AC | PRN
  Administered 2021-08-31: 30.6 via INTRAVENOUS
  Filled 2021-08-31: qty 31

## 2021-09-07 ENCOUNTER — Other Ambulatory Visit: Payer: Medicare Other

## 2021-09-07 ENCOUNTER — Ambulatory Visit: Payer: Medicare Other | Admitting: Hematology & Oncology

## 2021-09-13 ENCOUNTER — Telehealth: Payer: Self-pay | Admitting: *Deleted

## 2021-09-13 ENCOUNTER — Inpatient Hospital Stay: Payer: Medicare Other | Admitting: Hematology & Oncology

## 2021-09-13 ENCOUNTER — Other Ambulatory Visit: Payer: Self-pay

## 2021-09-13 ENCOUNTER — Inpatient Hospital Stay: Payer: Medicare Other | Attending: Family

## 2021-09-13 ENCOUNTER — Encounter: Payer: Self-pay | Admitting: Hematology & Oncology

## 2021-09-13 VITALS — BP 156/65 | HR 65 | Temp 97.7°F | Resp 18 | Wt 134.0 lb

## 2021-09-13 DIAGNOSIS — Z79899 Other long term (current) drug therapy: Secondary | ICD-10-CM | POA: Diagnosis not present

## 2021-09-13 DIAGNOSIS — Z8616 Personal history of COVID-19: Secondary | ICD-10-CM | POA: Insufficient documentation

## 2021-09-13 DIAGNOSIS — Z862 Personal history of diseases of the blood and blood-forming organs and certain disorders involving the immune mechanism: Secondary | ICD-10-CM | POA: Diagnosis not present

## 2021-09-13 DIAGNOSIS — D5912 Cold autoimmune hemolytic anemia: Secondary | ICD-10-CM | POA: Diagnosis not present

## 2021-09-13 DIAGNOSIS — Z7982 Long term (current) use of aspirin: Secondary | ICD-10-CM | POA: Diagnosis not present

## 2021-09-13 LAB — RETICULOCYTES
Immature Retic Fract: 19.2 % — ABNORMAL HIGH (ref 2.3–15.9)
RBC.: 3.22 MIL/uL — ABNORMAL LOW (ref 3.87–5.11)
Retic Count, Absolute: 162.3 10*3/uL (ref 19.0–186.0)
Retic Ct Pct: 5 % — ABNORMAL HIGH (ref 0.4–3.1)

## 2021-09-13 LAB — CMP (CANCER CENTER ONLY)
ALT: 22 U/L (ref 0–44)
AST: 20 U/L (ref 15–41)
Albumin: 4.4 g/dL (ref 3.5–5.0)
Alkaline Phosphatase: 59 U/L (ref 38–126)
Anion gap: 7 (ref 5–15)
BUN: 11 mg/dL (ref 8–23)
CO2: 29 mmol/L (ref 22–32)
Calcium: 9.3 mg/dL (ref 8.9–10.3)
Chloride: 100 mmol/L (ref 98–111)
Creatinine: 0.81 mg/dL (ref 0.44–1.00)
GFR, Estimated: >60 mL/min (ref >60)
Glucose, Bld: 100 mg/dL — ABNORMAL HIGH (ref 70–99)
Potassium: 4 mmol/L (ref 3.5–5.1)
Sodium: 136 mmol/L (ref 135–145)
Total Bilirubin: 2.2 mg/dL — ABNORMAL HIGH (ref 0.3–1.2)
Total Protein: 6.4 g/dL — ABNORMAL LOW (ref 6.5–8.1)

## 2021-09-13 LAB — CBC WITH DIFFERENTIAL (CANCER CENTER ONLY)
Abs Immature Granulocytes: 0.02 10*3/uL (ref 0.00–0.07)
Basophils Absolute: 0.1 10*3/uL (ref 0.0–0.1)
Basophils Relative: 1 %
Eosinophils Absolute: 0.1 10*3/uL (ref 0.0–0.5)
Eosinophils Relative: 2 %
HCT: 31.1 % — ABNORMAL LOW (ref 36.0–46.0)
Hemoglobin: 10.9 g/dL — ABNORMAL LOW (ref 12.0–15.0)
Immature Granulocytes: 0 %
Lymphocytes Relative: 13 %
Lymphs Abs: 0.6 10*3/uL — ABNORMAL LOW (ref 0.7–4.0)
MCH: 33.5 pg (ref 26.0–34.0)
MCHC: 35 g/dL (ref 30.0–36.0)
MCV: 95.7 fL (ref 80.0–100.0)
Monocytes Absolute: 0.3 10*3/uL (ref 0.1–1.0)
Monocytes Relative: 7 %
Neutro Abs: 3.6 10*3/uL (ref 1.7–7.7)
Neutrophils Relative %: 77 %
Platelet Count: 237 10*3/uL (ref 150–400)
RBC: 3.25 MIL/uL — ABNORMAL LOW (ref 3.87–5.11)
RDW: 14.4 % (ref 11.5–15.5)
WBC Count: 4.7 10*3/uL (ref 4.0–10.5)
nRBC: 0 % (ref 0.0–0.2)

## 2021-09-13 LAB — LACTATE DEHYDROGENASE: LDH: 216 U/L — ABNORMAL HIGH (ref 98–192)
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
## 2021-09-13 NOTE — Telephone Encounter (Signed)
Per 09/13/21 los - gave upcoming appointments - confirmed - print calendar 

## 2021-09-14 LAB — COLD AGGLUTININ TITER: Cold Agglutinin Titer: 1:128 {titer} — ABNORMAL HIGH

## 2021-09-14 LAB — IRON AND TIBC
Iron: 202 ug/dL — ABNORMAL HIGH (ref 41–142)
Saturation Ratios: 81 % — ABNORMAL HIGH (ref 21–57)
TIBC: 249 ug/dL (ref 236–444)
UIBC: 47 ug/dL — ABNORMAL LOW (ref 120–384)

## 2021-09-14 LAB — FERRITIN: Ferritin: 416 ng/mL — ABNORMAL HIGH (ref 11–307)

## 2021-11-04 ENCOUNTER — Other Ambulatory Visit: Payer: Self-pay | Admitting: Hematology & Oncology

## 2021-11-04 DIAGNOSIS — D500 Iron deficiency anemia secondary to blood loss (chronic): Secondary | ICD-10-CM

## 2021-12-20 ENCOUNTER — Inpatient Hospital Stay: Payer: Medicare Other | Attending: Family

## 2021-12-20 ENCOUNTER — Inpatient Hospital Stay: Payer: Medicare Other | Admitting: Hematology & Oncology

## 2021-12-20 ENCOUNTER — Encounter: Payer: Self-pay | Admitting: Hematology & Oncology

## 2021-12-20 ENCOUNTER — Other Ambulatory Visit: Payer: Self-pay

## 2021-12-20 VITALS — BP 146/61 | HR 64 | Temp 98.2°F | Resp 18 | Wt 135.0 lb

## 2021-12-20 DIAGNOSIS — Z8616 Personal history of COVID-19: Secondary | ICD-10-CM | POA: Insufficient documentation

## 2021-12-20 DIAGNOSIS — D5912 Cold autoimmune hemolytic anemia: Secondary | ICD-10-CM | POA: Insufficient documentation

## 2021-12-20 DIAGNOSIS — Z9221 Personal history of antineoplastic chemotherapy: Secondary | ICD-10-CM | POA: Diagnosis not present

## 2021-12-20 DIAGNOSIS — R5383 Other fatigue: Secondary | ICD-10-CM | POA: Insufficient documentation

## 2021-12-20 LAB — CBC WITH DIFFERENTIAL (CANCER CENTER ONLY)
Abs Immature Granulocytes: 0.1 10*3/uL — ABNORMAL HIGH (ref 0.00–0.07)
Band Neutrophils: 0 %
Basophils Absolute: 0.1 10*3/uL (ref 0.0–0.1)
Basophils Relative: 1 %
Eosinophils Absolute: 0.1 10*3/uL (ref 0.0–0.5)
Eosinophils Relative: 1 %
HCT: 30.3 % — ABNORMAL LOW (ref 36.0–46.0)
Hemoglobin: 10.6 g/dL — ABNORMAL LOW (ref 12.0–15.0)
Immature Granulocytes: 1 %
Lymphocytes Relative: 10 %
Lymphs Abs: 0.5 10*3/uL — ABNORMAL LOW (ref 0.7–4.0)
MCH: 33.3 pg (ref 26.0–34.0)
MCHC: 35 g/dL (ref 30.0–36.0)
MCV: 95.3 fL (ref 80.0–100.0)
Monocytes Absolute: 0.3 10*3/uL (ref 0.1–1.0)
Monocytes Relative: 5 %
Neutro Abs: 4.1 10*3/uL (ref 1.7–7.7)
Neutrophils Relative %: 81 %
Platelet Count: 198 10*3/uL (ref 150–400)
RBC: 3.18 MIL/uL — ABNORMAL LOW (ref 3.87–5.11)
RDW: 14.4 % (ref 11.5–15.5)
WBC Count: 5.1 10*3/uL (ref 4.0–10.5)
nRBC: 0 % (ref 0.0–0.2)

## 2021-12-20 LAB — CMP (CANCER CENTER ONLY)
ALT: 24 U/L (ref 0–44)
AST: 24 U/L (ref 15–41)
Albumin: 4.7 g/dL (ref 3.5–5.0)
Alkaline Phosphatase: 64 U/L (ref 38–126)
Anion gap: 8 (ref 5–15)
BUN: 15 mg/dL (ref 8–23)
CO2: 27 mmol/L (ref 22–32)
Calcium: 9.7 mg/dL (ref 8.9–10.3)
Chloride: 101 mmol/L (ref 98–111)
Creatinine: 0.83 mg/dL (ref 0.44–1.00)
GFR, Estimated: >60 mL/min (ref >60)
Glucose, Bld: 78 mg/dL (ref 70–99)
Potassium: 3.8 mmol/L (ref 3.5–5.1)
Sodium: 136 mmol/L (ref 135–145)
Total Bilirubin: 2.2 mg/dL — ABNORMAL HIGH (ref 0.3–1.2)
Total Protein: 6.5 g/dL (ref 6.5–8.1)

## 2021-12-20 LAB — RETICULOCYTES
Immature Retic Fract: 22.5 % — ABNORMAL HIGH (ref 2.3–15.9)
RBC.: 3.17 MIL/uL — ABNORMAL LOW (ref 3.87–5.11)
Retic Count, Absolute: 145.5 10*3/uL (ref 19.0–186.0)
Retic Ct Pct: 4.6 % — ABNORMAL HIGH (ref 0.4–3.1)

## 2021-12-20 NOTE — Progress Notes (Signed)
Hematology and Oncology Follow Up Visit  LAURABETH YIP [id number] May 05, 1946 76 y.o. 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 daily      Interim History:  Ms. Zelada is back for follow-up.  She is doing quite nicely.  We got her through the holiday season.  She had a wonderful holiday season.  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  Only last saw her back in October, her cold agglutinin titer was only 1: 128.  She is on folic acid.  She is doing well on the folic acid.  She has had no problems with cough or shortness of breath.  She has had no issues with nausea or vomiting.  There is no change in bowel or bladder habits.  She has had no problems with rashes.  There is been no leg swelling.  Overall, I would say performance status is ECOG 1.     Medications:  Current Outpatient Medications:    acetaminophen (TYLENOL) 500 MG tablet, Take 500-1,000 mg by mouth every 6 (six) hours as needed for mild pain or headache., Disp: , Rfl:    aspirin EC 81 MG tablet, Take 1 tablet (81 mg total) by mouth daily. Swallow whole., Disp: 90 tablet, Rfl: 3   BIOTIN PO, Take 1 tablet by mouth daily with breakfast. (Patient not taking: Reported on 08/05/2021), Disp: , Rfl:    buPROPion (WELLBUTRIN XL) 300 MG 24 hr tablet, Take 300 mg by mouth every morning.  (Patient not taking: Reported on 08/05/2021), Disp: , Rfl:    Cholecalciferol (VITAMIN D3 MAXIMUM STRENGTH) 125 MCG (5000 UT) capsule, Take 5,000 Units by mouth daily., Disp: , Rfl:    EPIPEN 2-PAK 0.3 MG/0.3ML SOAJ injection, Inject 0.3 mg into the muscle once as needed for anaphylaxis. , Disp: , Rfl: 6   finasteride (PROSCAR) 5 MG tablet, Take 2.5 mg by mouth daily., Disp: , Rfl:    folic acid (FOLVITE) 1 MG tablet, TAKE 2 TABLETS(2 MG) BY MOUTH DAILY, Disp: 180 tablet, Rfl: 3    hydrochlorothiazide (MICROZIDE) 12.5 MG capsule, Take 12.5 mg by mouth daily., Disp: , Rfl: 11   meclizine (ANTIVERT) 25 MG tablet, Take 25 mg by mouth as needed., Disp: , Rfl:    rosuvastatin (CRESTOR) 20 MG tablet, Take 20 mg by mouth daily., Disp: , Rfl:    SYNTHROID 88 MCG tablet, Take 88 mcg by mouth daily before breakfast. , Disp: , Rfl:    zolpidem (AMBIEN) 10 MG tablet, Take 1 tablet (10 mg total) by mouth at bedtime., Disp: 30 tablet, Rfl: 3 No current facility-administered medications for this visit.  Facility-Administered Medications Ordered in Other Visits:    regadenoson (LEXISCAN) injection SOLN 0.4 mg, 0.4 mg, Intravenous, Once, Skeet Latch, MD  Allergies:  Allergies  Allergen Reactions   Bee Venom Anaphylaxis and Shortness Of Breath   Losartan Potassium Other (See Comments)   Other Other (See Comments)   Varenicline Other (See Comments)    Past Medical History, Surgical history, Social history, and Family History were reviewed and updated.  Review of Systems: Review of Systems  Constitutional:  Positive for fatigue.  HENT:  Negative.    Eyes: Negative.   Respiratory: Negative.    Cardiovascular: Negative.   Gastrointestinal: Negative.   Endocrine: Negative.   Genitourinary: Negative.    Musculoskeletal: Negative.   Skin:  Negative.   Neurological: Negative.   Hematological: Negative.   Psychiatric/Behavioral: Negative.     Physical Exam:  weight is 135 lb (61.2 kg). Her oral temperature is 98.2 F (36.8 C). Her blood pressure is 146/61 (abnormal) and her pulse is 64. Her respiration is 18 and oxygen saturation is 100%.   Wt Readings from Last 3 Encounters:  12/20/21 135 lb (61.2 kg)  09/13/21 134 lb (60.8 kg)  08/31/21 135 lb (61.2 kg)    Physical Exam Vitals reviewed.  HENT:     Head: Normocephalic and atraumatic.  Eyes:     Pupils: Pupils are equal, round, and reactive to light.  Cardiovascular:     Rate and Rhythm: Normal rate and regular  rhythm.     Heart sounds: Normal heart sounds.  Pulmonary:     Effort: Pulmonary effort is normal.     Breath sounds: Normal breath sounds.  Abdominal:     General: Bowel sounds are normal.     Palpations: Abdomen is soft.  Musculoskeletal:        General: No tenderness or deformity. Normal range of motion.     Cervical back: Normal range of motion.  Lymphadenopathy:     Cervical: No cervical adenopathy.  Skin:    General: Skin is warm and dry.     Findings: No erythema or rash.  Neurological:     Mental Status: She is alert and oriented to person, place, and time.  Psychiatric:        Behavior: Behavior normal.        Thought Content: Thought content normal.        Judgment: Judgment normal.     Lab Results  Component Value Date   WBC 5.1 12/20/2021   HGB 10.6 (L) 12/20/2021   HCT 30.3 (L) 12/20/2021   MCV 95.3 12/20/2021   PLT 198 12/20/2021     Chemistry      Component Value Date/Time   NA 136 12/20/2021 1346   NA 133 02/13/2014 1108   K 3.8 12/20/2021 1346   K 4.9 (H) 02/13/2014 1108   CL 101 12/20/2021 1346   CL 97 (L) 02/13/2014 1108   CO2 27 12/20/2021 1346   CO2 32 02/13/2014 1108   BUN 15 12/20/2021 1346   BUN 13 02/13/2014 1108   CREATININE 0.83 12/20/2021 1346   CREATININE 0.4 (L) 02/13/2014 1108      Component Value Date/Time   CALCIUM 9.7 12/20/2021 1346   CALCIUM 9.3 02/13/2014 1108   ALKPHOS 64 12/20/2021 1346   ALKPHOS 63 02/13/2014 1108   AST 24 12/20/2021 1346   ALT 24 12/20/2021 1346   ALT 15 02/13/2014 1108   BILITOT 2.2 (H) 12/20/2021 1346       Impression and Plan: Ms. Sorci is a 76 year old white female.  She is a history of cold agglutinin disease.  We treated her with Rituxan back in April 2015.  She tolerated this quite well.  She then had a relapse.  We went ahead and treated her with Rituxan/bendamustine.  She had 4 cycles of treatment thus completed in February 2021.  So far, hemoglobin is holding steady despite the  cold weather that we have had.  I think the folic acid is helping.  We will have to see what the cold agglutinin titer is.  We will now plan to get her back in the Spring.  Again, if we have to retreat her, I probably would use the monoclonal antibody- Enjaymo.  Volanda Napoleon, MD 1/9/20232:47 PM

## 2021-12-21 LAB — IRON AND IRON BINDING CAPACITY (CC-WL,HP ONLY)
Iron: 188 ug/dL — ABNORMAL HIGH (ref 28–170)
Saturation Ratios: 68 % — ABNORMAL HIGH (ref 10.4–31.8)
TIBC: 279 ug/dL (ref 250–450)
UIBC: 91 ug/dL — ABNORMAL LOW (ref 148–442)

## 2021-12-21 LAB — COLD AGGLUTININ TITER: Cold Agglutinin Titer: 1:4096 {titer} — ABNORMAL HIGH

## 2021-12-21 LAB — FERRITIN: Ferritin: 403 ng/mL — ABNORMAL HIGH (ref 11–307)

## 2022-03-21 ENCOUNTER — Ambulatory Visit: Payer: Medicare Other | Admitting: Hematology & Oncology

## 2022-03-21 ENCOUNTER — Inpatient Hospital Stay: Payer: Medicare Other | Admitting: Hematology & Oncology

## 2022-03-21 ENCOUNTER — Inpatient Hospital Stay: Payer: Medicare Other | Attending: Family

## 2022-03-21 ENCOUNTER — Inpatient Hospital Stay: Payer: Medicare Other

## 2022-03-21 ENCOUNTER — Other Ambulatory Visit: Payer: Self-pay

## 2022-03-21 ENCOUNTER — Encounter: Payer: Self-pay | Admitting: Hematology & Oncology

## 2022-03-21 VITALS — BP 109/74 | HR 60 | Temp 98.0°F | Resp 16 | Wt 135.0 lb

## 2022-03-21 DIAGNOSIS — D5912 Cold autoimmune hemolytic anemia: Secondary | ICD-10-CM | POA: Diagnosis not present

## 2022-03-21 LAB — CMP (CANCER CENTER ONLY)
ALT: 24 U/L (ref 0–44)
AST: 23 U/L (ref 15–41)
Albumin: 4.7 g/dL (ref 3.5–5.0)
Alkaline Phosphatase: 56 U/L (ref 38–126)
Anion gap: 8 (ref 5–15)
BUN: 16 mg/dL (ref 8–23)
CO2: 26 mmol/L (ref 22–32)
Calcium: 9.7 mg/dL (ref 8.9–10.3)
Chloride: 98 mmol/L (ref 98–111)
Creatinine: 0.78 mg/dL (ref 0.44–1.00)
GFR, Estimated: >60 mL/min (ref >60)
Glucose, Bld: 92 mg/dL (ref 70–99)
Potassium: 3.9 mmol/L (ref 3.5–5.1)
Sodium: 132 mmol/L — ABNORMAL LOW (ref 135–145)
Total Bilirubin: 2.4 mg/dL — ABNORMAL HIGH (ref 0.3–1.2)
Total Protein: 7 g/dL (ref 6.5–8.1)

## 2022-03-21 LAB — CBC WITH DIFFERENTIAL (CANCER CENTER ONLY)
Abs Immature Granulocytes: 0.02 10*3/uL (ref 0.00–0.07)
Basophils Absolute: 0.1 10*3/uL (ref 0.0–0.1)
Basophils Relative: 1 %
Eosinophils Absolute: 0.1 10*3/uL (ref 0.0–0.5)
Eosinophils Relative: 2 %
HCT: 35.3 % — ABNORMAL LOW (ref 36.0–46.0)
Hemoglobin: 12.2 g/dL (ref 12.0–15.0)
Immature Granulocytes: 1 %
Lymphocytes Relative: 15 %
Lymphs Abs: 0.5 10*3/uL — ABNORMAL LOW (ref 0.7–4.0)
MCH: 32.8 pg (ref 26.0–34.0)
MCHC: 34.6 g/dL (ref 30.0–36.0)
MCV: 94.9 fL (ref 80.0–100.0)
Monocytes Absolute: 0.2 10*3/uL (ref 0.1–1.0)
Monocytes Relative: 7 %
Neutro Abs: 2.6 10*3/uL (ref 1.7–7.7)
Neutrophils Relative %: 74 %
Platelet Count: 218 10*3/uL (ref 150–400)
RBC: 3.72 MIL/uL — ABNORMAL LOW (ref 3.87–5.11)
RDW: 13.5 % (ref 11.5–15.5)
WBC Count: 3.5 10*3/uL — ABNORMAL LOW (ref 4.0–10.5)
nRBC: 0 % (ref 0.0–0.2)

## 2022-03-21 LAB — LACTATE DEHYDROGENASE: LDH: 208 U/L — ABNORMAL HIGH (ref 98–192)

## 2022-03-21 NOTE — Progress Notes (Signed)
?Hematology and Oncology Follow Up Visit ? ?Christine [name] ?[id number] ?12-Apr-1946 76 y.o. ?03/21/2022 ? ? ?Principle Diagnosis:  ?Cold Agglutinin Disease -- Recurrent ?COVID-19 (+) ? ?Current Therapy:   ?Rituxan/Bendamustine -- s/p cycle #4--completed on 01/29/2020  ?Folic acid 2 mg p.o. daily ? ?    ?Interim History:  Christine [name] is back for follow-up.  We last saw her back in January.  At that time, her cold agglutinin titer was 1: 4096.  I was worried that she was going to start relapsing.  However, she seems to be doing quite well.  Her hemoglobin is the best that it has been in quite a while. ? ?She feels okay.  She is trying to lose a little bit of weight. ? ?She still is working at the [employer] in [place].  My wife is in there all the time and she talks with Christine [name]. ? ?Her appetite is good.  She has had no nausea or vomiting.  There is been no change in bowel or bladder habits. ? ?She continues on her folic acid. ? ?She has had no problems with rashes.  There is been no leg swelling.   ? ?Medications:  ?Current Outpatient Medications:  ?  acetaminophen (TYLENOL) 500 MG tablet, Take 500-1,000 mg by mouth every 6 (six) hours as needed for mild pain or headache., Disp: , Rfl:  ?  amoxicillin (AMOXIL) 500 MG tablet, Take 500 mg by mouth. Before dental work, Disp: , Rfl:  ?  BIOTIN PO, Take 1 tablet by mouth daily with breakfast. (Patient not taking: Reported on 08/05/2021), Disp: , Rfl:  ?  buPROPion (WELLBUTRIN XL) 300 MG 24 hr tablet, Take 300 mg by mouth every morning.  (Patient not taking: Reported on 08/05/2021), Disp: , Rfl:  ?  Cholecalciferol (VITAMIN D3 MAXIMUM STRENGTH) 125 MCG (5000 UT) capsule, Take 5,000 Units by mouth daily., Disp: , Rfl:  ?  EPIPEN 2-PAK 0.3 MG/0.3ML SOAJ injection, Inject 0.3 mg into the muscle once as needed for anaphylaxis. , Disp: , Rfl: 6 ?  finasteride (PROSCAR) 5 MG tablet, Take 2.5 mg by mouth daily., Disp: , Rfl:  ?  folic acid (FOLVITE) 1 MG tablet, TAKE 2  TABLETS(2 MG) BY MOUTH DAILY, Disp: 180 tablet, Rfl: 3 ?  hydrochlorothiazide (MICROZIDE) 12.5 MG capsule, Take 12.5 mg by mouth daily., Disp: , Rfl: 11 ?  meclizine (ANTIVERT) 25 MG tablet, Take 25 mg by mouth as needed., Disp: , Rfl:  ?  rosuvastatin (CRESTOR) 20 MG tablet, Take 20 mg by mouth daily., Disp: , Rfl:  ?  SYNTHROID 88 MCG tablet, Take 88 mcg by mouth daily before breakfast. , Disp: , Rfl:  ?  zolpidem (AMBIEN) 10 MG tablet, Take 1 tablet (10 mg total) by mouth at bedtime., Disp: 30 tablet, Rfl: 3 ?No current facility-administered medications for this visit. ? ?Facility-Administered Medications Ordered in Other Visits:  ?  regadenoson (LEXISCAN) injection SOLN 0.4 mg, 0.4 mg, Intravenous, Once, Skeet Latch, MD ? ?Allergies:  ?Allergies  ?Allergen Reactions  ? Bee Venom Anaphylaxis and Shortness Of Breath  ? Losartan Potassium Other (See Comments)  ? Other Other (See Comments)  ? Varenicline Other (See Comments)  ? ? ?Past Medical History, Surgical history, Social history, and Family History were reviewed and updated. ? ?Review of Systems: ?Review of Systems  ?Constitutional:  Positive for fatigue.  ?HENT:  Negative.    ?Eyes: Negative.   ?Respiratory: Negative.    ?Cardiovascular: Negative.   ?Gastrointestinal: Negative.   ?  Endocrine: Negative.   ?Genitourinary: Negative.    ?Musculoskeletal: Negative.   ?Skin: Negative.   ?Neurological: Negative.   ?Hematological: Negative.   ?Psychiatric/Behavioral: Negative.    ? ?Physical Exam: ? weight is 135 lb (61.2 kg). Her oral temperature is 98 ?F (36.7 ?C). Her blood pressure is 109/74 and her pulse is 60. Her respiration is 16 and oxygen saturation is 100%.  ? ?Wt Readings from Last 3 Encounters:  ?03/21/22 135 lb (61.2 kg)  ?12/20/21 135 lb (61.2 kg)  ?09/13/21 134 lb (60.8 kg)  ? ? ?Physical Exam ?Vitals reviewed.  ?HENT:  ?   Head: Normocephalic and atraumatic.  ?Eyes:  ?   Pupils: Pupils are equal, round, and reactive to light.  ?Cardiovascular:   ?   Rate and Rhythm: Normal rate and regular rhythm.  ?   Heart sounds: Normal heart sounds.  ?Pulmonary:  ?   Effort: Pulmonary effort is normal.  ?   Breath sounds: Normal breath sounds.  ?Abdominal:  ?   General: Bowel sounds are normal.  ?   Palpations: Abdomen is soft.  ?Musculoskeletal:     ?   General: No tenderness or deformity. Normal range of motion.  ?   Cervical back: Normal range of motion.  ?Lymphadenopathy:  ?   Cervical: No cervical adenopathy.  ?Skin: ?   General: Skin is warm and dry.  ?   Findings: No erythema or rash.  ?Neurological:  ?   Mental Status: She is alert and oriented to person, place, and time.  ?Psychiatric:     ?   Behavior: Behavior normal.     ?   Thought Content: Thought content normal.     ?   Judgment: Judgment normal.  ? ? ? ?Lab Results  ?Component Value Date  ? WBC 3.5 (L) 03/21/2022  ? HGB 12.2 03/21/2022  ? HCT 35.3 (L) 03/21/2022  ? MCV 94.9 03/21/2022  ? PLT 218 03/21/2022  ? ?  Chemistry   ?   ?Component Value Date/Time  ? NA 132 (L) 03/21/2022 1212  ? NA 133 02/13/2014 1108  ? K 3.9 03/21/2022 1212  ? K 4.9 (H) 02/13/2014 1108  ? CL 98 03/21/2022 1212  ? CL 97 (L) 02/13/2014 1108  ? CO2 26 03/21/2022 1212  ? CO2 32 02/13/2014 1108  ? BUN 16 03/21/2022 1212  ? BUN 13 02/13/2014 1108  ? CREATININE 0.78 03/21/2022 1212  ? CREATININE 0.4 (L) 02/13/2014 1108  ?    ?Component Value Date/Time  ? CALCIUM 9.7 03/21/2022 1212  ? CALCIUM 9.3 02/13/2014 1108  ? ALKPHOS 56 03/21/2022 1212  ? ALKPHOS 63 02/13/2014 1108  ? AST 23 03/21/2022 1212  ? ALT 24 03/21/2022 1212  ? ALT 15 02/13/2014 1108  ? BILITOT 2.4 (H) 03/21/2022 1212  ?  ? ? ? ?Impression and Plan: ?Christine [name] is a 76 year old white female.  She is a history of cold agglutinin disease.  We treated her with Rituxan back in April 2015.  She tolerated this quite well. ? ?She then had a relapse.  We went ahead and treated her with Rituxan/bendamustine.  She had 4 cycles of treatment thus completed in February 2021. ? ?So  far, hemoglobin is actually doing quite nicely.  I think the warm weather and the fact that we had a warm winter certainly has helped.   ? ?We will now plan to get her back in 4 months. ? ?Again, if we have to retreat her, I  probably would use the monoclonal antibody- Enjaymo. ? ? ?Volanda Napoleon, MD ?4/10/20231:14 PM ?

## 2022-03-22 LAB — COLD AGGLUTININ TITER: Cold Agglutinin Titer: 1:2048 {titer} — ABNORMAL HIGH

## 2022-04-06 ENCOUNTER — Other Ambulatory Visit: Payer: Self-pay | Admitting: Internal Medicine

## 2022-04-06 DIAGNOSIS — I2510 Atherosclerotic heart disease of native coronary artery without angina pectoris: Secondary | ICD-10-CM

## 2022-04-06 DIAGNOSIS — I712 Thoracic aortic aneurysm, without rupture, unspecified: Secondary | ICD-10-CM

## 2022-05-10 ENCOUNTER — Ambulatory Visit
Admission: RE | Admit: 2022-05-10 | Discharge: 2022-05-10 | Disposition: A | Discharge status: Home / Self Care | Payer: Medicare Other | Source: Ambulatory Visit | Attending: Internal Medicine | Admitting: Internal Medicine

## 2022-05-10 DIAGNOSIS — I2510 Atherosclerotic heart disease of native coronary artery without angina pectoris: Secondary | ICD-10-CM

## 2022-05-10 DIAGNOSIS — I712 Thoracic aortic aneurysm, without rupture, unspecified: Secondary | ICD-10-CM

## 2022-05-10 MED ORDER — IOPAMIDOL (ISOVUE-370) INJECTION 76%
75.0000 mL | Freq: Once | INTRAVENOUS | Status: AC | PRN
  Administered 2022-05-10: 75 mL via INTRAVENOUS

## 2022-05-30 ENCOUNTER — Other Ambulatory Visit: Payer: Self-pay | Admitting: Pharmacist

## 2022-07-25 ENCOUNTER — Inpatient Hospital Stay: Payer: Medicare Other | Admitting: Hematology & Oncology

## 2022-07-25 ENCOUNTER — Inpatient Hospital Stay: Payer: Medicare Other

## 2022-09-12 ENCOUNTER — Other Ambulatory Visit: Payer: Self-pay

## 2022-09-12 ENCOUNTER — Encounter: Payer: Self-pay | Admitting: Family

## 2022-09-12 ENCOUNTER — Inpatient Hospital Stay: Payer: Medicare Other

## 2022-09-12 ENCOUNTER — Inpatient Hospital Stay: Payer: Medicare Other | Attending: Hematology & Oncology | Admitting: Family

## 2022-09-12 ENCOUNTER — Encounter: Payer: Self-pay | Admitting: *Deleted

## 2022-09-12 VITALS — BP 152/70 | HR 64 | Temp 97.7°F | Resp 18 | Ht 64.96 in | Wt 134.0 lb

## 2022-09-12 DIAGNOSIS — Z9221 Personal history of antineoplastic chemotherapy: Secondary | ICD-10-CM | POA: Diagnosis not present

## 2022-09-12 DIAGNOSIS — D509 Iron deficiency anemia, unspecified: Secondary | ICD-10-CM | POA: Diagnosis not present

## 2022-09-12 DIAGNOSIS — D5912 Cold autoimmune hemolytic anemia: Secondary | ICD-10-CM

## 2022-09-12 DIAGNOSIS — Z8616 Personal history of COVID-19: Secondary | ICD-10-CM | POA: Diagnosis not present

## 2022-09-12 LAB — RETICULOCYTES
Immature Retic Fract: 21.3 % — ABNORMAL HIGH (ref 2.3–15.9)
RBC.: 3.78 MIL/uL — ABNORMAL LOW (ref 3.87–5.11)
Retic Count, Absolute: 122.9 10*3/uL (ref 19.0–186.0)
Retic Ct Pct: 3.3 % — ABNORMAL HIGH (ref 0.4–3.1)

## 2022-09-12 LAB — IRON AND IRON BINDING CAPACITY (CC-WL,HP ONLY)
Iron: 98 ug/dL (ref 28–170)
Saturation Ratios: 35 % — ABNORMAL HIGH (ref 10.4–31.8)
TIBC: 277 ug/dL (ref 250–450)
UIBC: 179 ug/dL (ref 148–442)

## 2022-09-12 LAB — CBC WITH DIFFERENTIAL (CANCER CENTER ONLY)
Abs Immature Granulocytes: 0.02 10*3/uL (ref 0.00–0.07)
Basophils Absolute: 0.1 10*3/uL (ref 0.0–0.1)
Basophils Relative: 2 %
Eosinophils Absolute: 0.1 10*3/uL (ref 0.0–0.5)
Eosinophils Relative: 3 %
HCT: 35 % — ABNORMAL LOW (ref 36.0–46.0)
Hemoglobin: 12.1 g/dL (ref 12.0–15.0)
Immature Granulocytes: 1 %
Lymphocytes Relative: 16 %
Lymphs Abs: 0.5 10*3/uL — ABNORMAL LOW (ref 0.7–4.0)
MCH: 32.2 pg (ref 26.0–34.0)
MCHC: 34.6 g/dL (ref 30.0–36.0)
MCV: 93.1 fL (ref 80.0–100.0)
Monocytes Absolute: 0.3 10*3/uL (ref 0.1–1.0)
Monocytes Relative: 8 %
Neutro Abs: 2.4 10*3/uL (ref 1.7–7.7)
Neutrophils Relative %: 70 %
Platelet Count: 195 10*3/uL (ref 150–400)
RBC: 3.76 MIL/uL — ABNORMAL LOW (ref 3.87–5.11)
RDW: 13.6 % (ref 11.5–15.5)
WBC Count: 3.4 10*3/uL — ABNORMAL LOW (ref 4.0–10.5)
nRBC: 0 % (ref 0.0–0.2)

## 2022-09-12 LAB — FERRITIN: Ferritin: 309 ng/mL — ABNORMAL HIGH (ref 11–307)

## 2022-09-12 LAB — CMP (CANCER CENTER ONLY)
ALT: 26 U/L (ref 0–44)
AST: 22 U/L (ref 15–41)
Albumin: 4.5 g/dL (ref 3.5–5.0)
Alkaline Phosphatase: 55 U/L (ref 38–126)
Anion gap: 7 (ref 5–15)
BUN: 17 mg/dL (ref 8–23)
CO2: 28 mmol/L (ref 22–32)
Calcium: 9.4 mg/dL (ref 8.9–10.3)
Chloride: 98 mmol/L (ref 98–111)
Creatinine: 0.75 mg/dL (ref 0.44–1.00)
GFR, Estimated: >60 mL/min (ref >60)
Glucose, Bld: 105 mg/dL — ABNORMAL HIGH (ref 70–99)
Potassium: 3.6 mmol/L (ref 3.5–5.1)
Sodium: 133 mmol/L — ABNORMAL LOW (ref 135–145)
Total Bilirubin: 1.6 mg/dL — ABNORMAL HIGH (ref 0.3–1.2)
Total Protein: 7.2 g/dL (ref 6.5–8.1)

## 2022-09-12 LAB — LACTATE DEHYDROGENASE: LDH: 192 U/L (ref 98–192)

## 2022-09-12 NOTE — Progress Notes (Unsigned)
Hematology and Oncology Follow Up Visit  CHAELYN BUNYAN [id number] 14-Jan-1946 76 y.o. 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 daily   Interim History:  Ms. Stuckert is here today for follow-up. She is doing quite well and staying busy with work.  She denies fatigue at this time.  Hgb is stable at 12.1, platelets 195 and WBC count 3.4.  No issue with infection. No fever, chills, n/v, cough, rash, dizziness, SOB, chest pain, palpitations, abdominal pain or changes in bowel or bladder habits.  No blood loss noted. No bruising or petechiae.  No swelling, tenderness, numbness or tingling in her extremities.  No falls or syncope.  Appetite and hydration are good. Weight is stable at 134 lbs.   ECOG Performance Status: 1 - Symptomatic but completely ambulatory  Medications:  Allergies as of 09/12/2022       Reactions   Bee Venom Anaphylaxis, Shortness Of Breath   Losartan Potassium Other (See Comments)   Other Other (See Comments)   Varenicline Other (See Comments)        Medication List        Accurate as of September 12, 2022  1:30 PM. If you have any questions, ask your nurse or doctor.          acetaminophen 500 MG tablet Commonly known as: TYLENOL Take 500-1,000 mg by mouth every 6 (six) hours as needed for mild pain or headache.   amoxicillin 500 MG tablet Commonly known as: AMOXIL Take 500 mg by mouth. Before dental work   BIOTIN PO Take 1 tablet by mouth daily with breakfast.   buPROPion 300 MG 24 hr tablet Commonly known as: WELLBUTRIN XL Take 300 mg by mouth every morning.   EpiPen 2-Pak 0.3 mg/0.3 mL Soaj injection Generic drug: EPINEPHrine Inject 0.3 mg into the muscle once as needed for anaphylaxis.   finasteride 5 MG tablet Commonly known as: PROSCAR Take 2.5 mg by mouth daily.   folic acid  1 MG tablet Commonly known as: FOLVITE TAKE 2 TABLETS(2 MG) BY MOUTH DAILY   hydrochlorothiazide 12.5 MG capsule Commonly known as: MICROZIDE Take 12.5 mg by mouth daily.   meclizine 25 MG tablet Commonly known as: ANTIVERT Take 25 mg by mouth as needed.   rosuvastatin 20 MG tablet Commonly known as: CRESTOR Take 20 mg by mouth daily.   Synthroid 88 MCG tablet Generic drug: levothyroxine Take 88 mcg by mouth daily before breakfast.   Vitamin D3 Maximum Strength 125 MCG (5000 UT) capsule Generic drug: Cholecalciferol Take 5,000 Units by mouth daily.   zolpidem 10 MG tablet Commonly known as: AMBIEN Take 1 tablet (10 mg total) by mouth at bedtime.        Allergies:  Allergies  Allergen Reactions   Bee Venom Anaphylaxis and Shortness Of Breath   Losartan Potassium Other (See Comments)   Other Other (See Comments)   Varenicline Other (See Comments)    Past Medical History, Surgical history, Social history, and Family History were reviewed and updated.  Review of Systems: All other 10 point review of systems is negative.   Physical Exam:  height is 5' 4.96" (1.65 m) and weight is 134 lb (60.8 kg). Her oral temperature is 97.7 F (36.5 C). Her blood pressure is 152/70 (abnormal) and her pulse is 64. Her respiration is 18  and oxygen saturation is 95%.   Wt Readings from Last 3 Encounters:  09/12/22 134 lb (60.8 kg)  03/21/22 135 lb (61.2 kg)  12/20/21 135 lb (61.2 kg)    Ocular: Sclerae unicteric, pupils equal, round and reactive to light Ear-nose-throat: Oropharynx clear, dentition fair Lymphatic: No cervical or supraclavicular adenopathy Lungs no rales or rhonchi, good excursion bilaterally Heart regular rate and rhythm, no murmur appreciated Abd soft, nontender, positive bowel sounds MSK no focal spinal tenderness, no joint edema Neuro: non-focal, well-oriented, appropriate affect Breasts: Deferred  Lab Results  Component Value Date   WBC 3.4 (L)  09/12/2022   HGB 12.1 09/12/2022   HCT 35.0 (L) 09/12/2022   MCV 93.1 09/12/2022   PLT 195 09/12/2022   Lab Results  Component Value Date   FERRITIN 403 (H) 12/20/2021   IRON 98 09/12/2022   TIBC 277 09/12/2022   UIBC 179 09/12/2022   IRONPCTSAT 35 (H) 09/12/2022   Lab Results  Component Value Date   RETICCTPCT 3.3 (H) 09/12/2022   RBC 3.78 (L) 09/12/2022   RBC 3.76 (L) 09/12/2022   RETICCTABS 155.3 11/23/2015   Lab Results  Component Value Date   KPAFRELGTCHN 128.5 (H) 10/08/2019   LAMBDASER 20.9 10/08/2019   KAPLAMBRATIO 6.15 (H) 10/08/2019   Lab Results  Component Value Date   IGGSERUM 648 10/08/2019   IGA 104 10/08/2019   IGMSERUM 454 (H) 10/08/2019   Lab Results  Component Value Date   TOTALPROTELP 5.5 (L) 10/08/2019   ALBUMINELP 3.2 10/08/2019   A1GS 0.3 10/08/2019   A2GS 0.5 10/08/2019   BETS 1.0 10/08/2019   GAMS 0.6 10/08/2019   MSPIKE Not Observed 10/08/2019   SPEI Comment 10/08/2019     Chemistry      Component Value Date/Time   NA 133 (L) 09/12/2022 0912   NA 133 02/13/2014 1108   K 3.6 09/12/2022 0912   K 4.9 (H) 02/13/2014 1108   CL 98 09/12/2022 0912   CL 97 (L) 02/13/2014 1108   CO2 28 09/12/2022 0912   CO2 32 02/13/2014 1108   BUN 17 09/12/2022 0912   BUN 13 02/13/2014 1108   CREATININE 0.75 09/12/2022 0912   CREATININE 0.4 (L) 02/13/2014 1108      Component Value Date/Time   CALCIUM 9.4 09/12/2022 0912   CALCIUM 9.3 02/13/2014 1108   ALKPHOS 55 09/12/2022 0912   ALKPHOS 63 02/13/2014 1108   AST 22 09/12/2022 0912   ALT 26 09/12/2022 0912   ALT 15 02/13/2014 1108   BILITOT 1.6 (H) 09/12/2022 0912       Impression and Plan: Ms. Ringel is a very pleasant 76 yo caucasian female with history of cold agglutinin disease treated with Rituxan in April 2015. She then relapsed in February 2021 and received treatment with Rituxan and Bendamustine.  Cold Agglutinin pending.  Hgb is stable at 12.1! Follow-up in 6 months.   Lottie Dawson, NP 10/2/20231:30 PM

## 2022-09-21 LAB — COLD AGGLUTININ TITER: Cold Agglutinin Titer: 1:2048 {titer} — ABNORMAL HIGH

## 2022-10-26 ENCOUNTER — Ambulatory Visit: Payer: Medicare Other | Attending: Cardiovascular Disease | Admitting: Cardiovascular Disease

## 2022-10-26 ENCOUNTER — Encounter: Payer: Self-pay | Admitting: Cardiovascular Disease

## 2022-10-26 VITALS — BP 124/80 | HR 57 | Ht 64.96 in | Wt 133.6 lb

## 2022-10-26 DIAGNOSIS — I7121 Aneurysm of the ascending aorta, without rupture: Secondary | ICD-10-CM | POA: Diagnosis not present

## 2022-10-26 DIAGNOSIS — I2510 Atherosclerotic heart disease of native coronary artery without angina pectoris: Secondary | ICD-10-CM | POA: Diagnosis not present

## 2022-10-26 DIAGNOSIS — I10 Essential (primary) hypertension: Secondary | ICD-10-CM

## 2022-10-26 DIAGNOSIS — I351 Nonrheumatic aortic (valve) insufficiency: Secondary | ICD-10-CM

## 2022-10-26 NOTE — Progress Notes (Signed)
Chief Complaint  Patient presents with   Follow-up    Aortic insufficiency    History of Present Illness: 76 yo Christine [name] with history of cold agglutinin hemolytic anemia, arthritis, prior DVT, anxiety/depression, vertigo, HTN, thoracic aortic aneurysm, hypothyroidism and CAD with finding of coronary calcium on non-contrast chest CT who is here today for cardiac follow up. I saw her as a new consult in August 2022 to discuss her abnormal coronary calcium score. She is known to have mild dilation of her ascending aorta (4.1 cm in 2020 and 4.3 cm in May [id number]). Coronary calcium score of 2770 in May 2022 (99th percentile). Echo September 2022 with LVEF=60-65%. Grade 1 diastolic dysfunction. Mild to moderate AI. Nuclear stress test 08/31/21 with no evidence of ischemia.   She is here today for follow up. The patient denies any chest pain, dyspnea, palpitations, lower extremity edema, orthopnea, PND, dizziness, near syncope or syncope.   Primary Care Physician:Shaw, Emily Filbert., MD  Past Medical History:  Diagnosis Date   Anxiety    Arthritis    hands   Cold agglutinin disease ([place]) 12/23/2013   Deep venous thrombosis of calf ([place]) 2002   Depression    Diverticulosis    Goals of care, counseling/discussion 10/11/2019   History of blood transfusion 2009   Hypertension    Osteopenia    PONV (postoperative nausea and vomiting)    nausea only yrs ago   Tubular adenoma of colon 2011    Past Surgical History:  Procedure Laterality Date   ABDOMINAL HYSTERECTOMY  age 17   cataract surgery Bilateral 2011   CHOLECYSTECTOMY N/A 09/20/2013   Procedure: LAPAROSCOPIC CHOLECYSTECTOMY WITH INTRAOPERATIVE CHOLANGIOGRAM;  Surgeon: Odis Hollingshead, MD;  Location: [facility];  Service: General;  Laterality: N/A;   KNEE ARTHROSCOPY     left knee   REPLACEMENT TOTAL KNEE Left 2006    Current Outpatient Medications  Medication Sig Dispense Refill   acetaminophen (TYLENOL) 500 MG tablet Take  500-1,000 mg by mouth every 6 (six) hours as needed for mild pain or headache.     amoxicillin (AMOXIL) 500 MG tablet Take 500 mg by mouth. Before dental work     BIOTIN PO Take 1 tablet by mouth daily with breakfast.     buPROPion (WELLBUTRIN XL) 300 MG 24 hr tablet Take 300 mg by mouth every morning.     Cholecalciferol (VITAMIN D3 MAXIMUM STRENGTH) 125 MCG (5000 UT) capsule Take 5,000 Units by mouth daily.     EPIPEN 2-PAK 0.3 MG/0.3ML SOAJ injection Inject 0.3 mg into the muscle once as needed for anaphylaxis.  6   finasteride (PROSCAR) 5 MG tablet Take 2.5 mg by mouth daily.     folic acid (FOLVITE) 1 MG tablet TAKE 2 TABLETS(2 MG) BY MOUTH DAILY 180 tablet 3   hydrochlorothiazide (MICROZIDE) 12.5 MG capsule Take 12.5 mg by mouth daily.  11   meclizine (ANTIVERT) 25 MG tablet Take 25 mg by mouth as needed.     rosuvastatin (CRESTOR) 20 MG tablet Take 20 mg by mouth daily.     SYNTHROID 88 MCG tablet Take 88 mcg by mouth daily before breakfast.      zolpidem (AMBIEN) 10 MG tablet Take 1 tablet (10 mg total) by mouth at bedtime. 30 tablet 3   No current facility-administered medications for this visit.   Facility-Administered Medications Ordered in Other Visits  Medication Dose Route Frequency Provider Last Rate Last Admin   regadenoson (LEXISCAN) injection SOLN 0.4 mg  0.4 mg Intravenous Once Skeet Latch, MD        Allergies  Allergen Reactions   Bee Venom Anaphylaxis and Shortness Of Breath   Losartan Potassium Other (See Comments)   Other Other (See Comments)   Varenicline Other (See Comments)    Social History   Socioeconomic History   Marital status: Married    Spouse name: Not on file   Number of children: 3   Years of education: Not on file   Highest education level: Not on file  Occupational History   Occupation: Self employeed   Tobacco Use   Smoking status: Former    Packs/day: 0.50    Years: 20.00    Total pack years: 10.00    Types: Cigarettes     Start date: 01/28/1964    Quit date: 09/02/2009    Years since quitting: 13.1   Smokeless tobacco: Never   Tobacco comments:    quit 5 years ago  Vaping Use   Vaping Use: Never used  Substance and Sexual Activity   Alcohol use: Yes    Alcohol/week: 5.0 standard drinks of alcohol    Types: 5 Glasses of wine per week    Comment: 2 glasses wine per day   Drug use: No   Sexual activity: Not on file  Other Topics Concern   Not on file  Social History Narrative   Not on file   Social Determinants of Health   Financial Resource Strain: Not on file  Food Insecurity: Not on file  Transportation Needs: Not on file  Physical Activity: Not on file  Stress: Not on file  Social Connections: Not on file  Intimate Partner Violence: Not on file    Family History  Problem Relation Age of Onset   Hypertension Mother    Alcohol abuse Father    Heart attack Father        died age 94   Heart disease Brother    Liver cancer Maternal Grandfather    Heart attack Maternal Grandmother     Review of Systems:  As stated in the HPI and otherwise negative.   BP 124/80   Pulse (!) 57   Ht 5' 4.96" (1.65 m)   Wt 133 lb 9.6 oz (60.6 kg)   SpO2 99%   BMI 22.26 kg/m   Physical Examination: General: Well developed, well nourished, NAD  HEENT: OP clear, mucus membranes moist  SKIN: warm, dry. No rashes. Neuro: No focal deficits  Musculoskeletal: Muscle strength 5/5 all ext  Psychiatric: Mood and affect normal  Neck: No JVD, no carotid bruits, no thyromegaly, no lymphadenopathy.  Lungs:Clear bilaterally, no wheezes, rhonci, crackles Cardiovascular: Regular rate and rhythm. No murmurs, gallops or rubs. Abdomen:Soft. Bowel sounds present. Non-tender.  Extremities: No lower extremity edema. Pulses are 2 + in the bilateral DP/PT.  EKG:  EKG is ordered today. The ekg ordered today demonstrates Sinus brady, rate 57 bpm  Echo 08/31/21:  1. Aortic dilatation noted. There is mild dilatation of the  ascending  aorta, measuring 42 mm.   2. Left ventricular ejection fraction, by estimation, is 60 to 65%. The  left ventricle has normal function. The left ventricle has no regional  wall motion abnormalities. Left ventricular diastolic parameters are  consistent with Grade I diastolic  dysfunction (impaired relaxation). The average left ventricular global  longitudinal strain is -18.7 %. The global longitudinal strain is normal.   3. Right ventricular systolic function is normal. The right ventricular  size is  normal. There is normal pulmonary artery systolic pressure. The  estimated right ventricular systolic pressure is 36.1 mmHg.   4. The mitral valve is normal in structure. No evidence of mitral valve  regurgitation. No evidence of mitral stenosis.   5. Tricuspid valve regurgitation is moderate.   6. The aortic valve is normal in structure. Aortic valve regurgitation is  mild to moderate. No aortic stenosis is present.   7. Pulmonic valve regurgitation is moderate.   8. Aortic dilatation noted. There is mild dilatation of the ascending  aorta, measuring 42 mm.   9. The inferior vena cava is normal in size with greater than 50%  respiratory variability, suggesting right atrial pressure of 3 mmHg.   Recent Labs: 09/12/2022: ALT 26; BUN 17; Creatinine 0.75; Hemoglobin 12.1; Platelet Count 195; Potassium 3.6; Sodium 133   Lipid Panel No results found for: "CHOL", "TRIG", "HDL", "CHOLHDL", "VLDL", "LDLCALC", "LDLDIRECT"   Wt Readings from Last 3 Encounters:  10/26/22 133 lb 9.6 oz (60.6 kg)  09/12/22 134 lb (60.8 kg)  03/21/22 135 lb (61.2 kg)    Assessment and Plan:   1. CAD without angina: No chest pain. Abnormal coronary calcium score in 2022. Nuclear stress test in 2022 without ischemia. Echo with normal LV systolic function. Continue ASA and statin.    2. HTN: BP is well controlled. No changes today.   3. Aortic insufficiency: Mild to moderate AI by echo in 2022. Repeat in  September 2024.   4. Thoracic aortic aneurysm: 4.3 cm by Chest CTA in may 2023. Repeat in May 2024. Arranged by Dr. Brigitte Pulse.   Labs/ tests ordered today include:   Orders Placed This Encounter  Procedures   EKG 12-Lead   ECHOCARDIOGRAM COMPLETE   Disposition:   F/U with me in one year.    Signed, Lauree Chandler, MD 10/26/2022 4:07 PM    [facility] [place], [address], [place]  [address] Phone: [phone]; Fax: [phone]

## 2022-10-26 NOTE — Patient Instructions (Signed)
Medication Instructions:  No changes *If you need a refill on your cardiac medications before your next appointment, please call your pharmacy*   Lab Work: none If you have labs (blood work) drawn today and your tests are completely normal, you will receive your results only by: [facility] (if you have MyChart) OR A paper copy in the mail If you have any lab test that is abnormal or we need to change your treatment, we will call you to review the results.   Testing/Procedures: ECHO DUE IN SEPT 2024 Your physician has requested that you have an echocardiogram. Echocardiography is a painless test that uses sound waves to create images of your heart. It provides your doctor with information about the size and shape of your heart and how well your heart's chambers and valves are working. This procedure takes approximately one hour. There are no restrictions for this procedure. Please do NOT wear cologne, perfume, aftershave, or lotions (deodorant is allowed). Please arrive 15 minutes prior to your appointment time.   Follow-Up: At [facility], you and your health needs are our priority.  As part of our continuing mission to provide you with exceptional heart care, we have created designated Provider Care Teams.  These Care Teams include your primary Cardiologist (physician) and Advanced Practice Providers (APPs -  Physician Assistants and Nurse Practitioners) who all work together to provide you with the care you need, when you need it.    Your next appointment:   12 month(s)  The format for your next appointment:   In Person  Provider:   Lauree Chandler, MD     Other Instructions   Important Information About Sugar

## 2022-11-29 ENCOUNTER — Other Ambulatory Visit: Payer: Self-pay | Admitting: Hematology & Oncology

## 2022-11-29 DIAGNOSIS — D500 Iron deficiency anemia secondary to blood loss (chronic): Secondary | ICD-10-CM

## 2023-03-13 ENCOUNTER — Inpatient Hospital Stay: Payer: Medicare Other | Admitting: Hematology & Oncology

## 2023-03-13 ENCOUNTER — Encounter: Payer: Self-pay | Admitting: Hematology & Oncology

## 2023-03-13 ENCOUNTER — Other Ambulatory Visit: Payer: Self-pay

## 2023-03-13 ENCOUNTER — Inpatient Hospital Stay: Payer: Medicare Other | Attending: Hematology & Oncology

## 2023-03-13 VITALS — BP 148/70 | HR 71 | Temp 97.8°F | Resp 18 | Ht 64.96 in | Wt 134.0 lb

## 2023-03-13 DIAGNOSIS — Z79899 Other long term (current) drug therapy: Secondary | ICD-10-CM | POA: Insufficient documentation

## 2023-03-13 DIAGNOSIS — Z8616 Personal history of COVID-19: Secondary | ICD-10-CM | POA: Diagnosis not present

## 2023-03-13 DIAGNOSIS — Z79890 Hormone replacement therapy (postmenopausal): Secondary | ICD-10-CM | POA: Insufficient documentation

## 2023-03-13 DIAGNOSIS — D5912 Cold autoimmune hemolytic anemia: Secondary | ICD-10-CM | POA: Diagnosis not present

## 2023-03-13 DIAGNOSIS — D509 Iron deficiency anemia, unspecified: Secondary | ICD-10-CM

## 2023-03-13 LAB — CBC WITH DIFFERENTIAL (CANCER CENTER ONLY)
Abs Immature Granulocytes: 0.03 10*3/uL (ref 0.00–0.07)
Basophils Absolute: 0.1 10*3/uL (ref 0.0–0.1)
Basophils Relative: 1 %
Eosinophils Absolute: 0.1 10*3/uL (ref 0.0–0.5)
Eosinophils Relative: 2 %
HCT: 35.5 % — ABNORMAL LOW (ref 36.0–46.0)
Hemoglobin: 12 g/dL (ref 12.0–15.0)
Immature Granulocytes: 1 %
Lymphocytes Relative: 13 %
Lymphs Abs: 0.7 10*3/uL (ref 0.7–4.0)
MCH: 31.4 pg (ref 26.0–34.0)
MCHC: 33.8 g/dL (ref 30.0–36.0)
MCV: 92.9 fL (ref 80.0–100.0)
Monocytes Absolute: 0.3 10*3/uL (ref 0.1–1.0)
Monocytes Relative: 6 %
Neutro Abs: 4.3 10*3/uL (ref 1.7–7.7)
Neutrophils Relative %: 77 %
Platelet Count: 226 10*3/uL (ref 150–400)
RBC: 3.82 MIL/uL — ABNORMAL LOW (ref 3.87–5.11)
RDW: 13.4 % (ref 11.5–15.5)
WBC Count: 5.5 10*3/uL (ref 4.0–10.5)
nRBC: 0 % (ref 0.0–0.2)

## 2023-03-13 LAB — RETICULOCYTES
Immature Retic Fract: 17.8 % — ABNORMAL HIGH (ref 2.3–15.9)
RBC.: 3.78 MIL/uL — ABNORMAL LOW (ref 3.87–5.11)
Retic Count, Absolute: 130.4 10*3/uL (ref 19.0–186.0)
Retic Ct Pct: 3.5 % — ABNORMAL HIGH (ref 0.4–3.1)

## 2023-03-13 LAB — CMP (CANCER CENTER ONLY)
ALT: 19 U/L (ref 0–44)
AST: 19 U/L (ref 15–41)
Albumin: 4.7 g/dL (ref 3.5–5.0)
Alkaline Phosphatase: 77 U/L (ref 38–126)
Anion gap: 10 (ref 5–15)
BUN: 18 mg/dL (ref 8–23)
CO2: 26 mmol/L (ref 22–32)
Calcium: 9.2 mg/dL (ref 8.9–10.3)
Chloride: 99 mmol/L (ref 98–111)
Creatinine: 0.75 mg/dL (ref 0.44–1.00)
GFR, Estimated: >60 mL/min (ref >60)
Glucose, Bld: 110 mg/dL — ABNORMAL HIGH (ref 70–99)
Potassium: 3.2 mmol/L — ABNORMAL LOW (ref 3.5–5.1)
Sodium: 135 mmol/L (ref 135–145)
Total Bilirubin: 1.5 mg/dL — ABNORMAL HIGH (ref 0.3–1.2)
Total Protein: 6.5 g/dL (ref 6.5–8.1)

## 2023-03-13 LAB — FERRITIN: Ferritin: 374 ng/mL — ABNORMAL HIGH (ref 11–307)

## 2023-03-13 LAB — LACTATE DEHYDROGENASE: LDH: 207 U/L — ABNORMAL HIGH (ref 98–192)

## 2023-03-13 NOTE — Progress Notes (Signed)
Hematology and Oncology Follow Up Visit  Christine [name] [id number] July 10, 1946 77 y.o. 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 daily      Interim History:  Christine [name] is back for follow-up.  We saw her 6 months ago.  Since then, she has been doing pretty well.  As always, she is working at [employer].  It is no surprise that this is where Christine [name] likes to shop.  She is done nicely.  She did have a hide cold agglutinin titer of 1: 2048 when we saw her back in October.  She is on folic acid.  She is doing well with folic acid..  She has had no problems with fatigue or weakness.  She has had no rashes.  There has been no leg swelling.  She has had no cough or shortness of breath.  There is been no issues with pneumonia or COVID.    Her last iron studies that were done back in October showed a ferritin of 309 with an iron saturation of 35%.    Currently, I would say that her performance status is ECOG 1.   Medications:  Current Outpatient Medications:    MACROBID 100 MG capsule, Take 100 mg by mouth 2 (two) times daily., Disp: , Rfl:    acetaminophen (TYLENOL) 500 MG tablet, Take 500-1,000 mg by mouth every 6 (six) hours as needed for mild pain or headache., Disp: , Rfl:    amoxicillin (AMOXIL) 500 MG tablet, Take 500 mg by mouth. Before dental work, Disp: , Rfl:    BIOTIN PO, Take 1 tablet by mouth daily with breakfast., Disp: , Rfl:    buPROPion (WELLBUTRIN XL) 300 MG 24 hr tablet, Take 300 mg by mouth every morning., Disp: , Rfl:    Cholecalciferol (VITAMIN D3 MAXIMUM STRENGTH) 125 MCG (5000 UT) capsule, Take 5,000 Units by mouth daily., Disp: , Rfl:    EPIPEN 2-PAK 0.3 MG/0.3ML SOAJ injection, Inject 0.3 mg into the muscle once as needed for anaphylaxis., Disp: , Rfl: 6   finasteride (PROSCAR) 5 MG tablet, Take 2.5 mg  by mouth daily., Disp: , Rfl:    folic acid (FOLVITE) 1 MG tablet, TAKE 2 TABLETS BY MOUTH EVERY DAY, Disp: 360 tablet, Rfl: 0   hydrochlorothiazide (MICROZIDE) 12.5 MG capsule, Take 12.5 mg by mouth daily., Disp: , Rfl: 11   meclizine (ANTIVERT) 25 MG tablet, Take 25 mg by mouth as needed., Disp: , Rfl:    rosuvastatin (CRESTOR) 20 MG tablet, Take 20 mg by mouth daily., Disp: , Rfl:    SYNTHROID 88 MCG tablet, Take 88 mcg by mouth daily before breakfast. , Disp: , Rfl:    zolpidem (AMBIEN) 10 MG tablet, Take 1 tablet (10 mg total) by mouth at bedtime., Disp: 30 tablet, Rfl: 3 No current facility-administered medications for this visit.  Facility-Administered Medications Ordered in Other Visits:    regadenoson (LEXISCAN) injection SOLN 0.4 mg, 0.4 mg, Intravenous, Once, Skeet Latch, MD  Allergies:  Allergies  Allergen Reactions   Bee Venom Anaphylaxis and Shortness Of Breath   Losartan Potassium Other (See Comments)   Other Other (See Comments)   Varenicline Other (See Comments)    Past Medical History, Surgical history, Social history, and Family History were reviewed and updated.  Review of Systems: Review of Systems  Constitutional:  Positive for fatigue.  HENT:  Negative.    Eyes: Negative.   Respiratory: Negative.    Cardiovascular: Negative.   Gastrointestinal: Negative.   Endocrine: Negative.   Genitourinary: Negative.    Musculoskeletal: Negative.   Skin: Negative.   Neurological: Negative.   Hematological: Negative.   Psychiatric/Behavioral: Negative.      Physical Exam:  height is 5' 4.96" (1.65 m) and weight is 134 lb (60.8 kg). Her oral temperature is 97.8 F (36.6 C). Her blood pressure is 148/70 (abnormal) and her pulse is 71. Her respiration is 18 and oxygen saturation is 100%.   Wt Readings from Last 3 Encounters:  03/13/23 134 lb (60.8 kg)  10/26/22 133 lb 9.6 oz (60.6 kg)  09/12/22 134 lb (60.8 kg)    Physical Exam Vitals reviewed.  HENT:      Head: Normocephalic and atraumatic.  Eyes:     Pupils: Pupils are equal, round, and reactive to light.  Cardiovascular:     Rate and Rhythm: Normal rate and regular rhythm.     Heart sounds: Normal heart sounds.  Pulmonary:     Effort: Pulmonary effort is normal.     Breath sounds: Normal breath sounds.  Abdominal:     General: Bowel sounds are normal.     Palpations: Abdomen is soft.  Musculoskeletal:        General: No tenderness or deformity. Normal range of motion.     Cervical back: Normal range of motion.  Lymphadenopathy:     Cervical: No cervical adenopathy.  Skin:    General: Skin is warm and dry.     Findings: No erythema or rash.  Neurological:     Mental Status: She is alert and oriented to person, place, and time.  Psychiatric:        Behavior: Behavior normal.        Thought Content: Thought content normal.        Judgment: Judgment normal.     Lab Results  Component Value Date   WBC 5.5 03/13/2023   HGB 12.0 03/13/2023   HCT 35.5 (L) 03/13/2023   MCV 92.9 03/13/2023   PLT 226 03/13/2023     Chemistry      Component Value Date/Time   NA 133 (L) 09/12/2022 0912   NA 133 02/13/2014 1108   K 3.6 09/12/2022 0912   K 4.9 (H) 02/13/2014 1108   CL 98 09/12/2022 0912   CL 97 (L) 02/13/2014 1108   CO2 28 09/12/2022 0912   CO2 32 02/13/2014 1108   BUN 17 09/12/2022 0912   BUN 13 02/13/2014 1108   CREATININE 0.75 09/12/2022 0912   CREATININE 0.4 (L) 02/13/2014 1108      Component Value Date/Time   CALCIUM 9.4 09/12/2022 0912   CALCIUM 9.3 02/13/2014 1108   ALKPHOS 55 09/12/2022 0912   ALKPHOS 63 02/13/2014 1108   AST 22 09/12/2022 0912   ALT 26 09/12/2022 0912   ALT 15 02/13/2014 1108   BILITOT 1.6 (H) 09/12/2022 0912       Impression and Plan: Christine [name] is a 77 year old white female.  She is a history of cold agglutinin disease.  We treated her with Rituxan back in April 2015.  She tolerated this quite well.  She then had a relapse.  We  went ahead and treated her with Rituxan/bendamustine.  She had 4 cycles of treatment thus completed in February 2021.  Everything is doing quite well right now.  Her hemoglobin is holding steady.  She is  does not have a elevated reticulocyte count.  Will be interesting to see what the cold agglutinin titer is.  I think that as long she is on folic acid, she should be doing pretty well.  We will plan to get her back in 6 more months.  I would like to hope that the summer months will be good for her blood.   Volanda Napoleon, MD 4/1/20241:55 PM

## 2023-03-14 ENCOUNTER — Other Ambulatory Visit: Payer: Self-pay | Admitting: Hematology & Oncology

## 2023-03-14 DIAGNOSIS — D500 Iron deficiency anemia secondary to blood loss (chronic): Secondary | ICD-10-CM

## 2023-03-14 LAB — IRON AND IRON BINDING CAPACITY (CC-WL,HP ONLY)
Iron: 168 ug/dL (ref 28–170)
Saturation Ratios: 59 % — ABNORMAL HIGH (ref 10.4–31.8)
TIBC: 286 ug/dL (ref 250–450)
UIBC: 118 ug/dL — ABNORMAL LOW (ref 148–442)

## 2023-03-15 ENCOUNTER — Telehealth: Payer: Self-pay

## 2023-03-15 LAB — COLD AGGLUTININ TITER: Cold Agglutinin Titer: 1:1024 {titer} — ABNORMAL HIGH

## 2023-03-15 NOTE — Telephone Encounter (Signed)
-----   Message from Volanda Napoleon, MD sent at 03/15/2023  4:05 PM EDT ----- Please call and let her know that the cold agglutinin titer is actually down by half.  This is certainly encouraging.  Thanks.  Laurey Arrow

## 2023-04-17 ENCOUNTER — Other Ambulatory Visit: Payer: Self-pay | Admitting: Internal Medicine

## 2023-04-17 DIAGNOSIS — I7121 Aneurysm of the ascending aorta, without rupture: Secondary | ICD-10-CM

## 2023-05-29 ENCOUNTER — Ambulatory Visit
Admission: RE | Admit: 2023-05-29 | Discharge: 2023-05-29 | Disposition: A | Discharge status: Home / Self Care | Payer: Medicare Other | Source: Ambulatory Visit | Attending: Internal Medicine | Admitting: Internal Medicine

## 2023-05-29 DIAGNOSIS — I7121 Aneurysm of the ascending aorta, without rupture: Secondary | ICD-10-CM

## 2023-05-29 MED ORDER — IOPAMIDOL (ISOVUE-370) INJECTION 76%
75.0000 mL | Freq: Once | INTRAVENOUS | Status: AC | PRN
  Administered 2023-05-29: 75 mL via INTRAVENOUS

## 2023-08-11 ENCOUNTER — Other Ambulatory Visit: Payer: Self-pay | Admitting: Hematology & Oncology

## 2023-08-11 DIAGNOSIS — D500 Iron deficiency anemia secondary to blood loss (chronic): Secondary | ICD-10-CM

## 2023-08-21 ENCOUNTER — Other Ambulatory Visit (HOSPITAL_COMMUNITY): Payer: Medicare Other

## 2023-08-28 ENCOUNTER — Ambulatory Visit (HOSPITAL_COMMUNITY): Payer: Medicare Other | Attending: Cardiovascular Disease

## 2023-08-28 DIAGNOSIS — I2510 Atherosclerotic heart disease of native coronary artery without angina pectoris: Secondary | ICD-10-CM | POA: Insufficient documentation

## 2023-08-28 DIAGNOSIS — I351 Nonrheumatic aortic (valve) insufficiency: Secondary | ICD-10-CM | POA: Insufficient documentation

## 2023-08-28 LAB — ECHOCARDIOGRAM COMPLETE
Area-P 1/2: 2.42 cm2
P 1/2 time: 603 ms
S' Lateral: 2.4 cm

## 2023-09-12 ENCOUNTER — Inpatient Hospital Stay: Payer: Medicare Other

## 2023-09-12 ENCOUNTER — Ambulatory Visit: Payer: Medicare Other | Admitting: Hematology & Oncology

## 2023-10-08 NOTE — Progress Notes (Unsigned)
Cardiology Office Note:   Date:  10/09/2023  ID:  Christine [name], DOB July 14, 1946, MRN [id number] PCP: Cleatis Polka., MD  [facility] Providers Cardiologist:  Verne Carrow, MD    History of Present Illness:   Discussed the use of AI scribe software for clinical note transcription with the patient, who gave verbal consent to proceed.  History of Present Illness   The patient, a 77 year old individual with a complex medical history including cold agglutinin hemolytic anemia, arthritis, prior DVT, anxiety and depression, vertigo, hypertension, thoracic aortic aneurysm, hypothyroidism, and coronary artery disease, was initially consulted in 2022 due to an abnormal coronary calcium score. Subsequent nuclear stress test and echocardiogram revealed no ischemia and normal left ventricular systolic function, respectively. The patient also has aortic insufficiency, which has remained stable with mild to moderate regurgitation as per the most recent echocardiogram in September 2024.  Over the past couple of months, the patient has noticed increased fatigue and decreased stamina, particularly when climbing stairs at a brisk pace. This is a new symptom and not typical for the patient. The patient also reports more frequent headaches, described as dull and constant, over the past couple of months. However, there have been no episodes of chest pain or palpitations. The patient has been monitoring her blood pressure intermittently at home, reports recently elevated, with readings as high as 180/80 and 150/80. BP also 173/70 mmHg at her plastic surgeon on 10/04/23.  The patient has been on hydrochlorothiazide for blood pressure management, though says this was initially prescribed as a diuretic. At one point was taking Losartan-hydrochlorothiazide combo but stopped Losartan component. Patient says this was due to dizziness/lightheadedness. The patient also takes rosuvastatin 20mg  for  cholesterol management, with the last LDL reading at 61 in April 2023.   The patient has a history of thoracic aortic aneurysm, which has remained stable as per recent CT angiography. The patient also has a history of coronary artery disease, with significant coronary calcium noted on non-contrast chest CT. Despite this, the patient has not reported any symptoms of coronary artery disease, and a nuclear stress test in 2022 showed no ischemia.  In summary, the patient presents with increased fatigue, decreased stamina, and frequent headaches, with consistently elevated blood pressure readings over the past 1-2 months. The patient's complex medical history, including hypertension, coronary artery disease, and thoracic aortic aneurysm, necessitates careful management and monitoring of these new symptoms.     ROS summary: Positive for fatigue, headaches, decreased stamina.  Patient denies chest pain, shortness of breath, lower extremity edema, palpitations, melena, hematuria, hemoptysis, diaphoresis, weakness, presyncope, syncope, orthopnea, and PND.   Studies Reviewed:    EKG:   EKG Interpretation Date/Time:  Monday October 09 2023 11:02:18 EDT Ventricular Rate:  68 PR Interval:  184 QRS Duration:  86 QT Interval:  408 QTC Calculation: 433 R Axis:   -48  Text Interpretation: Normal sinus rhythm Possible Left atrial enlargement Left axis deviation Septal infarct , age undetermined When compared with ECG of 07-Oct-2019 15:34, No acute changes PREVIOUS ECG IS PRESENT Confirmed by Perlie Gold [phone]) on 10/09/2023 11:11:49 AM    05/30/23 CTA Chest  EXAM: CT ANGIOGRAPHY CHEST WITH CONTRAST   TECHNIQUE: Multidetector CT imaging of the chest was performed using the standard protocol during bolus administration of intravenous contrast. Multiplanar CT image reconstructions and MIPs were obtained to evaluate the vascular anatomy.   RADIATION DOSE REDUCTION: This exam was performed according  to the departmental dose-optimization program which  includes automated exposure control, adjustment of the mA and/or kV according to patient size and/or use of iterative reconstruction technique.   CONTRAST:  75mL ISOVUE-370 IOPAMIDOL (ISOVUE-370) INJECTION 76%   COMPARISON:  CTA chest 05/10/2022   FINDINGS: Cardiovascular: There is aortic atherosclerosis, mild patchy calcific plaques primarily in the arch and descending segments, with calcification at the left subclavian artery origin with otherwise clear great vessels.   There is stable 4.2 cm fusiform aneurysmal dilatation of the ascending segment.   The greatest aortic diameter is measured AP on sagittal reconstruction series 6, image 91.   Axial images overestimate the proximal aortic diameter for the most part because they mostly cut the aorta at an obliquity.   The pulmonary arteries and veins are normal in caliber. The cardiac size is normal. There is no pericardial effusion.   There is three-vessel patchy coronary artery calcification. No arterial embolus is seen.   Mediastinum/Nodes: No enlarged mediastinal, hilar, or axillary lymph nodes. Thyroid gland, trachea, and esophagus demonstrate no significant findings.   Lungs/Pleura: there is mild symmetric pleural-parenchymal scarring in the apices. There are a few linear scar-like opacities in the lower lung fields.   There are thin walled air cysts in the right middle and lower lobe bases, seen previously.   The lungs are clear of infiltrates and nodules. There is no pleural effusion, thickening or pneumothorax.   Upper Abdomen: No acute abnormality. Chronic postcholecystectomy extrahepatic biliary prominence with common bile duct 8 mm. The liver is mildly steatotic.   Musculoskeletal: There is new demonstration of subacute, mostly healed posterior fractures of the left eighth and ninth ribs, lateral left seventh rib.   No acute or other significant osseous  abnormality. Advanced disc collapse is again noted at L1-2 with discogenic reactive endplate sclerosis particularly towards the right.   Review of the MIP images confirms the above findings.   IMPRESSION: 1. Stable 4.2 cm fusiform aneurysmal dilatation of the ascending aorta. 2. Aortic and coronary artery atherosclerosis. 3. No acute chest CT or CTA findings. 4. Mild hepatic steatosis. 5. Subacute, mostly healed fractures of the left seventh, eighth and ninth ribs, not seen previously.   Aortic Atherosclerosis (ICD10-I70.0).    Risk Assessment/Calculations:     HYPERTENSION CONTROL Vitals:   10/09/23 1056 10/09/23 1144  BP: (!) 150/80 (!) 162/82    The patient's blood pressure is elevated above target today.  In order to address the patient's elevated BP: A new medication was prescribed today.           Physical Exam:   VS:  BP (!) 162/82   Pulse 68   Ht 5\' 5"  (1.651 m)   Wt 134 lb (60.8 kg)   SpO2 96%   BMI 22.30 kg/m    Wt Readings from Last 3 Encounters:  10/09/23 134 lb (60.8 kg)  03/13/23 134 lb (60.8 kg)  10/26/22 133 lb 9.6 oz (60.6 kg)     Physical Exam Vitals reviewed.  Constitutional:      Appearance: Normal appearance.  HENT:     Head: Normocephalic.     Nose: Nose normal.  Eyes:     Pupils: Pupils are equal, round, and reactive to light.  Cardiovascular:     Rate and Rhythm: Normal rate and regular rhythm.     Pulses: Normal pulses.     Heart sounds: Normal heart sounds. No murmur heard.    No friction rub. No gallop.  Pulmonary:     Effort: Pulmonary effort is normal.  Breath sounds: Normal breath sounds.  Abdominal:     General: Abdomen is flat.  Musculoskeletal:     Right lower leg: No edema.     Left lower leg: No edema.  Skin:    General: Skin is warm and dry.     Capillary Refill: Capillary refill takes less than 2 seconds.  Neurological:     General: No focal deficit present.     Mental Status: She is alert and oriented  to person, place, and time.  Psychiatric:        Mood and Affect: Mood normal.        Behavior: Behavior normal.        Thought Content: Thought content normal.        Judgment: Judgment normal.     ASSESSMENT AND PLAN:     Assessment and Plan    Hypertension Elevated blood pressure readings (150/80, 162/82) despite current treatment with Hydrochlorothiazide. Recent symptoms of fatigue and headaches, possibly related to elevated blood pressure. -Start Valsartan 80mg  daily. BMP in 1 week. -Check blood pressure daily at home and bring log to next appointment. -Continue Hydrochlorothiazide for now, with potential to discontinue depending on response to Valsartan.  Coronary Artery Disease Elevated coronary calcium score on CT, but reassuring nuclear stress test in 2022 and normal left ventricular ejection fraction on recent echocardiogram. ECG today without ischemic changes noted. -Continue Rosuvastatin 20mg  daily. -Aspirin 81mg  daily  -Check fasting lipid panel in 1 week.  Thoracic Aortic Aneurysm Stable on Juen 2024 chest CTA imaging. Importance of blood pressure control emphasized due to this condition. -Continue current management and monitoring with annual imaging (Dr. Clelia Croft has been ordering historically).  Shortness of Breath New symptom, possibly related to allergies or elevated blood pressure. No associated chest pain or other cardiac symptoms. -Monitor symptoms, particularly as allergy season ends and with new BP regimen.  General Health Maintenance -Recommend following a Mediterranean diet for cholesterol management. -Plan to check kidney function and potassium levels in 1 week due to initiation of Valsartan. -Follow-up appointment in 1 month to assess response to new treatment and review blood pressure log.             Signed, Perlie Gold, PA-C

## 2023-10-09 ENCOUNTER — Ambulatory Visit: Payer: Medicare Other | Attending: Cardiology | Admitting: Cardiology

## 2023-10-09 ENCOUNTER — Other Ambulatory Visit: Payer: Self-pay | Admitting: *Deleted

## 2023-10-09 ENCOUNTER — Ambulatory Visit: Payer: Medicare Other | Admitting: Cardiology

## 2023-10-09 ENCOUNTER — Inpatient Hospital Stay: Payer: Medicare Other | Admitting: Hematology & Oncology

## 2023-10-09 ENCOUNTER — Inpatient Hospital Stay: Payer: Medicare Other | Attending: Hematology & Oncology

## 2023-10-09 ENCOUNTER — Encounter: Payer: Self-pay | Admitting: Cardiology

## 2023-10-09 VITALS — BP 179/65 | HR 66 | Temp 97.8°F | Resp 16 | Ht 65.0 in | Wt 134.0 lb

## 2023-10-09 VITALS — BP 162/82 | HR 68 | Ht 65.0 in | Wt 134.0 lb

## 2023-10-09 DIAGNOSIS — I351 Nonrheumatic aortic (valve) insufficiency: Secondary | ICD-10-CM

## 2023-10-09 DIAGNOSIS — I2510 Atherosclerotic heart disease of native coronary artery without angina pectoris: Secondary | ICD-10-CM

## 2023-10-09 DIAGNOSIS — I7121 Aneurysm of the ascending aorta, without rupture: Secondary | ICD-10-CM

## 2023-10-09 DIAGNOSIS — Z862 Personal history of diseases of the blood and blood-forming organs and certain disorders involving the immune mechanism: Secondary | ICD-10-CM | POA: Diagnosis not present

## 2023-10-09 DIAGNOSIS — D5912 Cold autoimmune hemolytic anemia: Secondary | ICD-10-CM | POA: Diagnosis not present

## 2023-10-09 DIAGNOSIS — I10 Essential (primary) hypertension: Secondary | ICD-10-CM

## 2023-10-09 LAB — CBC WITH DIFFERENTIAL (CANCER CENTER ONLY)
Abs Immature Granulocytes: 0.09 10*3/uL — ABNORMAL HIGH (ref 0.00–0.07)
Basophils Absolute: 0 10*3/uL (ref 0.0–0.1)
Basophils Relative: 1 %
Eosinophils Absolute: 0.1 10*3/uL (ref 0.0–0.5)
Eosinophils Relative: 2 %
HCT: 38.8 % (ref 36.0–46.0)
Hemoglobin: 13.2 g/dL (ref 12.0–15.0)
Immature Granulocytes: 2 %
Lymphocytes Relative: 13 %
Lymphs Abs: 0.7 10*3/uL (ref 0.7–4.0)
MCH: 31.8 pg (ref 26.0–34.0)
MCHC: 34 g/dL (ref 30.0–36.0)
MCV: 93.5 fL (ref 80.0–100.0)
Monocytes Absolute: 0.3 10*3/uL (ref 0.1–1.0)
Monocytes Relative: 6 %
Neutro Abs: 4.1 10*3/uL (ref 1.7–7.7)
Neutrophils Relative %: 76 %
Platelet Count: 191 10*3/uL (ref 150–400)
RBC: 4.15 MIL/uL (ref 3.87–5.11)
RDW: 13.3 % (ref 11.5–15.5)
WBC Count: 5.4 10*3/uL (ref 4.0–10.5)
nRBC: 0 % (ref 0.0–0.2)

## 2023-10-09 LAB — CMP (CANCER CENTER ONLY)
ALT: 18 U/L (ref 0–44)
AST: 20 U/L (ref 15–41)
Albumin: 5.3 g/dL — ABNORMAL HIGH (ref 3.5–5.0)
Alkaline Phosphatase: 64 U/L (ref 38–126)
Anion gap: 9 (ref 5–15)
BUN: 21 mg/dL (ref 8–23)
CO2: 31 mmol/L (ref 22–32)
Calcium: 9.9 mg/dL (ref 8.9–10.3)
Chloride: 96 mmol/L — ABNORMAL LOW (ref 98–111)
Creatinine: 0.79 mg/dL (ref 0.44–1.00)
GFR, Estimated: >60 mL/min (ref >60)
Glucose, Bld: 102 mg/dL — ABNORMAL HIGH (ref 70–99)
Potassium: 3.6 mmol/L (ref 3.5–5.1)
Sodium: 136 mmol/L (ref 135–145)
Total Bilirubin: 2.2 mg/dL — ABNORMAL HIGH (ref 0.3–1.2)
Total Protein: 7.2 g/dL (ref 6.5–8.1)

## 2023-10-09 LAB — RETICULOCYTES
Immature Retic Fract: 12.9 % (ref 2.3–15.9)
RBC.: 4.19 MIL/uL (ref 3.87–5.11)
Retic Count, Absolute: 109.8 10*3/uL (ref 19.0–186.0)
Retic Ct Pct: 2.6 % (ref 0.4–3.1)

## 2023-10-09 LAB — FERRITIN: Ferritin: 334 ng/mL — ABNORMAL HIGH (ref 11–307)

## 2023-10-09 LAB — LACTATE DEHYDROGENASE: LDH: 213 U/L — ABNORMAL HIGH (ref 98–192)

## 2023-10-09 MED ORDER — VALSARTAN 80 MG PO TABS: 80.0000 mg | ORAL_TABLET | Freq: Every day | ORAL | 3 refills | Status: DC

## 2023-10-09 NOTE — Progress Notes (Signed)
Hematology and Oncology Follow Up Visit  TAEYLOR [name] [id number] [birth year] 77 y.o. 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 daily      Interim History:  Christine [name] is back for follow-up.  We saw her 6 months ago.  Since then, she has been doing pretty well.  As always, she is working at [employer].  It is no surprise that this is where my wife likes to shop.  Overall, everything is going quite well.  She is doing well on the folic acid.  She really does not have much in the way of fatigue or weakness.  She did have a bout of COVID earlier in the year.  She has gotten over this.  Her last cold agglutinin titer back in April was 1:1024.  She has had no change in bowel or bladder habits.  She has had no leg swelling.  There is been no rashes.  She has had no cough or shortness of breath.  There has been no nausea or vomiting.  Currently, I would have to send the her performance status and probably ECOG 0.  Medications:  Current Outpatient Medications:    acetaminophen (TYLENOL) 500 MG tablet, Take 500-1,000 mg by mouth every 6 (six) hours as needed for mild pain or headache., Disp: , Rfl:    amoxicillin (AMOXIL) 500 MG tablet, Take 500 mg by mouth. Before dental work, Disp: , Rfl:    buPROPion (WELLBUTRIN XL) 300 MG 24 hr tablet, Take 300 mg by mouth every morning., Disp: , Rfl:    Cholecalciferol (VITAMIN D3 MAXIMUM STRENGTH) 125 MCG (5000 UT) capsule, Take 5,000 Units by mouth daily., Disp: , Rfl:    EPIPEN 2-PAK 0.3 MG/0.3ML SOAJ injection, Inject 0.3 mg into the muscle once as needed for anaphylaxis., Disp: , Rfl: 6   finasteride (PROSCAR) 5 MG tablet, Take 2.5 mg by mouth daily., Disp: , Rfl:    folic acid (FOLVITE) 1 MG tablet, TAKE 2 TABLETS BY MOUTH EVERY DAY, Disp: 360 tablet, Rfl: 0   hydrochlorothiazide  (MICROZIDE) 12.5 MG capsule, Take 12.5 mg by mouth daily., Disp: , Rfl: 11   meclizine (ANTIVERT) 25 MG tablet, Take 25 mg by mouth as needed., Disp: , Rfl:    montelukast (SINGULAIR) 10 MG tablet, Take 10 mg by mouth as needed (allergies)., Disp: , Rfl:    rosuvastatin (CRESTOR) 20 MG tablet, Take 20 mg by mouth daily., Disp: , Rfl:    SYNTHROID 88 MCG tablet, Take 88 mcg by mouth daily before breakfast. , Disp: , Rfl:    valsartan (DIOVAN) 80 MG tablet, Take 1 tablet (80 mg total) by mouth daily., Disp: 90 tablet, Rfl: 3   zolpidem (AMBIEN) 10 MG tablet, Take 1 tablet (10 mg total) by mouth at bedtime., Disp: 30 tablet, Rfl: 3 No current facility-administered medications for this visit.  Facility-Administered Medications Ordered in Other Visits:    regadenoson (LEXISCAN) injection SOLN 0.4 mg, 0.4 mg, Intravenous, Once, Chilton Si, MD  Allergies:  Allergies  Allergen Reactions   Bee Venom Anaphylaxis and Shortness Of Breath   Losartan Potassium Other (See Comments)   Other Other (See Comments)   Varenicline Other (See Comments)    Past Medical History, Surgical history, Social history, and Family History were reviewed and updated.  Review of Systems: Review of Systems  Constitutional:  Positive for  fatigue.  HENT:  Negative.    Eyes: Negative.   Respiratory: Negative.    Cardiovascular: Negative.   Gastrointestinal: Negative.   Endocrine: Negative.   Genitourinary: Negative.    Musculoskeletal: Negative.   Skin: Negative.   Neurological: Negative.   Hematological: Negative.   Psychiatric/Behavioral: Negative.      Physical Exam:  height is 5\' 5"  (1.651 m) and weight is 134 lb (60.8 kg). Her oral temperature is 97.8 F (36.6 C). Her blood pressure is 179/65 (abnormal) and her pulse is 66. Her respiration is 16 and oxygen saturation is 100%.   Wt Readings from Last 3 Encounters:  10/09/23 134 lb (60.8 kg)  10/09/23 134 lb (60.8 kg)  03/13/23 134 lb (60.8 kg)     Physical Exam Vitals reviewed.  HENT:     Head: Normocephalic and atraumatic.  Eyes:     Pupils: Pupils are equal, round, and reactive to light.  Cardiovascular:     Rate and Rhythm: Normal rate and regular rhythm.     Heart sounds: Normal heart sounds.  Pulmonary:     Effort: Pulmonary effort is normal.     Breath sounds: Normal breath sounds.  Abdominal:     General: Bowel sounds are normal.     Palpations: Abdomen is soft.  Musculoskeletal:        General: No tenderness or deformity. Normal range of motion.     Cervical back: Normal range of motion.  Lymphadenopathy:     Cervical: No cervical adenopathy.  Skin:    General: Skin is warm and dry.     Findings: No erythema or rash.  Neurological:     Mental Status: She is alert and oriented to person, place, and time.  Psychiatric:        Behavior: Behavior normal.        Thought Content: Thought content normal.        Judgment: Judgment normal.     Lab Results  Component Value Date   WBC 5.4 10/09/2023   HGB 13.2 10/09/2023   HCT 38.8 10/09/2023   MCV 93.5 10/09/2023   PLT 191 10/09/2023     Chemistry      Component Value Date/Time   NA 135 03/13/2023 1316   NA 133 02/13/2014 1108   K 3.2 (L) 03/13/2023 1316   K 4.9 (H) 02/13/2014 1108   CL 99 03/13/2023 1316   CL 97 (L) 02/13/2014 1108   CO2 26 03/13/2023 1316   CO2 32 02/13/2014 1108   BUN 18 03/13/2023 1316   BUN 13 02/13/2014 1108   CREATININE 0.75 03/13/2023 1316   CREATININE 0.4 (L) 02/13/2014 1108      Component Value Date/Time   CALCIUM 9.2 03/13/2023 1316   CALCIUM 9.3 02/13/2014 1108   ALKPHOS 77 03/13/2023 1316   ALKPHOS 63 02/13/2014 1108   AST 19 03/13/2023 1316   ALT 19 03/13/2023 1316   ALT 15 02/13/2014 1108   BILITOT 1.5 (H) 03/13/2023 1316       Impression and Plan: [name] is a 77 year old white female.  She is a history of cold agglutinin disease.  We treated her with Rituxan back in April 2015.  She tolerated this  quite well.  She then had a relapse.  We went ahead and treated her with Rituxan/bendamustine.  She had 4 cycles of treatment thus completed in February 2021.  Everything is doing quite well right now.  Her hemoglobin is holding steady.  She is does  not have a elevated reticulocyte count.  Will be interesting to see what the cold agglutinin titer is.  I think that as long she is on folic acid, she should be doing pretty well.  We will plan to get her back in 6 more months.  I would like to hope that the summer months will be good for her blood.   Josph Macho, MD 10/28/20242:12 PM

## 2023-10-09 NOTE — Patient Instructions (Signed)
Medication Instructions:  Your physician has recommended you make the following change in your medication:   START Valsartan 80 taking 1 daily  *If you need a refill on your cardiac medications before your next appointment, please call your pharmacy*   Lab Work: MONDAY, 10/16/23, COME ANYTIME AFTER 7:30 AM, FASTING, FOR:  LIPID & BMET  If you have labs (blood work) drawn today and your tests are completely normal, you will receive your results only by: MyChart Message (if you have MyChart) OR A paper copy in the mail If you have any lab test that is abnormal or we need to change your treatment, we will call you to review the results.   Testing/Procedures: None ordered   Follow-Up: At [facility], you and your health needs are our priority.  As part of our continuing mission to provide you with exceptional heart care, we have created designated Provider Care Teams.  These Care Teams include your primary Cardiologist (physician) and Advanced Practice Providers (APPs -  Physician Assistants and Nurse Practitioners) who all work together to provide you with the care you need, when you need it.  We recommend signing up for the patient portal called "MyChart".  Sign up information is provided on this After Visit Summary.  MyChart is used to connect with patients for Virtual Visits (Telemedicine).  Patients are able to view lab/test results, encounter notes, upcoming appointments, etc.  Non-urgent messages can be sent to your provider as well.   To learn more about what you can do with MyChart, go to [email or web address].    Your next appointment:   1 month(s)  Provider:   Verne Carrow, MD  or Perlie Gold,    Other Instructions

## 2023-10-10 LAB — IRON AND IRON BINDING CAPACITY (CC-WL,HP ONLY)
Iron: 171 ug/dL — ABNORMAL HIGH (ref 28–170)
Saturation Ratios: 53 % — ABNORMAL HIGH (ref 10.4–31.8)
TIBC: 323 ug/dL (ref 250–450)
UIBC: 152 ug/dL (ref 148–442)

## 2023-10-10 LAB — COLD AGGLUTININ TITER: Cold Agglutinin Titer: 1:2048 {titer} — ABNORMAL HIGH

## 2023-10-17 LAB — LIPID PANEL
Chol/HDL Ratio: 1.7 ratio (ref 0.0–4.4)
Cholesterol, Total: 167 mg/dL (ref 100–199)
HDL: 101 mg/dL (ref >39)
LDL Chol Calc (NIH): 57 mg/dL (ref 0–99)
Triglycerides: 42 mg/dL (ref 0–149)
VLDL Cholesterol Cal: 9 mg/dL (ref 5–40)

## 2023-10-17 LAB — BASIC METABOLIC PANEL
BUN/Creatinine Ratio: 27 (ref 12–28)
BUN: 21 mg/dL (ref 8–27)
CO2: 25 mmol/L (ref 20–29)
Calcium: 9.7 mg/dL (ref 8.7–10.3)
Chloride: 97 mmol/L (ref 96–106)
Creatinine, Ser: 0.78 mg/dL (ref 0.57–1.00)
Glucose: 96 mg/dL (ref 70–99)
Potassium: 4.6 mmol/L (ref 3.5–5.2)
Sodium: 135 mmol/L (ref 134–144)
eGFR: 78 mL/min/{1.73_m2} (ref >59)

## 2023-10-20 LAB — BASIC METABOLIC PANEL

## 2023-10-20 LAB — LIPID PANEL

## 2023-11-06 ENCOUNTER — Encounter: Payer: Self-pay | Admitting: Cardiology

## 2023-11-06 ENCOUNTER — Ambulatory Visit: Payer: Medicare Other | Attending: Cardiology | Admitting: Cardiology

## 2023-11-06 VITALS — BP 121/60 | HR 70 | Ht 65.0 in | Wt 134.6 lb

## 2023-11-06 DIAGNOSIS — I7121 Aneurysm of the ascending aorta, without rupture: Secondary | ICD-10-CM

## 2023-11-06 DIAGNOSIS — I2510 Atherosclerotic heart disease of native coronary artery without angina pectoris: Secondary | ICD-10-CM | POA: Diagnosis not present

## 2023-11-06 DIAGNOSIS — I10 Essential (primary) hypertension: Secondary | ICD-10-CM

## 2023-11-06 NOTE — Patient Instructions (Signed)
Medication Instructions:  Your physician recommends that you continue on your current medications as directed. Please refer to the Current Medication list given to you today. *If you need a refill on your cardiac medications before your next appointment, please call your pharmacy*   Lab Work: None Ordered   Testing/Procedures: None ordered   Follow-Up: At [facility], you and your health needs are our priority.  As part of our continuing mission to provide you with exceptional heart care, we have created designated Provider Care Teams.  These Care Teams include your primary Cardiologist (physician) and Advanced Practice Providers (APPs -  Physician Assistants and Nurse Practitioners) who all work together to provide you with the care you need, when you need it.  We recommend signing up for the patient portal called "MyChart".  Sign up information is provided on this After Visit Summary.  MyChart is used to connect with patients for Virtual Visits (Telemedicine).  Patients are able to view lab/test results, encounter notes, upcoming appointments, etc.  Non-urgent messages can be sent to your provider as well.   To learn more about what you can do with MyChart, go to [email or web address].    Your next appointment:   6 month(s)  Provider:   Verne Carrow, MD     Other Instructions

## 2023-11-06 NOTE — Progress Notes (Signed)
Cardiology Office Note:   Date:  11/06/2023  ID:  AMBELLINA TITO, DOB 06/20/1946, MRN [id number] PCP: Cleatis Polka., MD  [facility] Providers Cardiologist:  Verne Carrow, MD    History of Present Illness:   Discussed the use of AI scribe software for clinical note transcription with the patient, who gave verbal consent to proceed.  History of Present Illness       History of Present Illness   The patient, a 77 year old individual with a complex medical history including cold agglutinin hemolytic anemia, arthritis, prior DVT, anxiety and depression, vertigo, hypertension, thoracic aortic aneurysm, hypothyroidism, and coronary artery disease, was initially consulted in 2022 due to an abnormal coronary calcium score. Subsequent nuclear stress test and echocardiogram revealed no ischemia and normal left ventricular systolic function, respectively. The patient also has aortic insufficiency, which has remained stable with mild to moderate regurgitation as per the most recent echocardiogram in September 2024.  At her last visit with me, the patient reported increased fatigue, decreased stamina, and frequent headaches, with consistently elevated blood pressure readings over the past 1-2 months. We started Valsartan 80mg . Per home BP log, blood pressures have been 130s-150s systolic. Patient typically checks in the morning after taking her BP meds, but also after her usual 1.5 cups of coffee. She continues with non-specific fatigue but acknowledge that she is still more active than many of her peers. She continues to work and maintain a busy lifestyle.   The patient is currently on a regimen of hydrochlorothiazide, valsartan, and Crestor. She expresses a desire to reduce the number of medications they are taking, but understand the importance of their current regimen.  The patient also has a history of a thoracic aortic aneurysm, which is being monitored annually. She reports no  shortness of breath or chest pain recently, and continues to be active as noted above. Her most recent lipid panel showed an LDL of 57. She expresses satisfaction with these results.  The patient's spouse recently had an emergency room visit due to atrial fibrillation. The patient expresses concern about the organization and communication during this visit, and wants to ensure that the records from this visit were sent to her spouse's doctor.   Today patient denies chest pain, shortness of breath, lower extremity edema, fatigue, palpitations, melena, hematuria, hemoptysis, diaphoresis, weakness, presyncope, syncope, orthopnea, and PND.   Studies Reviewed:    EKG:        Sep 06, 2023 TTE  IMPRESSIONS     1. Left ventricular ejection fraction, by estimation, is 60 to 65%. The  left ventricle has normal function. The left ventricle has no regional  wall motion abnormalities. Left ventricular diastolic parameters are  consistent with Grade I diastolic  dysfunction (impaired relaxation).   2. Right ventricular systolic function is normal. The right ventricular  size is normal.   3. The mitral valve is normal in structure. No evidence of mitral valve  regurgitation. No evidence of mitral stenosis.   4. Tricuspid valve regurgitation is moderate.   5. The aortic valve is tricuspid. There is mild calcification of the  aortic valve. There is mild thickening of the aortic valve. Aortic valve  regurgitation is mild to moderate. No aortic stenosis is present.   6. There is mild dilatation of the ascending aorta, measuring 44 mm.   7. The inferior vena cava is normal in size with greater than 50%  respiratory variability, suggesting right atrial pressure of 3 mmHg.   FINDINGS  Left Ventricle: Left ventricular ejection fraction, by estimation, is 60  to 65%. The left ventricle has normal function. The left ventricle has no  regional wall motion abnormalities. The left ventricular internal cavity   size was normal in size. There is   no left ventricular hypertrophy. Left ventricular diastolic parameters  are consistent with Grade I diastolic dysfunction (impaired relaxation).   Right Ventricle: The right ventricular size is normal. No increase in  right ventricular wall thickness. Right ventricular systolic function is  normal.   Left Atrium: Left atrial size was normal in size.   Right Atrium: Right atrial size was normal in size.   Pericardium: There is no evidence of pericardial effusion.   Mitral Valve: The mitral valve is normal in structure. No evidence of  mitral valve regurgitation. No evidence of mitral valve stenosis.   Tricuspid Valve: The tricuspid valve is normal in structure. Tricuspid  valve regurgitation is moderate . No evidence of tricuspid stenosis.   Aortic Valve: The aortic valve is tricuspid. There is mild calcification  of the aortic valve. There is mild thickening of the aortic valve. Aortic  valve regurgitation is mild to moderate. Aortic regurgitation PHT measures  603 msec. No aortic stenosis is  present.   Pulmonic Valve: The pulmonic valve was normal in structure. Pulmonic valve  regurgitation is mild. No evidence of pulmonic stenosis.   Aorta: The aortic root is normal in size and structure. There is mild  dilatation of the ascending aorta, measuring 44 mm.   Venous: The inferior vena cava is normal in size with greater than 50%  respiratory variability, suggesting right atrial pressure of 3 mmHg.   IAS/Shunts: No atrial level shunt detected by color flow Doppler.   Risk Assessment/Calculations:              Physical Exam:   VS:  BP 121/60   Pulse 70   Ht 5\' 5"  (1.651 m)   Wt 134 lb 9.6 oz (61.1 kg)   SpO2 96%   BMI 22.40 kg/m    Wt Readings from Last 3 Encounters:  11/06/23 134 lb 9.6 oz (61.1 kg)  10/09/23 134 lb (60.8 kg)  10/09/23 134 lb (60.8 kg)     Physical Exam Vitals reviewed.  Constitutional:      Appearance:  Normal appearance.  HENT:     Head: Normocephalic.     Nose: Nose normal.  Eyes:     Pupils: Pupils are equal, round, and reactive to light.  Cardiovascular:     Rate and Rhythm: Normal rate and regular rhythm.     Pulses: Normal pulses.     Heart sounds: Normal heart sounds. No murmur heard.    No friction rub. No gallop.  Pulmonary:     Effort: Pulmonary effort is normal.     Breath sounds: Normal breath sounds.  Abdominal:     General: Abdomen is flat.  Musculoskeletal:     Right lower leg: No edema.     Left lower leg: No edema.  Skin:    General: Skin is warm and dry.     Capillary Refill: Capillary refill takes less than 2 seconds.  Neurological:     General: No focal deficit present.     Mental Status: She is alert and oriented to person, place, and time.  Psychiatric:        Mood and Affect: Mood normal.        Behavior: Behavior normal.  Thought Content: Thought content normal.        Judgment: Judgment normal.     Physical Exam   VITALS: BP- 134/64 CARDIOVASCULAR: Normal heart sounds.       ASSESSMENT AND PLAN:     Assessment and Plan    Thoracic Aortic Aneurysm Thoracic aortic aneurysm stable on recent imaging. Blood pressure control is crucial to prevent progression.  - Continue annual monitoring with Dr. Clelia Croft - Maintain strict blood pressure control  Hypertension Hypertension well-controlled today with valsartan 80 mg and hydrochlorothiazide 12.5 mg. Discrepancy between home and office readings, possibly due to timing of home checks with caffeine intake. Office readings: 121/60 and 134/64. Home readings higher, possibly influenced by morning caffeine. Discussed importance of blood pressure control to prevent aneurysm progression. - Continue valsartan 80 mg daily - Continue hydrochlorothiazide 12.5 mg daily - Check blood pressure in the evenings for a couple of weeks starting next week (after Thanksgiving) - Message in 2-3 weeks to review  evening blood pressure readings - If home readings remain elevated, schedule nurse visit to compare home cuff with office cuff for calibration  Coronary Artery Disease  Elevated coronary calcium score on CT, but reassuring nuclear stress test in 2022 and normal left ventricular ejection fraction on recent echocardiogram. Lipid panel excellent with LDL at 57.  - Continue rosuvastatin 20 mg daily - Aspirin 81mg  daily   General Health Maintenance  - No new health maintenance actions required at this time  Follow-up - Schedule follow-up visit with Dr. Clifton James in 6 months - Message in 2-3 weeks to review evening blood pressure readings.                 Signed, Perlie Gold, PA-C

## 2023-11-27 ENCOUNTER — Telehealth: Payer: Self-pay

## 2023-11-27 NOTE — Telephone Encounter (Signed)
-----   Message from Perlie Gold sent at 11/27/2023 12:40 PM EST ----- Regarding: BP log Please call patient to inquire about recent blood pressure readings. Thanks!  Perlie Gold, PA-C

## 2023-11-27 NOTE — Telephone Encounter (Signed)
I called and spoke with the pt and she reports that she has not been checking her BP... she has been feeling well... she will keep a log over the next several days and call us with her BP readings.

## 2024-02-07 ENCOUNTER — Other Ambulatory Visit: Payer: Self-pay | Admitting: Hematology & Oncology

## 2024-02-07 DIAGNOSIS — D500 Iron deficiency anemia secondary to blood loss (chronic): Secondary | ICD-10-CM

## 2024-04-08 ENCOUNTER — Encounter: Payer: Self-pay | Admitting: Hematology & Oncology

## 2024-04-08 ENCOUNTER — Inpatient Hospital Stay: Payer: Medicare Other | Attending: Hematology & Oncology

## 2024-04-08 ENCOUNTER — Inpatient Hospital Stay: Payer: Medicare Other | Admitting: Hematology & Oncology

## 2024-04-08 VITALS — BP 169/74 | HR 73 | Temp 98.7°F | Resp 20 | Ht 65.0 in | Wt 135.8 lb

## 2024-04-08 DIAGNOSIS — D5912 Cold autoimmune hemolytic anemia: Secondary | ICD-10-CM | POA: Insufficient documentation

## 2024-04-08 DIAGNOSIS — Z79899 Other long term (current) drug therapy: Secondary | ICD-10-CM | POA: Insufficient documentation

## 2024-04-08 LAB — CMP (CANCER CENTER ONLY)
ALT: 20 U/L (ref 0–44)
AST: 22 U/L (ref 15–41)
Albumin: 4.5 g/dL (ref 3.5–5.0)
Alkaline Phosphatase: 65 U/L (ref 38–126)
Anion gap: 9 (ref 5–15)
BUN: 21 mg/dL (ref 8–23)
CO2: 27 mmol/L (ref 22–32)
Calcium: 9.3 mg/dL (ref 8.9–10.3)
Chloride: 99 mmol/L (ref 98–111)
Creatinine: 0.83 mg/dL (ref 0.44–1.00)
GFR, Estimated: >60 mL/min (ref >60)
Glucose, Bld: 113 mg/dL — ABNORMAL HIGH (ref 70–99)
Potassium: 4 mmol/L (ref 3.5–5.1)
Sodium: 135 mmol/L (ref 135–145)
Total Bilirubin: 1.7 mg/dL — ABNORMAL HIGH (ref 0.0–1.2)
Total Protein: 7 g/dL (ref 6.5–8.1)

## 2024-04-08 LAB — CBC WITH DIFFERENTIAL (CANCER CENTER ONLY)
Abs Immature Granulocytes: 0.06 10*3/uL (ref 0.00–0.07)
Basophils Absolute: 0.1 10*3/uL (ref 0.0–0.1)
Basophils Relative: 1 %
Eosinophils Absolute: 0.1 10*3/uL (ref 0.0–0.5)
Eosinophils Relative: 2 %
HCT: 36.4 % (ref 36.0–46.0)
Hemoglobin: 12.7 g/dL (ref 12.0–15.0)
Immature Granulocytes: 1 %
Lymphocytes Relative: 16 %
Lymphs Abs: 0.7 10*3/uL (ref 0.7–4.0)
MCH: 32.2 pg (ref 26.0–34.0)
MCHC: 34.9 g/dL (ref 30.0–36.0)
MCV: 92.2 fL (ref 80.0–100.0)
Monocytes Absolute: 0.5 10*3/uL (ref 0.1–1.0)
Monocytes Relative: 10 %
Neutro Abs: 3.2 10*3/uL (ref 1.7–7.7)
Neutrophils Relative %: 70 %
Platelet Count: 227 10*3/uL (ref 150–400)
RBC: 3.95 MIL/uL (ref 3.87–5.11)
RDW: 13.5 % (ref 11.5–15.5)
WBC Count: 4.6 10*3/uL (ref 4.0–10.5)
nRBC: 0 % (ref 0.0–0.2)

## 2024-04-08 LAB — LACTATE DEHYDROGENASE: LDH: 214 U/L — ABNORMAL HIGH (ref 98–192)

## 2024-04-08 LAB — RETICULOCYTES
Immature Retic Fract: 12.8 % (ref 2.3–15.9)
RBC.: 3.91 MIL/uL (ref 3.87–5.11)
Retic Count, Absolute: 102.1 10*3/uL (ref 19.0–186.0)
Retic Ct Pct: 2.6 % (ref 0.4–3.1)

## 2024-04-08 LAB — SAVE SMEAR(SSMR), FOR PROVIDER SLIDE REVIEW

## 2024-04-08 NOTE — Progress Notes (Signed)
 BP 169/74, has not been taking Valsartan , will continue to monitor at home and notify cardiology if it remains over 140/90.

## 2024-04-08 NOTE — Progress Notes (Signed)
 Hematology and Oncology Follow Up Visit  Christine [name] [id number] January 15, 1946 78 y.o. 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 daily      Interim History:  Christine [name] is back for follow-up.  We saw Christine 6 months ago.  Christine lot has gone on.  Christine [name] is not doing all that well.  He was just in the hospital.  He has cardiac issues.  He had Christine cardiac stent placed.  Christine [name] died Christine week ago.  She will be going to the funeral Wednesday.  She is still working.  She enjoys work.  She is still taking Christine folic acid .  Christine last cold agglutinin titer was 1: 2048.  There has been no problems with fever.  She has had no issues with COVID.  She has had no change in bowel or bladder habits..  She has had no rashes.  There is been no obvious bleeding.  She is not taking Christine valsartan  which is why Christine blood pressure is on the high side.  She will talk to Christine family doctor about this.  Overall, I would say that Christine performance status is probably ECOG 1.     Medications:  Current Outpatient Medications:    acetaminophen  (TYLENOL ) 500 MG tablet, Take 500-1,000 mg by mouth every 6 (six) hours as needed for mild pain or headache., Disp: , Rfl:    amoxicillin (AMOXIL) 500 MG tablet, Take 500 mg by mouth. Before dental work, Disp: , Rfl:    buPROPion  (WELLBUTRIN  XL) 300 MG 24 hr tablet, Take 300 mg by mouth every morning., Disp: , Rfl:    Cholecalciferol (VITAMIN D3 MAXIMUM STRENGTH) 125 MCG (5000 UT) capsule, Take 5,000 Units by mouth daily., Disp: , Rfl:    EPIPEN 2-PAK 0.3 MG/0.3ML SOAJ injection, Inject 0.3 mg into the muscle once as needed for anaphylaxis., Disp: , Rfl: 6   finasteride (PROSCAR) 5 MG tablet, Take 2.5 mg by mouth daily., Disp: , Rfl:    folic acid  (FOLVITE ) 1 MG tablet, TAKE 2 TABLETS BY MOUTH EVERY DAY, Disp: 360  tablet, Rfl: 0   hydrochlorothiazide  (MICROZIDE ) 12.5 MG capsule, Take 12.5 mg by mouth daily., Disp: , Rfl: 11   meclizine (ANTIVERT) 25 MG tablet, Take 25 mg by mouth as needed for dizziness or nausea., Disp: , Rfl:    montelukast (SINGULAIR) 10 MG tablet, Take 10 mg by mouth as needed (allergies)., Disp: , Rfl:    rosuvastatin (CRESTOR) 20 MG tablet, Take 20 mg by mouth daily., Disp: , Rfl:    SYNTHROID  88 MCG tablet, Take 88 mcg by mouth daily before breakfast. , Disp: , Rfl:    valsartan  (DIOVAN ) 80 MG tablet, Take 1 tablet (80 mg total) by mouth daily., Disp: 90 tablet, Rfl: 3   zolpidem  (AMBIEN ) 10 MG tablet, Take 1 tablet (10 mg total) by mouth at bedtime., Disp: 30 tablet, Rfl: 3 No current facility-administered medications for this visit.  Facility-Administered Medications Ordered in Other Visits:    regadenoson  (LEXISCAN ) injection SOLN 0.4 mg, 0.4 mg, Intravenous, Once, Maudine Sos, MD  Allergies:  Allergies  Allergen Reactions   Bee Venom Anaphylaxis and Shortness Of Breath   Losartan Potassium Other (See Comments)   Other Other (See Comments)   Varenicline Other (See Comments)    Past Medical History, Surgical history, Social history, and Family History  were reviewed and updated.  Review of Systems: Review of Systems  Constitutional:  Positive for fatigue.  HENT:  Negative.    Eyes: Negative.   Respiratory: Negative.    Cardiovascular: Negative.   Gastrointestinal: Negative.   Endocrine: Negative.   Genitourinary: Negative.    Musculoskeletal: Negative.   Skin: Negative.   Neurological: Negative.   Hematological: Negative.   Psychiatric/Behavioral: Negative.      Physical Exam:  Vital signs are temperature 98.7.  Pulse 73.  Blood pressure 169/74.  Weight is 135 pounds.  Wt Readings from Last 3 Encounters:  11/06/23 134 lb 9.6 oz (61.1 kg)  10/09/23 134 lb (60.8 kg)  10/09/23 134 lb (60.8 kg)    Physical Exam Vitals reviewed.  HENT:     Head:  Normocephalic and atraumatic.  Eyes:     Pupils: Pupils are equal, round, and reactive to light.  Cardiovascular:     Rate and Rhythm: Normal rate and regular rhythm.     Heart sounds: Normal heart sounds.  Pulmonary:     Effort: Pulmonary effort is normal.     Breath sounds: Normal breath sounds.  Abdominal:     General: Bowel sounds are normal.     Palpations: Abdomen is soft.  Musculoskeletal:        General: No tenderness or deformity. Normal range of motion.     Cervical back: Normal range of motion.  Lymphadenopathy:     Cervical: No cervical adenopathy.  Skin:    General: Skin is warm and dry.     Findings: No erythema or rash.  Neurological:     Mental Status: She is alert and oriented to person, place, and time.  Psychiatric:        Behavior: Behavior normal.        Thought Content: Thought content normal.        Judgment: Judgment normal.     Lab Results  Component Value Date   WBC 5.4 10/09/2023   HGB 13.2 10/09/2023   HCT 38.8 10/09/2023   MCV 93.5 10/09/2023   PLT 191 10/09/2023     Chemistry      Component Value Date/Time   NA CANCELED 10/16/2023 1004   NA 133 02/13/2014 1108   K CANCELED 10/16/2023 1004   K 4.9 (H) 02/13/2014 1108   CL CANCELED 10/16/2023 1004   CL 97 (L) 02/13/2014 1108   CO2 CANCELED 10/16/2023 1004   CO2 32 02/13/2014 1108   BUN CANCELED 10/16/2023 1004   BUN 13 02/13/2014 1108   CREATININE CANCELED 10/16/2023 1004   CREATININE 0.79 10/09/2023 1337   CREATININE 0.4 (L) 02/13/2014 1108      Component Value Date/Time   CALCIUM CANCELED 10/16/2023 1004   CALCIUM 9.3 02/13/2014 1108   ALKPHOS 64 10/09/2023 1337   ALKPHOS 63 02/13/2014 1108   AST 20 10/09/2023 1337   ALT 18 10/09/2023 1337   ALT 15 02/13/2014 1108   BILITOT 2.2 (H) 10/09/2023 1337       Impression and Plan: Christine [name] is Christine 78 year old white female.  She is Christine history of cold agglutinin disease.  We treated Christine with Rituxan  back in April 2015.  She  tolerated this quite well.  She then had Christine relapse.  We went ahead and treated Christine with Rituxan /bendamustine .  She had 4 cycles of treatment thus completed in February 2021.  So far, Christine blood counts holding nicely steady.  Of regimen reticulocyte count is only 2.6%.  This is  fantastic.  Will still plan to get Christine back in 6 months.  I think with the warm weather, this should help Christine blood.  I am happy that she has done so well.  I will certainly pray for Christine as she is going through Christine lot of family issues. Aaron Aas   Ivor Mars, MD 4/28/20252:07 PM

## 2024-04-11 LAB — COLD AGGLUTININ TITER: Cold Agglutinin Titer: 1:512 {titer} — ABNORMAL HIGH

## 2024-05-22 ENCOUNTER — Emergency Department (HOSPITAL_COMMUNITY)

## 2024-05-22 ENCOUNTER — Inpatient Hospital Stay (HOSPITAL_COMMUNITY)

## 2024-05-22 ENCOUNTER — Observation Stay (HOSPITAL_COMMUNITY)
Admission: EM | Admit: 2024-05-22 | Discharge: 2024-05-24 | Disposition: A | Discharge status: Home Health | Attending: Student | Admitting: Student

## 2024-05-22 ENCOUNTER — Other Ambulatory Visit: Payer: Self-pay

## 2024-05-22 ENCOUNTER — Encounter (HOSPITAL_COMMUNITY): Payer: Self-pay | Admitting: Emergency Medicine

## 2024-05-22 DIAGNOSIS — E785 Hyperlipidemia, unspecified: Secondary | ICD-10-CM | POA: Insufficient documentation

## 2024-05-22 DIAGNOSIS — F419 Anxiety disorder, unspecified: Secondary | ICD-10-CM | POA: Diagnosis not present

## 2024-05-22 DIAGNOSIS — D5912 Cold autoimmune hemolytic anemia: Secondary | ICD-10-CM

## 2024-05-22 DIAGNOSIS — R202 Paresthesia of skin: Secondary | ICD-10-CM | POA: Insufficient documentation

## 2024-05-22 DIAGNOSIS — D649 Anemia, unspecified: Secondary | ICD-10-CM | POA: Diagnosis present

## 2024-05-22 DIAGNOSIS — N390 Urinary tract infection, site not specified: Secondary | ICD-10-CM | POA: Diagnosis present

## 2024-05-22 DIAGNOSIS — R748 Abnormal levels of other serum enzymes: Secondary | ICD-10-CM | POA: Diagnosis not present

## 2024-05-22 DIAGNOSIS — F17210 Nicotine dependence, cigarettes, uncomplicated: Secondary | ICD-10-CM | POA: Diagnosis not present

## 2024-05-22 DIAGNOSIS — R059 Cough, unspecified: Secondary | ICD-10-CM | POA: Insufficient documentation

## 2024-05-22 DIAGNOSIS — R531 Weakness: Secondary | ICD-10-CM | POA: Insufficient documentation

## 2024-05-22 DIAGNOSIS — R7401 Elevation of levels of liver transaminase levels: Secondary | ICD-10-CM | POA: Diagnosis present

## 2024-05-22 DIAGNOSIS — E039 Hypothyroidism, unspecified: Secondary | ICD-10-CM | POA: Insufficient documentation

## 2024-05-22 DIAGNOSIS — G4701 Insomnia due to medical condition: Secondary | ICD-10-CM

## 2024-05-22 DIAGNOSIS — R55 Syncope and collapse: Secondary | ICD-10-CM | POA: Diagnosis present

## 2024-05-22 DIAGNOSIS — Z79899 Other long term (current) drug therapy: Secondary | ICD-10-CM | POA: Insufficient documentation

## 2024-05-22 DIAGNOSIS — M25462 Effusion, left knee: Secondary | ICD-10-CM | POA: Diagnosis not present

## 2024-05-22 DIAGNOSIS — F341 Dysthymic disorder: Secondary | ICD-10-CM | POA: Diagnosis present

## 2024-05-22 DIAGNOSIS — I10 Essential (primary) hypertension: Secondary | ICD-10-CM | POA: Insufficient documentation

## 2024-05-22 DIAGNOSIS — N3000 Acute cystitis without hematuria: Secondary | ICD-10-CM | POA: Diagnosis not present

## 2024-05-22 DIAGNOSIS — F32A Depression, unspecified: Secondary | ICD-10-CM | POA: Diagnosis not present

## 2024-05-22 DIAGNOSIS — D501 Sideropenic dysphagia: Secondary | ICD-10-CM

## 2024-05-22 LAB — HEPATITIS PANEL, ACUTE
HCV Ab: NONREACTIVE
Hep A IgM: NONREACTIVE
Hep B C IgM: NONREACTIVE
Hepatitis B Surface Ag: NONREACTIVE

## 2024-05-22 LAB — URINALYSIS, W/ REFLEX TO CULTURE (INFECTION SUSPECTED)
Bilirubin Urine: NEGATIVE
Glucose, UA: NEGATIVE mg/dL
Hgb urine dipstick: NEGATIVE
Ketones, ur: NEGATIVE mg/dL
Nitrite: NEGATIVE
Protein, ur: NEGATIVE mg/dL
Specific Gravity, Urine: 1.009 (ref 1.005–1.030)
pH: 7 (ref 5.0–8.0)

## 2024-05-22 LAB — COMPREHENSIVE METABOLIC PANEL WITH GFR
ALT: 201 U/L — ABNORMAL HIGH (ref 0–44)
AST: 224 U/L — ABNORMAL HIGH (ref 15–41)
Albumin: 3.9 g/dL (ref 3.5–5.0)
Alkaline Phosphatase: 55 U/L (ref 38–126)
Anion gap: 12 (ref 5–15)
BUN: 14 mg/dL (ref 8–23)
CO2: 21 mmol/L — ABNORMAL LOW (ref 22–32)
Calcium: 9 mg/dL (ref 8.9–10.3)
Chloride: 98 mmol/L (ref 98–111)
Creatinine, Ser: 0.64 mg/dL (ref 0.44–1.00)
GFR, Estimated: >60 mL/min (ref >60)
Glucose, Bld: 126 mg/dL — ABNORMAL HIGH (ref 70–99)
Potassium: 4.1 mmol/L (ref 3.5–5.1)
Sodium: 131 mmol/L — ABNORMAL LOW (ref 135–145)
Total Bilirubin: 2 mg/dL — ABNORMAL HIGH (ref 0.0–1.2)
Total Protein: 6.4 g/dL — ABNORMAL LOW (ref 6.5–8.1)

## 2024-05-22 LAB — DIFFERENTIAL
Abs Immature Granulocytes: 0.02 10*3/uL (ref 0.00–0.07)
Basophils Absolute: 0 10*3/uL (ref 0.0–0.1)
Basophils Relative: 0 %
Eosinophils Absolute: 0 10*3/uL (ref 0.0–0.5)
Eosinophils Relative: 0 %
Immature Granulocytes: 0 %
Lymphocytes Relative: 6 %
Lymphs Abs: 0.6 10*3/uL — ABNORMAL LOW (ref 0.7–4.0)
Monocytes Absolute: 0.6 10*3/uL (ref 0.1–1.0)
Monocytes Relative: 6 %
Neutro Abs: 8.1 10*3/uL — ABNORMAL HIGH (ref 1.7–7.7)
Neutrophils Relative %: 88 %

## 2024-05-22 LAB — CBC
HCT: 34.8 % — ABNORMAL LOW (ref 36.0–46.0)
Hemoglobin: 12.2 g/dL (ref 12.0–15.0)
MCH: 32.1 pg (ref 26.0–34.0)
MCHC: 35.1 g/dL (ref 30.0–36.0)
MCV: 91.6 fL (ref 80.0–100.0)
Platelets: 173 10*3/uL (ref 150–400)
RBC: 3.8 MIL/uL — ABNORMAL LOW (ref 3.87–5.11)
RDW: 13.4 % (ref 11.5–15.5)
WBC: 9.3 10*3/uL (ref 4.0–10.5)
nRBC: 0 % (ref 0.0–0.2)

## 2024-05-22 LAB — RAPID URINE DRUG SCREEN, HOSP PERFORMED
Amphetamines: NOT DETECTED
Barbiturates: NOT DETECTED
Benzodiazepines: NOT DETECTED
Cocaine: NOT DETECTED
Opiates: NOT DETECTED
Tetrahydrocannabinol: NOT DETECTED

## 2024-05-22 LAB — RESP PANEL BY RT-PCR (RSV, FLU A&B, COVID)  RVPGX2
Influenza A by PCR: NEGATIVE
Influenza B by PCR: NEGATIVE
Resp Syncytial Virus by PCR: NEGATIVE
SARS Coronavirus 2 by RT PCR: NEGATIVE

## 2024-05-22 LAB — PROTIME-INR
INR: 1.1 (ref 0.8–1.2)
Prothrombin Time: 14 s (ref 11.4–15.2)

## 2024-05-22 LAB — APTT: aPTT: 28 s (ref 24–36)

## 2024-05-22 LAB — CK: Total CK: 44 U/L (ref 38–234)

## 2024-05-22 LAB — TROPONIN I (HIGH SENSITIVITY)
Troponin I (High Sensitivity): 3 ng/L (ref <18)
Troponin I (High Sensitivity): 3 ng/L

## 2024-05-22 LAB — ETHANOL: Alcohol, Ethyl (B): <15 mg/dL (ref <15)

## 2024-05-22 MED ORDER — ZOLPIDEM TARTRATE 5 MG PO TABS
5.0000 mg | ORAL_TABLET | Freq: Every day | ORAL | Status: DC
  Administered 2024-05-22 – 2024-05-23 (×2): 5 mg via ORAL
  Filled 2024-05-22 (×2): qty 1

## 2024-05-22 MED ORDER — VITAMIN D 25 MCG (1000 UNIT) PO TABS
5000.0000 [IU] | ORAL_TABLET | Freq: Every day | ORAL | Status: DC
  Administered 2024-05-23 – 2024-05-24 (×2): 5000 [IU] via ORAL
  Filled 2024-05-22 (×2): qty 5

## 2024-05-22 MED ORDER — ROSUVASTATIN CALCIUM 20 MG PO TABS
20.0000 mg | ORAL_TABLET | Freq: Every day | ORAL | Status: DC
  Administered 2024-05-23: 20 mg via ORAL
  Filled 2024-05-22: qty 1

## 2024-05-22 MED ORDER — IRBESARTAN 75 MG PO TABS
75.0000 mg | ORAL_TABLET | Freq: Every day | ORAL | Status: DC
  Administered 2024-05-23 – 2024-05-24 (×2): 75 mg via ORAL
  Filled 2024-05-22 (×2): qty 1

## 2024-05-22 MED ORDER — LORATADINE 10 MG PO TABS
10.0000 mg | ORAL_TABLET | Freq: Every day | ORAL | Status: DC
  Administered 2024-05-23: 10 mg via ORAL
  Filled 2024-05-22 (×2): qty 1

## 2024-05-22 MED ORDER — ENOXAPARIN SODIUM 40 MG/0.4ML IJ SOSY
40.0000 mg | PREFILLED_SYRINGE | INTRAMUSCULAR | Status: DC
  Administered 2024-05-22 – 2024-05-23 (×2): 40 mg via SUBCUTANEOUS
  Filled 2024-05-22 (×2): qty 0.4

## 2024-05-22 MED ORDER — MORPHINE SULFATE (PF) 2 MG/ML IV SOLN
2.0000 mg | INTRAVENOUS | Status: DC | PRN
  Administered 2024-05-23: 2 mg via INTRAVENOUS
  Filled 2024-05-22: qty 1

## 2024-05-22 MED ORDER — CLOBETASOL PROPIONATE 0.05 % EX OINT: 1.0000 | TOPICAL_OINTMENT | Freq: Every day | CUTANEOUS | Status: DC | PRN

## 2024-05-22 MED ORDER — ASPIRIN 81 MG PO TBEC
81.0000 mg | DELAYED_RELEASE_TABLET | Freq: Every day | ORAL | Status: DC
  Administered 2024-05-23 – 2024-05-24 (×2): 81 mg via ORAL
  Filled 2024-05-22 (×2): qty 1

## 2024-05-22 MED ORDER — MONTELUKAST SODIUM 10 MG PO TABS: 10.0000 mg | ORAL_TABLET | Freq: Every day | ORAL | Status: DC | PRN

## 2024-05-22 MED ORDER — FENTANYL CITRATE PF 50 MCG/ML IJ SOSY
25.0000 ug | PREFILLED_SYRINGE | Freq: Once | INTRAMUSCULAR | Status: AC
  Administered 2024-05-22: 25 ug via INTRAVENOUS
  Filled 2024-05-22: qty 1

## 2024-05-22 MED ORDER — LEVOTHYROXINE SODIUM 88 MCG PO TABS
88.0000 ug | ORAL_TABLET | Freq: Every day | ORAL | Status: DC
  Administered 2024-05-23 – 2024-05-24 (×2): 88 ug via ORAL
  Filled 2024-05-22 (×2): qty 1

## 2024-05-22 MED ORDER — SODIUM CHLORIDE 0.9 % IV SOLN: 1.0000 g | INTRAVENOUS | Status: DC

## 2024-05-22 MED ORDER — SODIUM CHLORIDE 0.9 % IV SOLN
1.0000 g | INTRAVENOUS | Status: DC
  Administered 2024-05-23 – 2024-05-24 (×2): 1 g via INTRAVENOUS
  Filled 2024-05-22 (×2): qty 10

## 2024-05-22 MED ORDER — ACETAMINOPHEN 500 MG PO TABS
1000.0000 mg | ORAL_TABLET | Freq: Once | ORAL | Status: AC
  Administered 2024-05-22: 1000 mg via ORAL
  Filled 2024-05-22: qty 2

## 2024-05-22 MED ORDER — SODIUM CHLORIDE 0.9 % IV SOLN
1.0000 g | Freq: Once | INTRAVENOUS | Status: AC
  Administered 2024-05-22: 1 g via INTRAVENOUS
  Filled 2024-05-22: qty 10

## 2024-05-22 MED ORDER — HYDROCHLOROTHIAZIDE 25 MG PO TABS
12.5000 mg | ORAL_TABLET | Freq: Every day | ORAL | Status: DC
  Administered 2024-05-23 – 2024-05-24 (×2): 12.5 mg via ORAL
  Filled 2024-05-22 (×2): qty 1

## 2024-05-22 MED ORDER — MECLIZINE HCL 25 MG PO TABS: 25.0000 mg | ORAL_TABLET | Freq: Three times a day (TID) | ORAL | Status: DC | PRN

## 2024-05-22 MED ORDER — FOLIC ACID 1 MG PO TABS
2.0000 mg | ORAL_TABLET | Freq: Every day | ORAL | Status: DC
  Administered 2024-05-23 – 2024-05-24 (×2): 2 mg via ORAL
  Filled 2024-05-22 (×2): qty 2

## 2024-05-22 MED ORDER — IBUPROFEN 400 MG PO TABS
400.0000 mg | ORAL_TABLET | Freq: Once | ORAL | Status: AC
  Administered 2024-05-22: 400 mg via ORAL
  Filled 2024-05-22: qty 1

## 2024-05-22 MED ORDER — ACETAMINOPHEN 500 MG PO TABS
500.0000 mg | ORAL_TABLET | Freq: Four times a day (QID) | ORAL | Status: DC | PRN
  Administered 2024-05-23: 1000 mg via ORAL
  Filled 2024-05-22: qty 2

## 2024-05-22 MED ORDER — BUPROPION HCL ER (XL) 150 MG PO TB24
300.0000 mg | ORAL_TABLET | Freq: Every morning | ORAL | Status: DC
  Administered 2024-05-23 – 2024-05-24 (×2): 300 mg via ORAL
  Filled 2024-05-22 (×2): qty 2

## 2024-05-22 MED ORDER — SODIUM CHLORIDE 0.9 % IV SOLN: INTRAVENOUS | Status: AC

## 2024-05-22 NOTE — ED Notes (Signed)
 Pt to MRI

## 2024-05-22 NOTE — ED Provider Notes (Signed)
 Received patient at signout from previous provider pending hospitalist admission. See her note.  In short, patient presents to emergency department for evaluation of headache, paresthesia, right-sided weakness that started prior to going to sleep last evening at 9 PM.  She then lowered herself to the ground and had a syncopal episode and lost consciousness for couple seconds.  Currently complaining of headache, right wrist, right shoulder pain.  On exam no obvious weakness nor focal deficit appreciated by previous EDP.  ED workup notable for negative CT, MRI for CVA/TIA.  X-ray of chest, right wrist, right shoulder negative for fracture.  Lab work notable for UTI and transaminitis of AST 224 ALT 201 total bili 2.  Urine culture pending.  1 g Rocephin provided for UTI.  On reassessment, patient was unable to ambulate due to generalized weakness and bodyaches.    Patient will be admitted for UTI, elevated liver enzymes and general weakness.  Consulted hospitalist Dr. Carlton Chick who accepted patient for admission   Royann Cords, [place] 05/22/24 1844    Hershel Los, MD 05/22/24 2201

## 2024-05-22 NOTE — ED Notes (Signed)
 This RN called Christine [name] to attempt report on pt. Christine [name] reported that they can not take the pt with Q2h NIHSS assessments. They reported they have already reached out to [facility] about the situation. [facility] ED charge RN made aware of delay in taking pt up

## 2024-05-22 NOTE — ED Triage Notes (Signed)
 Pt reports yesterday before going to bed at 2130 her hands and feet began to tingle. Pt reports at 0200 this morning she slid out of the bed and onto her left side. Pt reports right sided numbness and weakness at this. Also reports neck pain that started prior to going to bed.

## 2024-05-22 NOTE — ED Notes (Signed)
 Charge RN called provider who gave a verbal order to d/c Q2 neuro checks.  That was placed by ED staff.  RN did d/c order and notified AC so that bed can be assigned.

## 2024-05-22 NOTE — ED Notes (Signed)
 CCMD contacted

## 2024-05-22 NOTE — H&P (Signed)
 History and Physical    Patient: Christine [name] UJW:[id number] DOB: 1946/03/13 DOA: 05/22/2024 DOS: the patient was seen and examined on 05/22/2024 PCP: Jeannine Milroy., MD  Patient coming from: Home  Chief Complaint:  Chief Complaint  Patient presents with   Numbness   HPI: Christine [name] is a 78 y.o. female with medical history significant of essential hypertension, previous history of DVT, osteoarthritis status post left total knee replacement, anemia of chronic disease, hypothyroidism, aortic aneurysm and previous history of transaminitis who presented to the ER with suspected syncope versus fall.  Patient has no recollection of what happened.  She apparently was sleeping in the bed with the husband also in the room when she suddenly found herself on the floor.  Not sure of how she got there.  Patient however is having pain all over.  It is worse on the left knee where she had previous total knee replacement.  She also has pain in her bilateral wrists.  In the ER workup was performed for possible CVA.  Head CT without contrast and MRI of the brain were performed both negative.  All other x-rays were negative but urinalysis was consistent with UTI.  Initiated on IV Rocephin and being admitted to the medical service for treatment.  Review of Systems: As mentioned in the history of present illness. All other systems reviewed and are negative. 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   Deep venous thrombosis of calf (HCC) 2002   Depression    Diverticulosis    Goals of care, counseling/discussion 10/11/2019   History of blood transfusion 2009   Hypertension    Osteopenia    PONV (postoperative nausea and vomiting)    nausea only yrs ago   Tubular adenoma of colon 2011   Past Surgical History:  Procedure Laterality Date   ABDOMINAL HYSTERECTOMY  age 34   cataract surgery Bilateral 2011    CHOLECYSTECTOMY N/A 09/20/2013   Procedure: LAPAROSCOPIC CHOLECYSTECTOMY WITH INTRAOPERATIVE CHOLANGIOGRAM;  Surgeon: Harlee Lichtenstein, MD;  Location: [facility];  Service: General;  Laterality: N/A;   KNEE ARTHROSCOPY     left knee   REPLACEMENT TOTAL KNEE Left 2006   Social History:  reports that she quit smoking about 14 years ago. Her smoking use included cigarettes. She started smoking about 60 years ago. She has a 22.8 pack-year smoking history. She has never used smokeless tobacco. She reports current alcohol use of about 5.0 standard drinks of alcohol per week. She reports that she does not use drugs.  Allergies  Allergen Reactions   Bee Venom Anaphylaxis and Shortness Of Breath   Losartan Potassium Other (See Comments)   Other Other (See Comments)   Varenicline Other (See Comments)    Family History  Problem Relation Age of Onset   Hypertension Mother    Alcohol abuse Father    Heart attack Father        died age 9   Heart disease Brother    Liver cancer Maternal Grandfather    Heart attack Maternal Grandmother     Prior to Admission medications   Medication Sig Start Date End Date Taking? Authorizing Provider  acetaminophen  (TYLENOL ) 500 MG tablet Take 500-1,000 mg by mouth every 6 (six) hours as needed for mild pain or headache.    [provider]  amoxicillin (AMOXIL) 500 MG tablet Take 500 mg by mouth.  Before dental work Patient not taking: Reported on 04/08/2024 12/26/21   [provider]  buPROPion  (WELLBUTRIN  XL) 300 MG 24 hr tablet Take 300 mg by mouth every morning. 08/15/13   [provider]  Cholecalciferol (VITAMIN D3 MAXIMUM STRENGTH) 125 MCG (5000 UT) capsule Take 5,000 Units by mouth daily. Patient not taking: Reported on 04/08/2024    [provider]  EPIPEN 2-PAK 0.3 MG/0.3ML SOAJ injection Inject 0.3 mg into the muscle once as needed for anaphylaxis. Patient not taking: Reported on 04/08/2024 08/07/14   [provider]   finasteride (PROSCAR) 5 MG tablet Take 2.5 mg by mouth daily. 01/08/20   [provider]  folic acid  (FOLVITE ) 1 MG tablet TAKE 2 TABLETS BY MOUTH EVERY DAY 02/07/24   Ivor Mars, MD  hydrochlorothiazide  (MICROZIDE ) 12.5 MG capsule Take 12.5 mg by mouth daily. 04/24/18   [provider]  meclizine (ANTIVERT) 25 MG tablet Take 25 mg by mouth as needed for dizziness or nausea. Patient not taking: Reported on 04/08/2024 12/25/18   [provider]  montelukast (SINGULAIR) 10 MG tablet Take 10 mg by mouth as needed (allergies). Patient not taking: Reported on 04/08/2024 05/05/23   [provider]  rosuvastatin (CRESTOR) 20 MG tablet Take 20 mg by mouth daily. 05/07/21   [provider]  SYNTHROID  88 MCG tablet Take 88 mcg by mouth daily before breakfast.  09/17/19   [provider]  valsartan  (DIOVAN ) 80 MG tablet Take 1 tablet (80 mg total) by mouth daily. Patient not taking: Reported on 04/08/2024 10/09/23   Leala Prince, PA-C  zolpidem  (AMBIEN ) 10 MG tablet Take 1 tablet (10 mg total) by mouth at bedtime. 11/24/17   Ivor Mars, MD    Physical Exam: Vitals:   05/22/24 1339 05/22/24 1352 05/22/24 1400 05/22/24 1729  BP:  (!) 152/72 (!) 148/72 (!) 140/69  Pulse:  70 70 72  Resp:  14 17 17   Temp: 98 F (36.7 C)   97.8 F (36.6 C)  TempSrc: Oral     SpO2:  100% 100% 98%   Constitutional: Chronically ill looking no distress, calm, comfortable Eyes: PERRL, lids and conjunctivae normal ENMT: Mucous membranes are moist. Posterior pharynx clear of any exudate or lesions.Normal dentition.  Neck: normal, supple, no masses, no thyromegaly Respiratory: clear to auscultation bilaterally, no wheezing, no crackles. Normal respiratory effort. No accessory muscle use.  Cardiovascular: Regular rate and rhythm, no murmurs / rubs / gallops. No extremity edema. 2+ pedal pulses. No carotid bruits.  Abdomen: no tenderness, no masses palpated. No  hepatosplenomegaly. Bowel sounds positive.  Musculoskeletal: Left knee swollen and tender, bilateral wrists also tender, no other injuries, Skin: no rashes, lesions, ulcers. No induration Neurologic: CN 2-12 grossly intact. Sensation intact, DTR normal. Strength 5/5 in all 4.  Psychiatric: Normal judgment and insight. Alert and oriented x 3. Normal mood  Data Reviewed:  Blood pressure 154/74 and pulse 84, sodium 131 glucose 126 and CO2 20.  AST 224 ALT 201 total bili 2.1 total protein 6.4 acute viral screen is negative for COVID and influenza urinalysis showed WBC 11-20 with many bacteria.  Small leukocytes.  Hazy urine urine drug screen is negative MRI of the brain as well as head CT without contrast were all negative CT cervical spine showed no acute findings head CT without contrast negative chest x-ray showed no acute findings x-ray of the shoulder showed no significant findings x-ray of the left knee showed knee effusion  Assessment and  Plan:  #1 syncope versus fall: Not sure of the cause.  Workup so far negative.  MRI of the brain negative.  Possibly may need echocardiogram at some point.  In the meantime PT and OT consultation.  Also pain management.  #2 UTI: Patient will be initiated on IV Rocephin.  Urine cultures obtained.  Blood cultures pending.  Continue to monitor  #3 left knee effusion: Suspected injury to the knee probably after falling.  Patient require orthopedics consult.  In the meantime initiate PT.  #4 Transaminitis: Chronic.  Has had it for a while.  Probably hepatitis B or C chronically.  Could also be medication as patient is on a statin.  Patient may require right upper quadrant abdominal ultrasound.  If LFTs continue to rise will likely need to stop statin.  #5 depression with anxiety: Continue Wellbutrin .  Continue other supportive care  #6 hyperlipidemia: Patient is on Crestor 20 mg.  #7 hypothyroidism: Continue levothyroxine   #8 essential hypertension: Patient  on valsartan  and hydrochlorothiazide .  Will resume.    Advance Care Planning:   Code Status: Full Code   Consults: None at the moment.  Family Communication: Daughter at bedside  Severity of Illness: The appropriate patient status for this patient is INPATIENT. Inpatient status is judged to be reasonable and necessary in order to provide the required intensity of service to ensure the patient's safety. The patient's presenting symptoms, physical exam findings, and initial radiographic and laboratory data in the context of their chronic comorbidities is felt to place them at high risk for further clinical deterioration. Furthermore, it is not anticipated that the patient will be medically stable for discharge from the hospital within 2 midnights of admission.   * I certify that at the point of admission it is my clinical judgment that the patient will require inpatient hospital care spanning beyond 2 midnights from the point of admission due to high intensity of service, high risk for further deterioration and high frequency of surveillance required.*  AuthorCarolin Chyle, MD 05/22/2024 6:43 PM  For on call review [email or web address].

## 2024-05-22 NOTE — ED Notes (Signed)
 Patient transported to MRI

## 2024-05-22 NOTE — ED Notes (Signed)
 Pt given ginger ale to drink per request. Family at bedside. No other needs voiced at this time. Pt on monitor and call light within reach

## 2024-05-22 NOTE — ED Provider Notes (Addendum)
 [place] [facility] Provider Note   CSN: [id number] Arrival date & time: 05/22/24  4403     History  Chief Complaint  Patient presents with   Numbness    Christine [name] is a 78 y.o. female with PMHx anxiety, OS, depression, HTN, hypothyroidism, aneurysm of ascending aorta who presents to ED concerned for right sided tingling/weakness. These symptoms are also mildly present on the left side, but patient stating that the right side is more severe. Symptoms started around 9:00PM last night.   Patient stating that she was walking around fine last night and went to read a book in bed when she started developing a headache along with the hand/feet tingling. These symptoms then progressed to increasing body aches of the UE and LE. Patient went to bed and woke up around 2AM feeling the same way so she tried to get out of bed but felt too weak on the right side. Patient then slid down onto the floor so that she could scoot on the floor to get her husband's attention (which is difficult as he wears hearing aids). While patient was trying to lower herself to the ground, she became nauseated had a syncopal episode and loss consciousness for a couple of seconds. Patient was not able to get his attention so she fell asleep again on the floor. Patient then woke up a couple of hours later and her husband tried to help her get up but then he fell and patient and husband laid on the floor for a little bit longer.   Patient still currently has a headache. Patient also endorsing a very mild chronic cough at baseline. Denies fever, vomiting, diarrhea, dysuria, hematuria, chest pain, SOB.   HPI     Home Medications Prior to Admission medications   Medication Sig Start Date End Date Taking? Authorizing Provider  acetaminophen  (TYLENOL ) 500 MG tablet Take 500-1,000 mg by mouth every 6 (six) hours as needed for mild pain or headache.    [provider]   amoxicillin (AMOXIL) 500 MG tablet Take 500 mg by mouth. Before dental work Patient not taking: Reported on 04/08/2024 12/26/21   [provider]  buPROPion  (WELLBUTRIN  XL) 300 MG 24 hr tablet Take 300 mg by mouth every morning. 08/15/13   [provider]  Cholecalciferol (VITAMIN D3 MAXIMUM STRENGTH) 125 MCG (5000 UT) capsule Take 5,000 Units by mouth daily. Patient not taking: Reported on 04/08/2024    [provider]  EPIPEN 2-PAK 0.3 MG/0.3ML SOAJ injection Inject 0.3 mg into the muscle once as needed for anaphylaxis. Patient not taking: Reported on 04/08/2024 08/07/14   [provider]  finasteride (PROSCAR) 5 MG tablet Take 2.5 mg by mouth daily. 01/08/20   [provider]  folic acid  (FOLVITE ) 1 MG tablet TAKE 2 TABLETS BY MOUTH EVERY DAY 02/07/24   Ivor Mars, MD  hydrochlorothiazide  (MICROZIDE ) 12.5 MG capsule Take 12.5 mg by mouth daily. 04/24/18   [provider]  meclizine (ANTIVERT) 25 MG tablet Take 25 mg by mouth as needed for dizziness or nausea. Patient not taking: Reported on 04/08/2024 12/25/18   [provider]  montelukast (SINGULAIR) 10 MG tablet Take 10 mg by mouth as needed (allergies). Patient not taking: Reported on 04/08/2024 05/05/23   [provider]  rosuvastatin (CRESTOR) 20 MG tablet Take 20 mg by mouth daily. 05/07/21   [provider]  SYNTHROID  88 MCG tablet Take 88 mcg by mouth daily before breakfast.  09/17/19   [provider]  valsartan  (DIOVAN ) 80 MG tablet Take 1 tablet (80 mg total) by mouth daily. Patient not taking: Reported on 04/08/2024 10/09/23   Leala Prince, PA-C  zolpidem  (AMBIEN ) 10 MG tablet Take 1 tablet (10 mg total) by mouth at bedtime. 11/24/17   Ivor Mars, MD      Allergies    Bee venom, Losartan potassium, Other, and Varenicline    Review of Systems   Review of Systems  Neurological:  Positive for headaches.    Physical Exam Updated Vital  Signs BP (!) 152/72   Pulse 70   Temp 98 F (36.7 C) (Oral)   Resp 14   SpO2 100%  Physical Exam Vitals and nursing note reviewed.  Constitutional:      General: She is not in acute distress.    Appearance: She is not ill-appearing or toxic-appearing.  HENT:     Head: Normocephalic and atraumatic.     Mouth/Throat:     Mouth: Mucous membranes are moist.  Eyes:     General: No scleral icterus.       Right eye: No discharge.        Left eye: No discharge.     Conjunctiva/sclera: Conjunctivae normal.  Cardiovascular:     Rate and Rhythm: Normal rate and regular rhythm.     Pulses: Normal pulses.     Heart sounds: Normal heart sounds. No murmur heard. Pulmonary:     Effort: Pulmonary effort is normal. No respiratory distress.     Breath sounds: Normal breath sounds. No wheezing, rhonchi or rales.  Abdominal:     General: Abdomen is flat. Bowel sounds are normal. There is no distension.     Palpations: Abdomen is soft. There is no mass.     Tenderness: There is no abdominal tenderness.  Musculoskeletal:     Right lower leg: No edema.     Left lower leg: No edema.  Skin:    General: Skin is warm and dry.     Findings: No rash.  Neurological:     General: No focal deficit present.     Mental Status: She is alert and oriented to person, place, and time. Mental status is at baseline.     Comments: GCS 15. Speech is goal oriented. No deficits appreciated to CN III-XII; symmetric eyebrow raise, no facial drooping, tongue midline. Patient has equal grip strength bilaterally with 5/5 strength against resistance in all major muscle groups bilaterally. Sensation to light touch intact. Patient moves extremities without ataxia.    Psychiatric:        Mood and Affect: Mood normal.        Behavior: Behavior normal.     ED Results / Procedures / Treatments   Labs (all labs ordered are listed, but only abnormal results are displayed) Labs Reviewed  CBC - Abnormal; Notable for the  following components:      Result Value   RBC 3.80 (*)    HCT 34.8 (*)    All other components within normal limits  DIFFERENTIAL - Abnormal; Notable for the following components:   Neutro Abs 8.1 (*)    Lymphs Abs 0.6 (*)    All other components within normal limits  URINALYSIS, W/ REFLEX TO CULTURE (INFECTION SUSPECTED) - Abnormal; Notable for the following components:   APPearance HAZY (*)    Leukocytes,Ua SMALL (*)    Bacteria, UA MANY (*)    All other components within normal limits  COMPREHENSIVE METABOLIC PANEL WITH GFR - Abnormal; Notable for the following components:   Sodium 131 (*)    CO2 21 (*)    Glucose, Bld 126 (*)    Total Protein 6.4 (*)    AST 224 (*)    ALT 201 (*)    Total Bilirubin 2.0 (*)    All other components within normal limits  RESP PANEL BY RT-PCR (RSV, FLU A&B, COVID)  RVPGX2  URINE CULTURE  PROTIME-INR  APTT  ETHANOL  RAPID URINE DRUG SCREEN, HOSP PERFORMED  CK  HEPATITIS PANEL, ACUTE  TROPONIN I (HIGH SENSITIVITY)  TROPONIN I (HIGH SENSITIVITY)    EKG EKG Interpretation Date/Time:  Wednesday May 22 2024 11:18:39 EDT Ventricular Rate:  79 PR Interval:  207 QRS Duration:  99 QT Interval:  403 QTC Calculation: 462 R Axis:   -38  Text Interpretation: Sinus rhythm Left axis deviation Anterior infarct, old Confirmed by Jerilynn Montenegro [phone]) on 05/22/2024 11:21:48 AM  Radiology MR BRAIN WO CONTRAST Result Date: 05/22/2024 CLINICAL DATA:  Provided history: Headache, neuro deficit. EXAM: MRI HEAD WITHOUT CONTRAST TECHNIQUE: Multiplanar, multiecho pulse sequences of the brain and surrounding structures were obtained without intravenous contrast. COMPARISON:  Head CT 05/22/2024. FINDINGS: Brain: Mild generalized cerebral atrophy. Multifocal T2 FLAIR hyperintense signal abnormality within the cerebral white matter, nonspecific but compatible with mild for age chronic small vessel ischemic disease. No cortical encephalomalacia is identified. There  is no acute infarct. No evidence of an intracranial mass. No chronic intracranial blood products. No extra-axial fluid collection. No midline shift. Vascular: Maintained flow voids within the proximal large arterial vessels. Skull and upper cervical spine: No focal worrisome marrow lesion. Sinuses/Orbits: No mass or acute finding within the imaged orbits. Prior bilateral ocular lens replacement. No significant paranasal sinus disease. Other: Small-volume fluid within left mastoid air cells. IMPRESSION: 1. No evidence of an acute intracranial abnormality. 2. Chronic small vessel ischemic changes within the cerebral white matter, mild for age. 3. Mild generalized cerebral atrophy. 4. Small left mastoid effusion. Electronically Signed   By: Bascom Lily D.O.   On: 05/22/2024 13:22   CT HEAD WO CONTRAST Result Date: 05/22/2024 CLINICAL DATA:  Provided history: Headache, neuro deficit. Neck trauma. Additional history obtained from electronic MEDICAL RECORD NUMBERFall from bed, neck pain. EXAM: CT HEAD WITHOUT CONTRAST CT CERVICAL SPINE WITHOUT CONTRAST TECHNIQUE: Multidetector CT imaging of the head and cervical spine was performed following the standard protocol without intravenous contrast. Multiplanar CT image reconstructions of the cervical spine were also generated. RADIATION DOSE REDUCTION: This exam was performed according to the departmental dose-optimization program which includes automated exposure control, adjustment of the mA and/or kV according to patient size and/or use of iterative reconstruction technique. COMPARISON:  None. FINDINGS: CT HEAD FINDINGS Brain: Mild generalized cerebral atrophy. Patchy and ill-defined hypoattenuation within the cerebral white matter, nonspecific but compatible with mild chronic small vessel ischemic disease. There is no acute intracranial hemorrhage. No demarcated cortical infarct. No extra-axial fluid collection. No evidence of an intracranial mass. No midline shift.  Vascular: No hyperdense vessel.  Atherosclerotic calcifications. Skull: No calvarial fracture or aggressive osseous lesion. Sinuses/Orbits: No mass or acute finding within the imaged orbits. No significant paranasal sinus disease. CT CERVICAL SPINE FINDINGS Alignment: Dextrocurvature of the cervical spine. Nonspecific reversal of the expected cervical lordosis. 2 mm C3-C4 grade 1 anterolisthesis. Slight C5-C6 grade 1 retrolisthesis. 2 mm C7-T1 grade 1 anterolisthesis. Slight T1-T2 grade 1 anterolisthesis. Skull base and vertebrae: The basion-dental and atlanto-dental intervals  are maintained.No evidence of acute fracture to the cervical spine. Soft tissues and spinal canal: No prevertebral fluid or swelling. No visible canal hematoma. Disc levels: Cervical spondylosis with multilevel disc space narrowing, disc bulges/central disc protrusions, endplate spurring, uncovertebral hypertrophy and facet arthropathy. Disc space narrowing is greatest at C5-C6 (advanced) and C6-C7 (moderate-to-advanced). No appreciable high-grade spinal canal stenosis. Multilevel bony neural foraminal narrowing. Degenerative changes also present at the C1-C2 articulation. Small multilevel ventral osteophytes. Upper chest: No consolidation within the imaged lung apices. No visible pneumothorax. Other: Chronic, healed fracture deformities of the posterior left third and fourth ribs. IMPRESSION: Non-contrast head CT: 1.  No evidence of an acute intracranial abnormality. 2. Parenchymal atrophy and chronic small vessel ischemic disease. CT cervical spine: 1. No evidence of an acute cervical spine fracture. 2. Nonspecific reversal of the expected cervical lordosis. 3. Dextrocurvature of the cervical spine. 4. Mild grade 1 spondylolisthesis at C3-C4, C4-C5, C5-C6, C7-T1 and T1-T2. 5. Cervical spondylosis as described within the body of the report. 6. Chronic, healed fracture deformities of the posterior left third and fourth ribs Electronically  Signed   By: Bascom Lily D.O.   On: 05/22/2024 12:26   CT CERVICAL SPINE WO CONTRAST Result Date: 05/22/2024 CLINICAL DATA:  Provided history: Headache, neuro deficit. Neck trauma. Additional history obtained from electronic MEDICAL RECORD NUMBERFall from bed, neck pain. EXAM: CT HEAD WITHOUT CONTRAST CT CERVICAL SPINE WITHOUT CONTRAST TECHNIQUE: Multidetector CT imaging of the head and cervical spine was performed following the standard protocol without intravenous contrast. Multiplanar CT image reconstructions of the cervical spine were also generated. RADIATION DOSE REDUCTION: This exam was performed according to the departmental dose-optimization program which includes automated exposure control, adjustment of the mA and/or kV according to patient size and/or use of iterative reconstruction technique. COMPARISON:  None. FINDINGS: CT HEAD FINDINGS Brain: Mild generalized cerebral atrophy. Patchy and ill-defined hypoattenuation within the cerebral white matter, nonspecific but compatible with mild chronic small vessel ischemic disease. There is no acute intracranial hemorrhage. No demarcated cortical infarct. No extra-axial fluid collection. No evidence of an intracranial mass. No midline shift. Vascular: No hyperdense vessel.  Atherosclerotic calcifications. Skull: No calvarial fracture or aggressive osseous lesion. Sinuses/Orbits: No mass or acute finding within the imaged orbits. No significant paranasal sinus disease. CT CERVICAL SPINE FINDINGS Alignment: Dextrocurvature of the cervical spine. Nonspecific reversal of the expected cervical lordosis. 2 mm C3-C4 grade 1 anterolisthesis. Slight C5-C6 grade 1 retrolisthesis. 2 mm C7-T1 grade 1 anterolisthesis. Slight T1-T2 grade 1 anterolisthesis. Skull base and vertebrae: The basion-dental and atlanto-dental intervals are maintained.No evidence of acute fracture to the cervical spine. Soft tissues and spinal canal: No prevertebral fluid or swelling. No visible  canal hematoma. Disc levels: Cervical spondylosis with multilevel disc space narrowing, disc bulges/central disc protrusions, endplate spurring, uncovertebral hypertrophy and facet arthropathy. Disc space narrowing is greatest at C5-C6 (advanced) and C6-C7 (moderate-to-advanced). No appreciable high-grade spinal canal stenosis. Multilevel bony neural foraminal narrowing. Degenerative changes also present at the C1-C2 articulation. Small multilevel ventral osteophytes. Upper chest: No consolidation within the imaged lung apices. No visible pneumothorax. Other: Chronic, healed fracture deformities of the posterior left third and fourth ribs. IMPRESSION: Non-contrast head CT: 1.  No evidence of an acute intracranial abnormality. 2. Parenchymal atrophy and chronic small vessel ischemic disease. CT cervical spine: 1. No evidence of an acute cervical spine fracture. 2. Nonspecific reversal of the expected cervical lordosis. 3. Dextrocurvature of the cervical spine. 4. Mild grade 1 spondylolisthesis at C3-C4, C4-C5,  C5-C6, C7-T1 and T1-T2. 5. Cervical spondylosis as described within the body of the report. 6. Chronic, healed fracture deformities of the posterior left third and fourth ribs Electronically Signed   By: Bascom Lily D.O.   On: 05/22/2024 12:26   DG Chest 1 View Result Date: 05/22/2024 CLINICAL DATA:  Fall today. EXAM: CHEST  1 VIEW COMPARISON:  October 07, 2019. FINDINGS: The heart size and mediastinal contours are within normal limits. Both lungs are clear. The visualized skeletal structures are unremarkable. IMPRESSION: No active disease. Electronically Signed   By: Rosalene Colon M.D.   On: 05/22/2024 11:36   DG Wrist Complete Right Result Date: 05/22/2024 CLINICAL DATA:  Right wrist pain after fall today. EXAM: RIGHT WRIST - COMPLETE 3+ VIEW COMPARISON:  None Available. FINDINGS: There is no evidence of fracture or dislocation. Severe degenerative changes are seen between the trapezium and scaphoid.  Mild degenerative changes are seen involving the radiocarpal and radioulnar joints. Soft tissues are unremarkable. IMPRESSION: Degenerative changes as noted above.  No acute abnormality seen. Electronically Signed   By: Rosalene Colon M.D.   On: 05/22/2024 11:35   DG Shoulder Right Result Date: 05/22/2024 CLINICAL DATA:  Right shoulder pain after fall today. EXAM: RIGHT SHOULDER - 2+ VIEW COMPARISON:  None Available. FINDINGS: There is no evidence of fracture or dislocation. Mild degenerative changes seen involving right acromioclavicular joint. Soft tissues are unremarkable. IMPRESSION: No acute abnormality seen Electronically Signed   By: Rosalene Colon M.D.   On: 05/22/2024 11:32    Procedures .Critical Care  Performed by: Guthrie Bureau, PA-C Authorized by: [place] Bureau, PA-C   Critical care provider statement:    Critical care time (minutes):  30   Critical care was necessary to treat or prevent imminent or life-threatening deterioration of the following conditions: UTI and hyperbilirubinemia.   Critical care was time spent personally by me on the following activities:  Development of treatment plan with patient or surrogate, discussions with consultants, evaluation of patient's response to treatment, examination of patient, ordering and review of laboratory studies, ordering and review of radiographic studies, ordering and performing treatments and interventions, pulse oximetry, re-evaluation of patient's condition and review of old charts Comments:     UTI and elevated liver enzymes/hyperbilirubinemia requiring hospital admission     Medications Ordered in ED Medications  cefTRIAXone (ROCEPHIN) 1 g in sodium chloride  0.9 % 100 mL IVPB (1 g Intravenous New Bag/Given 05/22/24 1511)  acetaminophen  (TYLENOL ) tablet 1,000 mg (1,000 mg Oral Given 05/22/24 1229)  ibuprofen  (ADVIL ) tablet 400 mg (400 mg Oral Given 05/22/24 1506)  fentaNYL  (SUBLIMAZE ) injection 25 mcg (25 mcg  Intravenous Given 05/22/24 1507)    ED Course/ Medical Decision Making/ A&P                                 Medical Decision Making Amount and/or Complexity of Data Reviewed Labs: ordered. Radiology: ordered.  Risk OTC drugs. Prescription drug management.   This patient presents to the ED for concern of AMS, this involves an extensive number of treatment options, and is a complaint that carries with it a high risk of complications and morbidity.  The differential diagnosis includes CVA, ICH, intracranial mass, critical dehydration, heptatic dysfunction, uremia, hypercarbia, intoxication/withdrawal, endocrine abnormality, sepsis/infection.   Co morbidities that complicate the patient evaluation  anxiety, OS, depression, HTN, hypothyroidism, aneurysm of ascending aorta   Additional history  obtained:  Dr. Bernetta Brilliant PCP   Problem List / ED Course / Critical interventions / Medication management  Admitting patient for UTI, elevated liver enzymes, and generalized weakness. Patient presented for generalized weakness, body aches, and syncopal episode. Patient with difficulty walking in ED. Rest of neuro exam is non-focal. Rest of physical exam reassuring. Patient afebrile with stable vitals. Broad workup started in ED. AST/ALT elevated at 224/201 which may be d/t vodka tonic and wine that patient was drinking last night. T.bili also elevated at 2.0 today. I added on hepatitis panel. UA concerning for infection with small leukocytes, many bacteria, 11-20 WBC.  CBC without leukocytosis or anemia.  Respiratory panel negative.  UDS negative.  CK within normal limits.  Troponin within normal limits.  EtOH within normal limits.  I personally viewed and interpreted the EKG/cardiac monitored which showed an underlying rhythm of: Sinus rhythm. I ordered imaging studies including CT head/cervical spine, chest/wrist/shoulder x-ray, MRI brain. Imaging was without acute process. I started patient on IV ABX  for UTI. I attempted to ambulate patient, but she still has generalized weakness and generalized body aches and is not able to walk with stable gait. I will admit her for monitoring and UTI and elevated liver enzymes. Patient's agrees to this plan. I have reviewed the patients home medicines and have made adjustments as needed   Social Determinants of Health:  geriatric   3:28 PM Care of Christine [name] transferred to PA [place] at the end of my shift as the patient will require reassessment once labs/imaging have resulted. Patient presentation, ED course, and plan of care discussed with review of all pertinent labs and imaging. Please see his/her note for further details regarding further ED course and disposition. Plan at time of handoff is admit patient for UTI, elevated liver enzymes, and generalized weakness. This may be altered or completely changed at the discretion of the oncoming team pending results of further workup.         Final Clinical Impression(s) / ED Diagnoses Final diagnoses:  Acute cystitis without hematuria  Elevated liver enzymes  Weakness    Rx / DC Orders ED Discharge Orders     None          [place] Bureau, [place] 05/22/24 1540    Jerilynn Montenegro, MD 05/23/24 [phone]

## 2024-05-22 NOTE — ED Notes (Signed)
 Patient transported to X-ray

## 2024-05-22 NOTE — ED Notes (Signed)
 Pt ambulated to bathroom and back to room with help of family member per request. Pt walked with slight limp that she reports is from L knee pain. No other acute issues noted while ambulating.

## 2024-05-23 ENCOUNTER — Other Ambulatory Visit (HOSPITAL_COMMUNITY)

## 2024-05-23 ENCOUNTER — Inpatient Hospital Stay (HOSPITAL_COMMUNITY)

## 2024-05-23 DIAGNOSIS — R55 Syncope and collapse: Secondary | ICD-10-CM | POA: Diagnosis not present

## 2024-05-23 LAB — ECHOCARDIOGRAM COMPLETE
AR max vel: 2.44 cm2
AV Area VTI: 2.36 cm2
AV Area mean vel: 2.3 cm2
AV Mean grad: 3 mmHg
AV Peak grad: 5.5 mmHg
Ao pk vel: 1.17 m/s
Area-P 1/2: 2.36 cm2
S' Lateral: 3.1 cm

## 2024-05-23 LAB — URINE CULTURE

## 2024-05-23 LAB — COMPREHENSIVE METABOLIC PANEL WITH GFR
ALT: 333 U/L — ABNORMAL HIGH (ref 0–44)
AST: 203 U/L — ABNORMAL HIGH (ref 15–41)
Albumin: 3.2 g/dL — ABNORMAL LOW (ref 3.5–5.0)
Alkaline Phosphatase: 59 U/L (ref 38–126)
Anion gap: 7 (ref 5–15)
BUN: 11 mg/dL (ref 8–23)
CO2: 23 mmol/L (ref 22–32)
Calcium: 8.4 mg/dL — ABNORMAL LOW (ref 8.9–10.3)
Chloride: 102 mmol/L (ref 98–111)
Creatinine, Ser: 0.65 mg/dL (ref 0.44–1.00)
GFR, Estimated: >60 mL/min (ref >60)
Glucose, Bld: 120 mg/dL — ABNORMAL HIGH (ref 70–99)
Potassium: 3.9 mmol/L (ref 3.5–5.1)
Sodium: 132 mmol/L — ABNORMAL LOW (ref 135–145)
Total Bilirubin: 1.2 mg/dL (ref 0.0–1.2)
Total Protein: 5.6 g/dL — ABNORMAL LOW (ref 6.5–8.1)

## 2024-05-23 LAB — CBC
HCT: 33.1 % — ABNORMAL LOW (ref 36.0–46.0)
HCT: 33.1 % — ABNORMAL LOW (ref 36.0–46.0)
Hemoglobin: 11.5 g/dL — ABNORMAL LOW (ref 12.0–15.0)
Hemoglobin: 11.1 g/dL — ABNORMAL LOW (ref 12.0–15.0)
MCH: 31.2 pg (ref 26.0–34.0)
MCH: 31.5 pg (ref 26.0–34.0)
MCHC: 34.7 g/dL (ref 30.0–36.0)
MCHC: 33.5 g/dL (ref 30.0–36.0)
MCV: 89.7 fL (ref 80.0–100.0)
MCV: 94 fL (ref 80.0–100.0)
Platelets: 163 10*3/uL (ref 150–400)
Platelets: 149 10*3/uL — ABNORMAL LOW (ref 150–400)
RBC: 3.69 MIL/uL — ABNORMAL LOW (ref 3.87–5.11)
RBC: 3.52 MIL/uL — ABNORMAL LOW (ref 3.87–5.11)
RDW: 13.6 % (ref 11.5–15.5)
RDW: 13.6 % (ref 11.5–15.5)
WBC: 5.1 10*3/uL (ref 4.0–10.5)
WBC: 4.8 10*3/uL (ref 4.0–10.5)
nRBC: 0 % (ref 0.0–0.2)
nRBC: 0 % (ref 0.0–0.2)

## 2024-05-23 LAB — CREATININE, SERUM
Creatinine, Ser: 0.73 mg/dL (ref 0.44–1.00)
GFR, Estimated: >60 mL/min (ref >60)

## 2024-05-23 LAB — D-DIMER, QUANTITATIVE: D-Dimer, Quant: 0.64 ug{FEU}/mL — ABNORMAL HIGH (ref 0.00–0.50)

## 2024-05-23 MED ORDER — DIPHENHYDRAMINE HCL 25 MG PO CAPS
25.0000 mg | ORAL_CAPSULE | Freq: Once | ORAL | Status: AC
  Administered 2024-05-23: 25 mg via ORAL
  Filled 2024-05-23: qty 1

## 2024-05-23 NOTE — Care Management Obs Status (Signed)
 MEDICARE OBSERVATION STATUS NOTIFICATION   Patient Details  Name: Christine [name] MRN: [id number] Date of Birth: 1946/11/06   Medicare Observation Status Notification Given:  Yes    Tom-Johnson, Angelique Ken, RN 05/23/2024, 5:35 PM

## 2024-05-23 NOTE — Evaluation (Signed)
 Physical Therapy Evaluation Patient Details Name: Christine [name] MRN: [id number] DOB: 28-Apr-1946 Today's Date: 05/23/2024  History of Present Illness  78 y/o female who presented to the ER at [facility] on 05/22/24 with suspected syncope versus fall. Was in bed with spouse and woke up on floor. Past medical history significant of essential hypertension, previous history of DVT, osteoarthritis status post left total knee replacement, anemia of chronic disease, hypothyroidism, aortic aneurysm and previous history of transaminitis  Clinical Impression  Pt is presenting below baseline level of functioning. Currently spouse is in hospital as well. Family is supportive and daughter/son in room at end of session. Pt currently is Min A for bed mobility, sit to stand and intemittent Min A for gait after training to decrease stress on L knee. Due to pt current functional status, home set up and available assistance at home recommending skilled physical therapy services > 3 hours/day in order to address strength, balance and functional mobility to decrease risk for falls, injury, immobility, skin break down and re-hospitalization.          If plan is discharge home, recommend the following: A little help with walking and/or transfers;Help with stairs or ramp for entrance;Assistance with cooking/housework;Assist for transportation     Equipment Recommendations Rolling walker (2 wheels);Other (comment);BSC/3in1 (R platform walker)  Recommendations for Other Services  Rehab consult    Functional Status Assessment Patient has had a recent decline in their functional status and demonstrates the ability to make significant improvements in function in a reasonable and predictable amount of time.     Precautions / Restrictions Precautions Precautions: Fall Restrictions Weight Bearing Restrictions Per Provider Order: No      Mobility  Bed Mobility Overal bed mobility: Needs Assistance Bed Mobility: Supine to  Sit, Sit to Supine     Supine to sit: Min assist, Used rails, HOB elevated Sit to supine: Min assist   General bed mobility comments: Min A at trunk to get to mid line, Min A at LLE to get back into bed at end of session    Transfers Overall transfer level: Needs assistance Equipment used: Right platform walker Transfers: Sit to/from Stand Sit to Stand: Min assist           General transfer comment: Min A for initial momentum to get to standing. Platform walker to decrease stress on R wrist.    Ambulation/Gait Ambulation/Gait assistance: Contact guard assist, Min assist Gait Distance (Feet): 100 Feet Assistive device: Right platform walker Gait Pattern/deviations: Step-to pattern, Decreased stance time - left, Decreased step length - right, Antalgic, Step-through pattern Gait velocity: decreased Gait velocity interpretation: <1.8 ft/sec, indicate of risk for recurrent falls   General Gait Details: low foot clearance, small step to gait pattern. Slightly improved with recriprocal gait pattern after ~75 ft of gait and improved wgt bearing on the LLE with intermittent Min A and constant CGA for safety due to pain/weakness in the LLE      Balance Overall balance assessment: Needs assistance Sitting-balance support: Single extremity supported, Feet supported Sitting balance-Leahy Scale: Good     Standing balance support: Bilateral upper extremity supported, Reliant on assistive device for balance, During functional activity Standing balance-Leahy Scale: Poor Standing balance comment: requires intermittent Min A for balance.           Pertinent Vitals/Pain Pain Assessment Pain Assessment: 0-10 Pain Score: 10-Worst pain ever Pain Location: LT knee without RW in standing. Pain Descriptors / Indicators: Pressure, Tightness Pain Intervention(s): Monitored during  session, Limited activity within patient's tolerance    Home Living Family/patient expects to be discharged  to:: Private residence Living Arrangements: Spouse/significant other (Husband is also admitted to [facility] for brain surgery per spouse.) Available Help at Discharge: Family;Available 24 hours/day (Daughter lives in town. 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     Extremity/Trunk Assessment   Upper Extremity Assessment Upper Extremity Assessment: Defer to OT evaluation RUE Deficits / Details: Shoulder ROM intact. MMT NT due to mild pain. Elbow ROM intact. RT wrist edema which pt reports has improved. Pain with initial supination then pt denied pain as she worked it out [employer]. RUE: Shoulder pain with ROM RUE Sensation: WNL    Lower Extremity Assessment Lower Extremity Assessment: LLE deficits/detail LLE: Unable to fully assess due to pain    Cervical / Trunk Assessment Cervical / Trunk Assessment: Normal  Communication   Communication Communication: No apparent difficulties    Cognition Arousal: Alert Behavior During Therapy: WFL for tasks assessed/performed   PT - Cognitive impairments: No apparent impairments       Following commands: Intact       Cueing Cueing Techniques: Verbal cues     General Comments General comments (skin integrity, edema, etc.): Edema noted at the L knee. Pt fatigeus with shortness of breathe at end of session        Assessment/Plan    PT Assessment Patient needs continued PT services  PT Problem List Decreased strength;Decreased activity tolerance;Decreased  balance;Decreased mobility;Pain       PT Treatment Interventions DME instruction;Balance training;Gait training;Stair training;Functional mobility training;Therapeutic activities;Therapeutic exercise;Patient/family education    PT Goals (Current goals can be found in the Care Plan section)  Acute Rehab PT Goals Patient Stated Goal: To return home as soon as possible and get back to PLOF PT Goal Formulation: With patient Time For Goal Achievement: 06/06/24 Potential to Achieve Goals: Good    Frequency Min 2X/week         AM-PAC PT 6 Clicks Mobility  Outcome Measure Help needed turning from your back to your side while in a flat bed without using bedrails?: A Little Help needed moving from lying on your back to sitting on the side of a flat bed without using bedrails?: A Little Help needed moving to and from a bed to a chair (including a wheelchair)?: A Little Help needed standing up from a chair using your arms (e.g., wheelchair or bedside chair)?: A Little Help needed to walk in hospital room?: A Little Help needed climbing 3-5 steps with a railing? : A Lot 6 Click Score: 17    End of Session Equipment Utilized During Treatment: Gait belt Activity Tolerance: Patient tolerated treatment well Patient left: in bed;with call bell/phone within reach;with bed alarm set;with family/visitor present Nurse Communication: Mobility status PT Visit Diagnosis: Other abnormalities of gait and mobility (R26.89);Unsteadiness on feet (R26.81);Pain Pain - Right/Left: Left Pain - part of body: Knee    Time: 1610-9604 PT Time Calculation (  min) (ACUTE ONLY): 26 min   Charges:   PT Evaluation $PT Eval Low Complexity: 1 Low PT Treatments $Gait Training: 8-22 mins PT General Charges $$ ACUTE PT VISIT: 1 Visit        Sloan Duncans, DPT, CLT  Acute Rehabilitation Services Office: [phone] (Secure chat preferred)   Jenice Mitts 05/23/2024, 11:59 AM

## 2024-05-23 NOTE — Progress Notes (Signed)
 Inpatient Rehab Admissions Coordinator:   Per therapy recommendations pt was screened for CIR by Loye Rumble, PT, DPT.  Admitted for syncopal episode during a fall from bed.  Workup negative for syncopal cause.  Pt with UTI and left knee effusion pending ortho consult.  Pt is requiring up to min assist with mobility and ADLs.  Spouse currently hospitalized as he fell when trying to get her up and has a SDH.  At this time, pt mobilizing fairly well and with limited medical complexity I do not this [facility] Medicare will approve a CIR admission for her.  We will not pursue a consult at this time.   Loye Rumble, PT, DPT Admissions Coordinator [phone] 05/23/24  12:31 PM

## 2024-05-23 NOTE — Care Management CC44 (Signed)
 Condition Code 44 Documentation Completed  Patient Details  Name: Christine [name] MRN: [id number] Date of Birth: [birth year]   Condition Code 44 given:  Yes Patient signature on Condition Code 44 notice:  Yes Documentation of 2 MD's agreement:  Yes Code 44 added to claim:  Yes    Tom-Johnson, Angelique Ken, RN 05/23/2024, 5:37 PM

## 2024-05-23 NOTE — Evaluation (Addendum)
 Occupational Therapy Evaluation Patient Details Name: Christine [name] MRN: [id number] DOB: 10/22/1946 Today's Date: 05/23/2024   History of Present Illness   78 y/o female who presented to the ER at [facility] on 05/22/24 with suspected syncope versus fall. Was in bed with spouse and woke up on floor. Past medical history significant of essential hypertension, previous history of DVT, osteoarthritis status post left total knee replacement, anemia of chronic disease, hypothyroidism, aortic aneurysm and previous history of transaminitis     Clinical Impressions Patient is currently requiring as high as CG vs Min assistance with basic ADLs, as well as  minimal assist with bed mobility and up to moderate assist with functional transfers to stand from elevated EOB and from [facility], then ambulate with increased LT knee pain without AD and increased RT wrist pain, but lower LT knee pain with use of RW.  Physical Therapy contacted for possible RT platform.   Current level of function is below patient's typical baseline.    During this evaluation, patient was limited by Pain as above, impulsivity with emergent awareness of pain and deficits, impaired use of RT dominant UE and edema to LT knee and RT wrist. all of which has the potential to impact patient's and/or caregivers' safety and independence during functional mobility, as well as performance for ADLs.    Patient lives with her spouse who  is currently hospitalized for brain surgery. Pt reports that she has a daughter able to provide 24/7 supervision and assistance.  Patient demonstrates very good rehab potential, and should benefit from continued skilled occupational therapy services while in acute care to maximize safety, independence and quality of life at home.  Patient will benefit from intensive inpatient follow-up therapy, >3 hours/day.    ?      If plan is discharge home, recommend the following:   A little help with walking and/or transfers;A  little help with bathing/dressing/bathroom;Assistance with cooking/housework     Functional Status Assessment   Patient has had a recent decline in their functional status and demonstrates the ability to make significant improvements in function in a reasonable and predictable amount of time.     Equipment Recommendations    (RW. Possible RW with RT platform. Defer to Pt.)     Recommendations for Other Services   PT consult     Precautions/Restrictions         Mobility Bed Mobility Overal bed mobility: Needs Assistance Bed Mobility: Supine to Sit     Supine to sit: Min assist, Used rails, HOB elevated     General bed mobility comments: Increased time with pt having emergent awareness of deficits as she moves.    Transfers                          Balance Overall balance assessment: Needs assistance Sitting-balance support: Single extremity supported, Feet supported Sitting balance-Leahy Scale: Good     Standing balance support: Bilateral upper extremity supported, Reliant on assistive device for balance, During functional activity Standing balance-Leahy Scale: Poor                             ADL either performed or assessed with clinical judgement   ADL Overall ADL's : Needs assistance/impaired Eating/Feeding: Set up   Grooming: Wash/dry hands;Sitting;Set up   Upper Body Bathing: Sitting;Set up   Lower Body Bathing: Minimal assistance;Sitting/lateral leans   Upper Body Dressing :  Set up;Sitting   Lower Body Dressing: Minimal assistance;Sitting/lateral leans;Sit to/from stand   Toilet Transfer: Ambulation;Rolling walker (2 wheels);Cueing for sequencing;Minimal assistance Toilet Transfer Details (indicate cue type and reason): BSC placed atop toilet. Pt had urgent need for commode and no RW in room. Pt stoodfrom Elevated EOB with Mod As. Pt provided with LT HHA and RT hand to IV and required Mod As to ambulate 10' to BR. While  in BR a RW was brought to BR and pt able to stand with cues and Mod As as pt unable to toelrate pusing up with RT hand and m,ust lean over to push with RT forearm. Pt used RW to ambulate back to bed for ECHO with Min As progressing to CGA with cues for sequence, slow, controlled movements. Pt tolerated better to LT knee but RT wrist in pain with this.  MD in room and notified. Toileting- Clothing Manipulation and Hygiene: Contact guard assist;Sitting/lateral lean;Sit to/from stand       Functional mobility during ADLs: Contact guard assist;Minimal assistance;Moderate assistance;Cueing for safety;Cueing for sequencing;Rolling walker (2 wheels)       Vision   Vision Assessment?: No apparent visual deficits     Perception         Praxis         Pertinent Vitals/Pain Pain Assessment Pain Assessment: 0-10 Pain Score: 10-Worst pain ever Pain Location: LT knee without RW in standing. RT wrist when using RW with kne much better. . Pain Descriptors / Indicators: Pressure, Tightness Pain Intervention(s): Limited activity within patient's tolerance, Monitored during session, Repositioned ([place] PT for platform to RUE for wrist pain management.)     Extremity/Trunk Assessment Upper Extremity Assessment Upper Extremity Assessment: Right hand dominant;RUE deficits/detail (LUE WFL) RUE Deficits / Details: Shoulder ROM intact. MMT NT due to mild pain. Elbow ROM intact. RT wrist edema which pt reports has improved. Pain with initial supination then pt denied pain as she worked it out [employer]. RUE: Shoulder pain with ROM RUE Sensation: WNL           Communication Communication Communication: No apparent difficulties   Cognition Arousal: Alert Behavior During Therapy: Impulsive Cognition: No family/caregiver present to determine baseline             OT - Cognition Comments: Emergent awareness of deficits. Pt tries to downplay and concerned in terms of safety dueto this.  Otherwise, cogniton intact. Does require redirection.                 Following commands: Intact       Cueing  General Comments   Cueing Techniques: Verbal cues      Exercises Other Exercises Other Exercises: Pt cautioned to allow RT wrist to rest and pay attention to pain to know when to stop.   Shoulder Instructions      Home Living Family/patient expects to be discharged to:: Private residence Living Arrangements: Spouse/significant other (Husband is also admitted to [facility] for brain [facility] per spouse.) Available Help at Discharge: Family;Available 24 hours/day (Daughter lives in town. 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    OT Problem List: Decreased strength;Decreased knowledge of use of DME or AE;Increased edema;Decreased range of motion;Cardiopulmonary status limiting activity;Impaired UE functional use;Pain;Impaired balance (sitting and/or standing)   OT Treatment/Interventions: Self-care/ADL training;Balance training;Therapeutic exercise;Therapeutic activities;DME and/or AE instruction;Patient/family education      OT Goals(Current goals can be found in the care plan section)   Acute Rehab OT Goals Patient Stated Goal: Go home. I'll be fine. OT Goal Formulation: With patient Time For Goal Achievement: 06/06/24 Potential to Achieve Goals: Good ADL  Goals Pt Will Perform Grooming: standing;with modified independence Pt Will Perform Lower Body Dressing: with modified independence;sitting/lateral leans;sit to/from stand Pt Will Transfer to Toilet: ambulating;with modified independence Pt Will Perform Toileting - Clothing Manipulation and hygiene: with modified independence Pt/caregiver will Perform Home Exercise Program: Increased ROM (TBD: Hospitralist in room mentioned Ortho may be consulted with new information from pt mobilizing with OT.) Additional ADL Goal #1: Patient will identify at least 3 fall prevention strategies to employ at home in order to maximize function and safety during ADLs and decrease caregiver burden while preventing possible injury and rehospitalization.   OT Frequency:  Min 3X/week    Co-evaluation              AM-PAC OT 6 Clicks Daily Activity     Outcome Measure Help from another person eating meals?: None Help from another person taking care of personal grooming?: A Little Help from another person toileting, which includes using toliet, bedpan, or urinal?: A Little Help from another person bathing (including washing, rinsing, drying)?: A Little Help from another person to put on and taking off regular upper body clothing?: A Little Help from another person to put on and taking off regular lower body clothing?: A Little 6 Click Score: 19   End of Session Equipment Utilized During Treatment: Gait belt;Rolling walker (2 wheels) Nurse Communication: Other (comment) (MD in room and briefed on pt's response to mobility.)  Activity Tolerance: Patient limited by pain Patient left: in bed  OT Visit Diagnosis: Pain;Unsteadiness on feet (R26.81);Dizziness and giddiness (R42) Pain - Right/Left: Left Pain - part of body: Knee (and RT wrist)                Time: 1191-4782 OT Time Calculation (min): 41 min Charges:  OT General Charges $OT Visit: 1 Visit OT Evaluation $OT Eval Low Complexity: 1 Low OT  Treatments $Self Care/Home Management : 8-22 mins $Therapeutic Activity: 8-22 mins  [facility], OT Acute Rehab Services Office: [phone] 05/23/2024   Asher Blade 05/23/2024, 11:34 AM

## 2024-05-23 NOTE — Progress Notes (Signed)
 Progress Note   Patient: Christine [name] UJW:[id number] DOB: 1946-03-17 DOA: 05/22/2024     1 DOS: the patient was seen and examined on 05/23/2024   Brief hospital course: AMARIANNA ABPLANALP is a 78 y.o. female with medical history significant of essential hypertension, previous history of DVT, osteoarthritis status post left total knee replacement, anemia of chronic disease, hypothyroidism, aortic aneurysm and previous history of transaminitis who presented to the ER with suspected syncope versus fall.  Patient has no recollection of what happened.  She apparently was sleeping in the bed with the husband also in the room when she suddenly found herself on the floor.  Not sure of how she got there.  Patient however is having pain all over.  It is worse on the left knee where she had previous total knee replacement.  She also has pain in her bilateral wrists.  In the ER workup was performed for possible CVA.  Head CT without contrast and MRI of the brain were performed both negative.  All other x-rays were negative but urinalysis was consistent with UTI.  Initiated on IV Rocephin and being admitted to the medical service for treatment.   Assessment and Plan: Syncopal episode  Unclear etiology. It is possible she had a vasovagal episode in the setting of recently restarting her valsartan . 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 Moreover, she had recently gone for a walk prior to this episode. All of which could have contributed to a vasovagal episode.  Also considered PE given her h/o DVT (though this was provoked in the setting of joint replacement).   Low suspicion for arrhythmia or seizure activity leading to syncopal event.    Moreover, recent TTE with mild to moderate AR and no AS.  Troponin negative x2    Plan:  Check orthostatic vital signs  Check d dimer>>possible CTA chest to r/o PE  F/u repeat TTE  Monitor  telemetry IV fluids    L knee effusion  Low suspicion for infection.  Will reach out to orthopedic surgery.   Possible UTI Continue ceftriaxone   Mild normocytic anemia  Check iron panel.    Transaminitis  Has had elevated transaminases in the past. Most recently had normal LFTs about a month ago except for mildly elevated t bili. Her acute hepatitis panel is negative. The pattern is hepatocellular in nature.       Latest Ref Rng & Units 05/23/2024    7:08 AM 05/22/2024   12:35 PM 04/08/2024    1:45 PM  Hepatic Function  Total Protein 6.5 - 8.1 g/dL 5.6  6.4  7.0   Albumin 3.5 - 5.0 g/dL 3.2  3.9  4.5   AST 15 - 41 U/L 203  224  22   ALT 0 - 44 U/L 333  201  20   Alk Phosphatase 38 - 126 U/L 59  55  65   Total Bilirubin 0.0 - 1.2 mg/dL 1.2  2.0  1.7    She has a history of cold agglutinin disease. But bilirubin is normal this AM.   Will check RUQ US .  LDH Bili fractionation    Depression with anxiety  Continue home medicaiton   HTN Continue home medications  HLD Hold crestor given elevated transaminases   Hypothyroidsim  Continue home levothyroxine        Subjective: Patient states that she was sitting in her bed after coming back from  a walk when she started to have pain in her arms and throughout the rest of the body. At that time she was concerned she might be having a heart attack and she slid off of her bed intentionally to try and get her husband in case she needed to go to the ER. When she stood up, she got dizzy and nauseous and passed out. She does not remember the actual fall. When she regained consciousness she noticed that she was again hurting and she had to crawl to make it to her husbands room. (He unfortunately did not have his hearing aids in and could not hear her asking for help).   She denies any palpitations or diaphoresis. She denies tongue biting or fecal/urinary incontinence.   Of note, patient states that she has had a DVT in the past.  She is not currently on anticoagulation. She also notes that she chronically has vertigo that is positional.   Patient also states that she was started on a new antihypertensive about 3 months ago. She stopped taking the medication after about a month of use but resumed taking this around 2.5 weeks ago.   She denies urinary frequency or urgency, burning with urination, or suprapubic pain.    Physical Exam: Vitals:   05/22/24 2200 05/22/24 2301 05/23/24 0445 05/23/24 0757  BP:  129/89 130/66 120/61  Pulse: 79 77 76 79  Resp: 16 18  19   Temp:  98.4 F (36.9 C) 98.1 F (36.7 C) 98.3 F (36.8 C)  TempSrc:  Oral Oral   SpO2: 98% 98% 99% 96%   Physical Exam  Constitutional: In mild discomfort   Cardiovascular: Normal rate, regular rhythm. No lower extremity edema  Pulmonary: Non labored breathing on room air, no wheezing or rales.   Abdominal: Soft. Normal bowel sounds. Non distended and non tender Musculoskeletal: Normal range of motion.     Neurological: Alert and oriented to person, place, and time. Non focal  Skin: Skin is warm and dry.   Data Reviewed:     Latest Ref Rng & Units 05/23/2024    7:08 AM 05/22/2024   11:39 PM 05/22/2024   12:35 PM  BMP  Glucose 70 - 99 mg/dL 098   119   BUN 8 - 23 mg/dL 11   14   Creatinine 1.47 - 1.00 mg/dL 8.29  5.62  1.30   Sodium 135 - 145 mmol/L 132   131   Potassium 3.5 - 5.1 mmol/L 3.9   4.1   Chloride 98 - 111 mmol/L 102   98   CO2 22 - 32 mmol/L 23   21   Calcium 8.9 - 10.3 mg/dL 8.4   9.0       Latest Ref Rng & Units 05/23/2024    7:08 AM 05/22/2024   11:39 PM 05/22/2024   11:38 AM  CBC  WBC 4.0 - 10.5 K/uL 4.8  5.1  9.3   Hemoglobin 12.0 - 15.0 g/dL 86.5  78.4  69.6   Hematocrit 36.0 - 46.0 % 33.1  33.1  34.8   Platelets 150 - 400 K/uL 149  163  173      Family Communication: Discussed at length with patient. She   Disposition: Status is: Obs Syncopal work up will be completed in the AM   Planned Discharge Destination:  Home with Home Health    Time spent: 35 minutes  Author: Joette Mustard, MD 05/23/2024 9:52 AM  For on call review [email or web address].

## 2024-05-23 NOTE — TOC CM/SW Note (Signed)
 Transition of Care [facility]) - Inpatient Brief Assessment   Patient Details  Name: Christine [name] MRN: [id number] Date of Birth: 23-Oct-1946  Transition of Care [facility]) CM/SW Contact:    Tom-Johnson, Infantof Villagomez Daphne, RN Phone Number: 05/23/2024, 5:48 PM   Clinical Narrative:  Patient presented to the ED after she found herself on the floor at home. Patient c/o pain to Lt Knee and bilat Wrists.  Workup CT and MRI negative for CVA. Urinalysis showed positive for UTI. Started on IV abx.   From home with husband who is currently admitted in the Hospital with SDH as he fell when trying to assist patient off the floor. Has two supportive children. Self employed, independent with her care and drive self. Has a walker, shower seat and handicapped bathroom at home.   PCP is Jeannine Milroy, and uses [facility].  CIR recommended, will not pursue admit at this time.  CM will continue to follow as patient progresses with care towards discharge.        Transition of Care Asessment: Insurance and Status: Insurance coverage has been reviewed Patient has primary care physician: Yes Home environment has been reviewed: Yes Prior level of function:: Independent Prior/Current Home Services: No current home services Social Drivers of Health Review: SDOH reviewed no interventions necessary Readmission risk has been reviewed: Yes Transition of care needs: transition of care needs identified, TOC will continue to follow

## 2024-05-24 ENCOUNTER — Observation Stay (HOSPITAL_COMMUNITY)

## 2024-05-24 DIAGNOSIS — R55 Syncope and collapse: Secondary | ICD-10-CM | POA: Diagnosis not present

## 2024-05-24 LAB — COMPREHENSIVE METABOLIC PANEL WITH GFR
ALT: 229 U/L — ABNORMAL HIGH (ref 0–44)
AST: 91 U/L — ABNORMAL HIGH (ref 15–41)
Albumin: 3.1 g/dL — ABNORMAL LOW (ref 3.5–5.0)
Alkaline Phosphatase: 67 U/L (ref 38–126)
Anion gap: 8 (ref 5–15)
BUN: 9 mg/dL (ref 8–23)
CO2: 22 mmol/L (ref 22–32)
Calcium: 8.7 mg/dL — ABNORMAL LOW (ref 8.9–10.3)
Chloride: 104 mmol/L (ref 98–111)
Creatinine, Ser: 0.67 mg/dL (ref 0.44–1.00)
GFR, Estimated: >60 mL/min (ref >60)
Glucose, Bld: 106 mg/dL — ABNORMAL HIGH (ref 70–99)
Potassium: 4.2 mmol/L (ref 3.5–5.1)
Sodium: 134 mmol/L — ABNORMAL LOW (ref 135–145)
Total Bilirubin: 0.7 mg/dL (ref 0.0–1.2)
Total Protein: 5.5 g/dL — ABNORMAL LOW (ref 6.5–8.1)

## 2024-05-24 LAB — CBC WITH DIFFERENTIAL/PLATELET
Abs Immature Granulocytes: 0.01 10*3/uL (ref 0.00–0.07)
Basophils Absolute: 0 10*3/uL (ref 0.0–0.1)
Basophils Relative: 1 %
Eosinophils Absolute: 0.1 10*3/uL (ref 0.0–0.5)
Eosinophils Relative: 3 %
HCT: 32.5 % — ABNORMAL LOW (ref 36.0–46.0)
Hemoglobin: 10.9 g/dL — ABNORMAL LOW (ref 12.0–15.0)
Immature Granulocytes: 0 %
Lymphocytes Relative: 16 %
Lymphs Abs: 0.6 10*3/uL — ABNORMAL LOW (ref 0.7–4.0)
MCH: 31.7 pg (ref 26.0–34.0)
MCHC: 33.5 g/dL (ref 30.0–36.0)
MCV: 94.5 fL (ref 80.0–100.0)
Monocytes Absolute: 0.4 10*3/uL (ref 0.1–1.0)
Monocytes Relative: 11 %
Neutro Abs: 2.7 10*3/uL (ref 1.7–7.7)
Neutrophils Relative %: 69 %
Platelets: 141 10*3/uL — ABNORMAL LOW (ref 150–400)
RBC: 3.44 MIL/uL — ABNORMAL LOW (ref 3.87–5.11)
RDW: 13.4 % (ref 11.5–15.5)
WBC: 3.9 10*3/uL — ABNORMAL LOW (ref 4.0–10.5)
nRBC: 0 % (ref 0.0–0.2)

## 2024-05-24 LAB — TSH: TSH: 2.352 u[IU]/mL (ref 0.350–4.500)

## 2024-05-24 LAB — LACTATE DEHYDROGENASE: LDH: 137 U/L (ref 98–192)

## 2024-05-24 LAB — IRON AND TIBC
Iron: 34 ug/dL (ref 28–170)
Saturation Ratios: 15 % (ref 10.4–31.8)
TIBC: 221 ug/dL — ABNORMAL LOW (ref 250–450)
UIBC: 187 ug/dL

## 2024-05-24 LAB — BILIRUBIN, FRACTIONATED(TOT/DIR/INDIR)
Bilirubin, Direct: 0.2 mg/dL (ref 0.0–0.2)
Indirect Bilirubin: 0.6 mg/dL (ref 0.3–0.9)
Total Bilirubin: 0.8 mg/dL (ref 0.0–1.2)

## 2024-05-24 LAB — FERRITIN: Ferritin: 842 ng/mL — ABNORMAL HIGH (ref 11–307)

## 2024-05-24 MED ORDER — IOHEXOL 350 MG/ML SOLN
58.0000 mL | Freq: Once | INTRAVENOUS | Status: AC | PRN
  Administered 2024-05-24: 58 mL via INTRAVENOUS

## 2024-05-24 MED ORDER — ZOLPIDEM TARTRATE 10 MG PO TABS: 5.0000 mg | ORAL_TABLET | Freq: Every day | ORAL | Status: AC

## 2024-05-24 NOTE — Progress Notes (Signed)
    Durable Medical Equipment  (From admission, onward)           Start     Ordered   05/24/24 1533  For home use only DME Bedside commode  Once       Comments: Patient is confined to one room, has Generalized Weakness and Decreased Activity Tolerance which necessitate recommendation for bedside commode.  Question:  Patient needs a bedside commode to treat with the following condition  Answer:  Weakness   05/24/24 1533

## 2024-05-24 NOTE — Progress Notes (Signed)
 Physical Therapy Treatment Patient Details Name: Christine [name] MRN: [id number] DOB: 05-12-1946 Today's Date: 05/24/2024   History of Present Illness 78 y/o female who presented to the ER at [facility] on 05/22/24 with suspected syncope versus fall. Was in bed with spouse and woke up on floor. Past medical history significant of essential hypertension, previous history of DVT, osteoarthritis status post left total knee replacement, anemia of chronic disease, hypothyroidism, aortic aneurysm and previous history of transaminitis    PT Comments  Continuing work on functional mobility and activity tolerance;  Very nice progress with activity tolerance; Far less pain R wrist, and pt did not need R platform; During walk, pt opted to walk without an assistive device, and overall managed well, using the hallway rail as needed; Able to ascend and descend stairs with contact guard assist; no dizziness throughout session; consider bringing a cane next session    If plan is discharge home, recommend the following: Assistance with cooking/housework   Can travel by private vehicle        Equipment Recommendations  BSC/3in1 (has RW)    Recommendations for Other Services       Precautions / Restrictions Precautions Precautions: Fall Recall of Precautions/Restrictions: Intact Precaution/Restrictions Comments: Fall risk is present; Can be minimzed with use of assistive device (RW) if pt chooses to use it     Mobility  Bed Mobility               General bed mobility comments: in recliner upon arrival    Transfers Overall transfer level: Needs assistance Equipment used: None Transfers: Sit to/from Stand Sit to Stand: Supervision           General transfer comment: Slow rise; dependent on UEs pushing up on armrests; no physical assist needed    Ambulation/Gait Ambulation/Gait assistance: Contact guard assist (often without the need for physical contact) Gait Distance (Feet): 200  Feet Assistive device: None (and occasional use of hallway rail) Gait Pattern/deviations: Step-to pattern, Decreased stance time - left, Decreased step length - right, Antalgic, Step-through pattern       General Gait Details: Still with soreness LLE; Tells me teh knee is sore/more painful when she puts pressure on the LLE in stance; offered RW to use to unweigh LLE in stance, and she politely declines using one this session, but states she will use hers at home when she needs to   Stairs Stairs: Yes Stairs assistance: Contact guard assist Stair Management: One rail Right, Forwards, Step to pattern, Alternating pattern (occasionally alternating pattern) Number of Stairs: 10 General stair comments: Pt takes stairs slowly and safely; she is very specific about whatching where she steps; tried a few steps alternating, and was a bit less steady, but using the rail well   Wheelchair Mobility     Tilt Bed    Modified Rankin (Stroke Patients Only)       Balance     Sitting balance-Leahy Scale: Good       Standing balance-Leahy Scale: Fair                              Hotel manager: No apparent difficulties  Cognition Arousal: Alert Behavior During Therapy: WFL for tasks assessed/performed   PT - Cognitive impairments: No apparent impairments                         Following commands: Intact  Cueing Cueing Techniques: Verbal cues  Exercises      General Comments General comments (skin integrity, edema, etc.): No dizziness reported   05/24/24 0910  Orthostatic Standing at 3 minutes  BP- Standing at 3 minutes (!) 151/91 (MAP 109)  Pulse- Standing at 3 minutes 80 (post amb in hallways, adn negotiationg a flight of steps)         Pertinent Vitals/Pain Pain Assessment Pain Assessment: Faces Faces Pain Scale: Hurts little more Pain Location: LT knee without RW in standing. Pain Descriptors / Indicators:  Pressure, Tightness Pain Intervention(s): Monitored during session, Other (comment) (Offered to use the RW, however pt declined)    Home Living                          Prior Function            PT Goals (current goals can now be found in the care plan section) Acute Rehab PT Goals Patient Stated Goal: To return home as soon as possible and get back to PLOF PT Goal Formulation: With patient Time For Goal Achievement: 06/06/24 Potential to Achieve Goals: Good Progress towards PT goals: Progressing toward goals    Frequency    Min 2X/week      PT Plan      Co-evaluation              AM-PAC PT 6 Clicks Mobility   Outcome Measure  Help needed turning from your back to your side while in a flat bed without using bedrails?: None Help needed moving from lying on your back to sitting on the side of a flat bed without using bedrails?: None Help needed moving to and from a bed to a chair (including a wheelchair)?: None Help needed standing up from a chair using your arms (e.g., wheelchair or bedside chair)?: None Help needed to walk in hospital room?: None Help needed climbing 3-5 steps with a railing? : A Little 6 Click Score: 23    End of Session Equipment Utilized During Treatment: Gait belt Activity Tolerance: Patient tolerated treatment well Patient left: in chair;with call bell/phone within reach Nurse Communication: Mobility status PT Visit Diagnosis: Other abnormalities of gait and mobility (R26.89);Unsteadiness on feet (R26.81);Pain Pain - Right/Left: Left Pain - part of body: Knee     Time: 0905-0921 PT Time Calculation (min) (ACUTE ONLY): 16 min  Charges:    $Gait Training: 8-22 mins PT General Charges $$ ACUTE PT VISIT: 1 Visit                     Darcus Eastern, PT  Acute Rehabilitation Services Office [phone] Secure Chat welcomed    Marcial Setting 05/24/2024, 11:48 AM

## 2024-05-24 NOTE — Discharge Instructions (Signed)
 Please follow up with your PCP to have your liver tests checked again.

## 2024-05-24 NOTE — Progress Notes (Incomplete)
 Progress Note   Patient: Christine [name] ZOX:[id number] DOB: 1946/09/06 DOA: 05/22/2024     1 DOS: the patient was seen and examined on 05/24/2024   Brief hospital course: AYESHA MARKWELL is a 78 y.o. female with medical history significant of essential hypertension, previous history of DVT, osteoarthritis status post left total knee replacement, anemia of chronic disease, hypothyroidism, aortic aneurysm and previous history of transaminitis who presented to the ER with suspected syncope versus fall.  Patient has no recollection of what happened.  She apparently was sleeping in the bed with the husband also in the room when she suddenly found herself on the floor.  Not sure of how she got there.  Patient however is having pain all over.  It is worse on the left knee where she had previous total knee replacement.  She also has pain in her bilateral wrists.  In the ER workup was performed for possible CVA.  Head CT without contrast and MRI of the brain were performed both negative.  All other x-rays were negative but urinalysis was consistent with UTI.  Initiated on IV Rocephin  and being admitted to the medical service for treatment.   Assessment and Plan: Syncopal episode  Unclear etiology. It is possible she had a vasovagal episode in the setting of recently restarting her valsartan . 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 Moreover, she had recently gone for a walk prior to this episode. All of which could have contributed to a vasovagal episode.  Also considered PE given her h/o DVT (though this was provoked in the setting of joint replacement).   Low suspicion for arrhythmia or seizure activity leading to syncopal event.    Moreover, recent TTE with mild to moderate AR and no AS.  Troponin negative x2    Plan:  Check orthostatic vital signs  Check d dimer>>possible CTA chest to r/o PE  F/u repeat TTE  Monitor  telemetry IV fluids    L knee effusion  Low suspicion for infection.  Will reach out to orthopedic surgery.   Possible UTI Continue ceftriaxone    Mild normocytic anemia  Check iron panel.    Transaminitis  Has had elevated transaminases in the past. Most recently had normal LFTs about a month ago except for mildly elevated t bili. Her acute hepatitis panel is negative. The pattern is hepatocellular in nature.       Latest Ref Rng & Units 05/24/2024    5:48 AM 05/24/2024    5:47 AM 05/23/2024    7:08 AM  Hepatic Function  Total Protein 6.5 - 8.1 g/dL  5.5  5.6   Albumin 3.5 - 5.0 g/dL  3.1  3.2   AST 15 - 41 U/L  91  203   ALT 0 - 44 U/L  229  333   Alk Phosphatase 38 - 126 U/L  67  59   Total Bilirubin 0.0 - 1.2 mg/dL 0.8  0.7  1.2   Bilirubin, Direct 0.0 - 0.2 mg/dL 0.2      She has a history of cold agglutinin disease. But bilirubin is normal this AM.   Will check RUQ US .  LDH Bili fractionation    Depression with anxiety  Continue home medicaiton   HTN Continue home medications  HLD Hold crestor  given elevated transaminases   Hypothyroidsim  Continue home levothyroxine      {Tip this will not be part of the note  when signed There is no height or weight on file to calculate BMI. , ,  (Optional):26781}  Subjective: Patient states that she was sitting in her bed after coming back from a walk when she started to have pain in her arms and throughout the rest of the body. At that time she was concerned she might be having a heart attack and she slid off of her bed intentionally to try and get her husband in case she needed to go to the ER. When she stood up, she got dizzy and nauseous and passed out. She does not remember the actual fall. When she regained consciousness she noticed that she was again hurting and she had to crawl to make it to her husbands room. (He unfortunately did not have his hearing aids in and could not hear her asking for help).   She denies  any palpitations or diaphoresis. She denies tongue biting or fecal/urinary incontinence.   Of note, patient states that she has had a DVT in the past. She is not currently on anticoagulation. She also notes that she chronically has vertigo that is positional.   Patient also states that she was started on a new antihypertensive about 3 months ago. She stopped taking the medication after about a month of use but resumed taking this around 2.5 weeks ago.   She denies urinary frequency or urgency, burning with urination, or suprapubic pain.   Pain is improved in knee and wrist.    Physical Exam: Vitals:   05/23/24 1449 05/23/24 1718 05/23/24 2112 05/24/24 0518  BP: 121/74 (!) 142/87 137/68 (!) 150/76  Pulse: 77 66 84 71  Resp:  19 18 18   Temp:  98.3 F (36.8 C) 98.3 F (36.8 C) 98.3 F (36.8 C)  TempSrc:  Oral    SpO2:  100% 98% 98%   Physical Exam  Constitutional: In mild discomfort   Cardiovascular: Normal rate, regular rhythm. No lower extremity edema  Pulmonary: Non labored breathing on room air, no wheezing or rales.   Abdominal: Soft. Normal bowel sounds. Non distended and non tender Musculoskeletal: Normal range of motion.     Neurological: Alert and oriented to person, place, and time. Non focal  Skin: Skin is warm and dry.   Data Reviewed:     Latest Ref Rng & Units 05/24/2024    5:47 AM 05/23/2024    7:08 AM 05/22/2024   11:39 PM  BMP  Glucose 70 - 99 mg/dL 161  096    BUN 8 - 23 mg/dL 9  11    Creatinine 0.45 - 1.00 mg/dL 4.09  8.11  9.14   Sodium 135 - 145 mmol/L 134  132    Potassium 3.5 - 5.1 mmol/L 4.2  3.9    Chloride 98 - 111 mmol/L 104  102    CO2 22 - 32 mmol/L 22  23    Calcium  8.9 - 10.3 mg/dL 8.7  8.4        Latest Ref Rng & Units 05/24/2024    5:47 AM 05/23/2024    7:08 AM 05/22/2024   11:39 PM  CBC  WBC 4.0 - 10.5 K/uL 3.9  4.8  5.1   Hemoglobin 12.0 - 15.0 g/dL 78.2  95.6  21.3   Hematocrit 36.0 - 46.0 % 32.5  33.1  33.1   Platelets 150 - 400  K/uL 141  149  163      Family Communication: Discussed at length with patient. She   Disposition:  Status is: Obs Syncopal work up will be completed in the AM   Planned Discharge Destination: Home with Home Health {Tip this will not be part of the note when signed  DVT Prophylaxis  ., Enoxaparin  (lovenox ) injection 40 mg  (Optional):26781}   Time spent: 35 minutes  Author: Joette Mustard, MD 05/24/2024 11:03 AM  For on call review [email or web address].

## 2024-05-24 NOTE — Progress Notes (Signed)
 Mobility Specialist Progress Note:    05/24/24 1000  Mobility  Activity Ambulated independently in hallway  Level of Assistance Standby assist, set-up cues, supervision of patient - no hands on  Assistive Device None  Distance Ambulated (ft) 250 ft  Activity Response Tolerated well  Mobility Referral Yes  Mobility visit 1 Mobility  Mobility Specialist Start Time (ACUTE ONLY) 0848  Mobility Specialist Stop Time (ACUTE ONLY) 0858  Mobility Specialist Time Calculation (min) (ACUTE ONLY) 10 min   Received pt in chair having no complaints and agreeable to mobility. Pt was asymptomatic throughout ambulation and returned to room w/o fault. Left in chair w/ call bell in reach and all needs met.   D'Vante Nolon Baxter Mobility Specialist Please contact via Special educational needs teacher or Rehab office at [phone]

## 2024-05-27 NOTE — Discharge Summary (Signed)
 Physician Discharge Summary   Patient: Christine [name] MRN: [id number] DOB: [birth year]  Admit date:     05/22/2024  Discharge date: 05/24/2024  Discharge Physician: Joette Mustard   PCP: Jeannine Milroy., MD   Recommendations at discharge:    F/u with pcp on when to resume ambien  and statin  Repeat LFTs to ensure normalizing.  F/u with PCP to start oral iron supplementation/ further work up for mild iron deficiency anemia   Discharge Diagnoses: Principal Problem:   Syncope and collapse Active Problems:   Anemia   ANXIETY DEPRESSION   Essential hypertension   Hypothyroidism   Transaminitis  Resolved Problems:   * No resolved hospital problems. *  Hospital Course: Christine [name] is a 78 y.o. female with medical history significant of essential hypertension, previous history of DVT, osteoarthritis status post left total knee replacement, anemia of chronic disease, hypothyroidism, aortic aneurysm and previous history of transaminitis who presented to the ER with suspected syncope versus fall.     Patient states that she had recently come back from a walk and was sitting in bed reading a book when she developed pain throughout her body that was more prominent in her upper extremities, weakness, and paresthesias (numbness and tingling). She was concerned she could be having a heart attack and slid off of her bed intentionally (due to weakness) to get her husbands attention as he did not have his hearing aids in and could not here her asking for help. When she attempted to stand up, she got dizzy and nauseous and when she came to she was on the floor. She does not remember her actual fall. She denies any tongue biting, fecal/bowel incontinence.   She crawled to the room her husband was in and was eventually able to call her daughter for further assistance. She had some pain in her wrist and L knee but her other symptoms eventually resolved. She spoke with her PCP and was instructed  to present to the ED for further evaluation.   Of note, patient drinks one mixed drink and a glass of wine daily but denies increased alcohol intake. She has chronic positional vertigo and is on meclizine  for this.  She also takes an ambien  at bedtime for sleep. She recently resumed an antihypertensive about 2 to 3 weeks ago.   She denies urinary frequency or urgency, burning with urination, or suprapubic pain, palpitations, or diaphoresis.   In the ER workup was performed for possible CVA.  Head CT without contrast and MRI of the brain were performed both negative.  All other x-rays were negative but urinalysis was consistent with UTI.  Initiated on IV Rocephin  and being admitted to the medical service for treatment.    Assessment and Plan: Syncopal episode  Unclear etiology.   TTE with known mild to moderate AR and no AS and no other structural abnormalities, making structural cardiac cause unlikely. She underwent a CTA chest which showed no evidence of PE. (This was done because patient has a h/o DVT, though provoked in the setting of recent orthopedic procedure and her d-dimer was elevated though borderline when corrected for her age).   She had no evidence of ACS. Troponin negative x2 and EKG without evidence of acute ischemia.   EKG without evidence of arrhythmia or underlying conduction disease as possible cause of syncope.   Considered orthostatic hypotension. Patient states that she is very busy managing a clothing boutique and often forgets to eat and drink. In addition,  she is on hydrochlorothiazide  and recently re initiated her valsartan . She also states that she had recently returned from a walk and consumed two alcoholic beverages prior to her syncopal episode. Her orthostatic vital signs were negative her though these were taken after IV fluids.   Possible vasovagal syncope patient states that she was in a significant amount of pain which could have served as trigger to syncopal  episode.   Plan:  Encourage continued adequate hydration  Discussed limiting alcohol intake She will follow up with her PCP   Transient diffuse pain  Acute weakness Transient Diffuse paresthesias Regarding patients initial symptoms that prompted her to seek assistance from her husband. The etiology is unclear. The diffuse nature of her symptoms makes TIA unlikely. Her MRI brain was negative for acute CVA. She has chronic cervical spine disease but neurosurgery was contacted this hospitalization and did not feel her acute symptoms would be caused by her chronic cervical spine disease.   Patient is on ambien  which could lead to her symptoms.   Plan:  Patient will discuss with PCP about weaning ambien .  Will need further work up if symptoms reoccur.      L knee effusion  Low suspicion for infection. Likely traumatic.  Orthopedic surgery contacted. Conservative management, given possibility for bleeding.     Possible UTI Patient without symptoms. S/p 3 doses of ceftriaxone .  UA with ESBL e coli. Did not start carbapenem or zosyn given asymptomatic    Mild normocytic anemia  Mild thrombocytopenia  Iron/TIBC/Ferritin/ %Sat    Component Value Date/Time   IRON 34 05/24/2024 0547   IRON 160 (H) 09/29/2016 1154   TIBC 221 (L) 05/24/2024 0547   TIBC 246 09/29/2016 1154   FERRITIN 842 (H) 05/24/2024 0547   FERRITIN 299 (H) 09/29/2016 1154   IRONPCTSAT 15 05/24/2024 0547   IRONPCTSAT 65 (H) 09/29/2016 1154       Latest Ref Rng & Units 05/24/2024    5:47 AM 05/23/2024    7:08 AM 05/22/2024   11:39 PM  CBC  WBC 4.0 - 10.5 K/uL 3.9  4.8  5.1   Hemoglobin 12.0 - 15.0 g/dL 84.6  96.2  95.2   Hematocrit 36.0 - 46.0 % 32.5  33.1  33.1   Platelets 150 - 400 K/uL 141  149  163   Will need to start oral iron supplementation with PCP.  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 Further work up per PCP.      Transaminitis  Has had elevated  transaminases in the past. Most recently had normal LFTs about a month ago except for mildly elevated t bili (has cold agglutinin).   Her acute hepatitis panel is negative. The pattern is hepatocellular in nature. RUQ US  was negative for pathology to suggest etiology of lab abnormality. These numbers did improve by discharge. She will need continued monitoring with PCP.       Latest Ref Rng & Units 05/24/2024    5:48 AM 05/24/2024    5:47 AM 05/23/2024    7:08 AM  Hepatic Function  Total Protein 6.5 - 8.1 g/dL  5.5  5.6   Albumin 3.5 - 5.0 g/dL  3.1  3.2   AST 15 - 41 U/L  91  203   ALT 0 - 44 U/L  229  333   Alk Phosphatase 38 - 126 U/L  67  59   Total Bilirubin 0.0 - 1.2 mg/dL 0.8  0.7  1.2   Bilirubin, Direct 0.0 - 0.2 mg/dL 0.2     T bili 2 on admission and mildly elevated.    Plan:  Hold statin Discussed limiting alcohol consumption.       Depression with anxiety  Continued home medicaitons   HTN Continued home medications   HLD Hold crestor  given elevated transaminases. Resume when able.    Hypothyroidsim  Continued home levothyroxine        Pain control - [place]  Controlled Substance Reporting System database was reviewed. and patient was instructed, not to drive, operate heavy machinery, perform activities at heights, swimming or participation in water activities or provide baby-sitting services while on Pain, Sleep and Anxiety Medications; until their outpatient Physician has advised to do so again. Also recommended to not to take more than prescribed Pain, Sleep and Anxiety Medications.  Consultants: none Procedures performed: none  Disposition: Home health Diet recommendation:  Regular diet DISCHARGE MEDICATION: Allergies as of 05/24/2024       Reactions   Bee Venom Anaphylaxis, Shortness Of Breath   Losartan Potassium Other (See Comments)   Other Other (See Comments)   Varenicline Other (See Comments)        Medication List     PAUSE taking  these medications    rosuvastatin  20 MG tablet Wait to take this until your doctor or other care provider tells you to start again. Commonly known as: CRESTOR  Take 20 mg by mouth daily.       TAKE these medications    acetaminophen  500 MG tablet Commonly known as: TYLENOL  Take 500-1,000 mg by mouth every 6 (six) hours as needed for mild pain or headache.   amoxicillin 500 MG tablet Commonly known as: AMOXIL Take 500 mg by mouth. Before dental work   aspirin  EC 81 MG tablet Take 81 mg by mouth daily. Swallow whole.   buPROPion  300 MG 24 hr tablet Commonly known as: WELLBUTRIN  XL Take 300 mg by mouth every morning.   cetirizine 5 MG tablet Commonly known as: ZYRTEC Take 5 mg by mouth daily.   clobetasol  0.05 % external solution Commonly known as: TEMOVATE  Apply 1 Application topically daily as needed (For dryness).   EpiPen 2-Pak 0.3 MG/0.3ML Soaj injection Generic drug: EPINEPHrine Inject 0.3 mg into the muscle once as needed for anaphylaxis.   folic acid  1 MG tablet Commonly known as: FOLVITE  TAKE 2 TABLETS BY MOUTH EVERY DAY   hydrochlorothiazide  12.5 MG capsule Commonly known as: MICROZIDE  Take 12.5 mg by mouth daily.   meclizine  25 MG tablet Commonly known as: ANTIVERT  Take 25 mg by mouth as needed for dizziness or nausea.   montelukast  10 MG tablet Commonly known as: SINGULAIR  Take 10 mg by mouth as needed (allergies).   Synthroid  88 MCG tablet Generic drug: levothyroxine  Take 88 mcg by mouth daily before breakfast.   valsartan  80 MG tablet Commonly known as: Diovan  Take 1 tablet (80 mg total) by mouth daily.   Vitamin D3 Maximum Strength 125 MCG (5000 UT) capsule Generic drug: Cholecalciferol  Take 5,000 Units by mouth daily.   zolpidem  10 MG tablet Commonly known as: AMBIEN  Take 0.5 tablets (5 mg total) by mouth at bedtime. What changed: how much to take        Discharge Exam: There were no vitals filed for this visit. Physical Exam   Constitutional: In no distress.  Cardiovascular: Normal rate, regular rhythm. No lower extremity edema  Pulmonary: Non labored breathing on room air, no wheezing or rales.  Abdominal: Soft. Normal bowel sounds. Non distended and non tender Musculoskeletal: Normal range of motion. L knee ROM limited d/t pain, swelling decreased, not WTT     Neurological: Alert and oriented to person, place, and time. Non focal  Skin: Skin is warm and dry.    Condition at discharge: stable  The results of significant diagnostics from this hospitalization (including imaging, microbiology, ancillary and laboratory) are listed below for reference.   Imaging Studies: CT KNEE LEFT WO CONTRAST Result Date: 05/24/2024 CLINICAL DATA:  Knee trauma, total knee prosthesis.  Knee effusion. EXAM: CT OF THE LEFT KNEE WITHOUT CONTRAST TECHNIQUE: Multidetector CT imaging of the left knee was performed according to the standard protocol. Multiplanar CT image reconstructions were also generated. RADIATION DOSE REDUCTION: This exam was performed according to the departmental dose-optimization program which includes automated exposure control, adjustment of the mA and/or kV according to patient size and/or use of iterative reconstruction technique. COMPARISON:  Radiographs 05/22/2024 FINDINGS: Bones/Joint/Cartilage Total knee prosthesis. No appreciable periprosthetic fracture or acute bony findings. There is some chronic lucency along the medullary space of the fibula least partially due to a geode from the proximal tibiofibular articulation. Moderate knee joint effusion. Ligaments Suboptimally assessed by CT. Muscles and Tendons Unremarkable where not obscured by streak artifact. Soft tissues Unremarkable IMPRESSION: 1. Moderate knee joint effusion. 2. Total knee prosthesis without appreciable periprosthetic fracture or acute bony findings. 3. Chronic lucency along the medullary space of the fibula least partially due to a geode from  the proximal tibiofibular articulation. Electronically Signed   By: Freida Jes M.D.   On: 05/24/2024 16:47   CT Angio Chest Pulmonary Embolism (PE) W or WO Contrast Result Date: 05/24/2024 CLINICAL DATA:  Positive D-dimer EXAM: CT ANGIOGRAPHY CHEST WITH CONTRAST TECHNIQUE: Multidetector CT imaging of the chest was performed using the standard protocol during bolus administration of intravenous contrast. Multiplanar CT image reconstructions and MIPs were obtained to evaluate the vascular anatomy. RADIATION DOSE REDUCTION: This exam was performed according to the departmental dose-optimization program which includes automated exposure control, adjustment of the mA and/or kV according to patient size and/or use of iterative reconstruction technique. CONTRAST:  58mL OMNIPAQUE  IOHEXOL  350 MG/ML SOLN COMPARISON:  Chest x-ray 05/22/2024, CT chest 05/29/2023 FINDINGS: Cardiovascular: Satisfactory opacification of the pulmonary arteries to the segmental level. No evidence of pulmonary embolism. Moderate aortic atherosclerosis. Aneurysmal dilatation of the ascending aorta up to 4.2 cm, previously 4.2 cm. Coronary vascular calcifications. Normal cardiac size. No pericardial effusion Mediastinum/Nodes: Patent trachea. No thyroid  mass. No suspicious lymph nodes. Lungs/Pleura: No acute airspace disease, pleural effusion, or pneumothorax. Blebs in the right middle lobe and right base Upper Abdomen: No acute finding Musculoskeletal: No acute osseous abnormality Review of the MIP images confirms the above findings. IMPRESSION: 1. Negative for acute pulmonary embolus or aortic dissection. 2. Aneurysmal dilatation of the ascending aorta up to 4.2 cm, previously 4.2 cm. Recommend annual imaging followup by CTA or MRA. This recommendation follows 2010 ACCF/AHA/AATS/ACR/ASA/SCA/SCAI/SIR/STS/SVM Guidelines for the Diagnosis and Management of Patients with Thoracic Aortic Disease. Circulation. 2010; 121: U981-X914. Aortic  aneurysm NOS (ICD10-I71.9) 3. Aortic atherosclerosis. Aortic aneurysm NOS (ICD10-I71.9). Aortic Atherosclerosis (ICD10-I70.0). Electronically Signed   By: Esmeralda Hedge M.D.   On: 05/24/2024 16:05   US  Abdomen Limited RUQ (LIVER/GB) Result Date: 05/24/2024 CLINICAL DATA:  Elevated transaminase.  Prior cholecystectomy EXAM: ULTRASOUND ABDOMEN LIMITED RIGHT UPPER QUADRANT COMPARISON:  CT 10/07/2019.  Ultrasound 05/14/2013 FINDINGS: Gallbladder: Previous cholecystectomy. Common bile duct: Diameter: 6 mm Liver: No  focal lesion identified. Within normal limits in parenchymal echogenicity. Portal vein is patent on color Doppler imaging with normal direction of blood flow towards the liver. Other: None. IMPRESSION: Previous cholecystectomy.  No ductal dilatation Electronically Signed   By: Adrianna Horde M.D.   On: 05/24/2024 11:30   ECHOCARDIOGRAM COMPLETE Result Date: 05/23/2024    ECHOCARDIOGRAM REPORT   Patient Name:   QUINCI GAVIDIA Date of Exam: 05/23/2024 Medical Rec #:  [id number]         Height:       65.0 in Accession #:    [id number]        Weight:       135.8 lb Date of Birth:  [birth year]         BSA:          1.678 m Patient Age:    77 years          BP:           120/61 mmHg Patient Gender: F                 HR:           78 bpm. Exam Location:  Inpatient Procedure: 2D Echo, Cardiac Doppler and Color Doppler (Both Spectral and Color            Flow Doppler were utilized during procedure). Indications:    Syncope  History:        Patient has prior history of Echocardiogram examinations, most                 recent 08/28/2023. Aortic Valve Disease; Risk                 Factors:Hypertension.  Sonographer:    Astrid Blamer Referring Phys: Davida Espy IMPRESSIONS  1. Left ventricular ejection fraction, by estimation, is 60 to 65%. The left ventricle has normal function. The left ventricle has no regional wall motion abnormalities. Left ventricular diastolic parameters were normal.  2. Right ventricular  systolic function is normal. The right ventricular size is normal.  3. The mitral valve is normal in structure. Trivial mitral valve regurgitation. No evidence of mitral stenosis.  4. The aortic valve is tricuspid. There is mild calcification of the aortic valve. There is mild thickening of the aortic valve. Aortic valve regurgitation is mild. Aortic valve sclerosis is present, with no evidence of aortic valve stenosis.  5. Aortic dilatation noted. There is borderline dilatation of the ascending aorta, measuring 39 mm.  6. The inferior vena cava is normal in size with greater than 50% respiratory variability, suggesting right atrial pressure of 3 mmHg. Comparison(s): Prior images reviewed side by side. Aortic regurgitation appears mild on current images (prior mild-moderate). Ascending aorta measures smaller than prior. Conclusion(s)/Recommendation(s): Otherwise normal echocardiogram, with minor abnormalities described in the report. FINDINGS  Left Ventricle: Left ventricular ejection fraction, by estimation, is 60 to 65%. The left ventricle has normal function. The left ventricle has no regional wall motion abnormalities. The left ventricular internal cavity size was normal in size. There is  no left ventricular hypertrophy. Left ventricular diastolic parameters were normal. Right Ventricle: The right ventricular size is normal. No increase in right ventricular wall thickness. Right ventricular systolic function is normal. Left Atrium: Left atrial size was normal in size. Right Atrium: Right atrial size was normal in size. Pericardium: There is no evidence of pericardial effusion. Presence of epicardial fat layer. Mitral Valve: The mitral valve is normal  in structure. Trivial mitral valve regurgitation. No evidence of mitral valve stenosis. Tricuspid Valve: The tricuspid valve is normal in structure. Tricuspid valve regurgitation is trivial. No evidence of tricuspid stenosis. Aortic Valve: The aortic valve is  tricuspid. There is mild calcification of the aortic valve. There is mild thickening of the aortic valve. Aortic valve regurgitation is mild. Aortic valve sclerosis is present, with no evidence of aortic valve stenosis. Aortic valve mean gradient measures 3.0 mmHg. Aortic valve peak gradient measures 5.5 mmHg. Aortic valve area, by VTI measures 2.36 cm. Pulmonic Valve: The pulmonic valve was not well visualized. Pulmonic valve regurgitation is trivial. No evidence of pulmonic stenosis. Aorta: Aortic dilatation noted. There is borderline dilatation of the ascending aorta, measuring 39 mm. Venous: The inferior vena cava is normal in size with greater than 50% respiratory variability, suggesting right atrial pressure of 3 mmHg. IAS/Shunts: The atrial septum is grossly normal.  LEFT VENTRICLE PLAX 2D LVIDd:         4.60 cm   Diastology LVIDs:         3.10 cm   LV e' medial:    6.42 cm/s LV PW:         1.00 cm   LV E/e' medial:  11.5 LV IVS:        0.80 cm   LV e' lateral:   12.90 cm/s LVOT diam:     2.00 cm   LV E/e' lateral: 5.7 LV SV:         56 LV SV Index:   33 LVOT Area:     3.14 cm  RIGHT VENTRICLE RV S prime:     16.40 cm/s TAPSE (M-mode): 2.2 cm LEFT ATRIUM             Index        RIGHT ATRIUM           Index LA Vol (A2C):   28.7 ml 17.10 ml/m  RA Area:     11.50 cm LA Vol (A4C):   48.8 ml 29.08 ml/m  RA Volume:   25.20 ml  15.02 ml/m LA Biplane Vol: 37.5 ml 22.35 ml/m  AORTIC VALVE AV Area (Vmax):    2.44 cm AV Area (Vmean):   2.30 cm AV Area (VTI):     2.36 cm AV Vmax:           117.00 cm/s AV Vmean:          82.300 cm/s AV VTI:            0.236 m AV Peak Grad:      5.5 mmHg AV Mean Grad:      3.0 mmHg LVOT Vmax:         91.00 cm/s LVOT Vmean:        60.200 cm/s LVOT VTI:          0.177 m LVOT/AV VTI ratio: 0.75  AORTA Ao Root diam: 3.00 cm MITRAL VALVE MV Area (PHT): 2.36 cm    SHUNTS MV Decel Time: 322 msec    Systemic VTI:  0.18 m MV E velocity: 73.90 cm/s  Systemic Diam: 2.00 cm MV A velocity:  89.10 cm/s MV E/A ratio:  0.83 Sheryle Donning MD Electronically signed by Sheryle Donning MD Signature Date/Time: 05/23/2024/12:50:48 PM    Final    DG Knee Left Port Result Date: 05/22/2024 CLINICAL DATA:  Knee pain EXAM: PORTABLE LEFT KNEE - 1-2 VIEW COMPARISON:  None Available. FINDINGS: Knee replacement. No fracture or  malalignment. Positive for knee effusion IMPRESSION: Knee replacement. Positive for knee effusion. Electronically Signed   By: Esmeralda Hedge M.D.   On: 05/22/2024 20:48   MR BRAIN WO CONTRAST Result Date: 05/22/2024 CLINICAL DATA:  Provided history: Headache, neuro deficit. EXAM: MRI HEAD WITHOUT CONTRAST TECHNIQUE: Multiplanar, multiecho pulse sequences of the brain and surrounding structures were obtained without intravenous contrast. COMPARISON:  Head CT 05/22/2024. FINDINGS: Brain: Mild generalized cerebral atrophy. Multifocal T2 FLAIR hyperintense signal abnormality within the cerebral white matter, nonspecific but compatible with mild for age chronic small vessel ischemic disease. No cortical encephalomalacia is identified. There is no acute infarct. No evidence of an intracranial mass. No chronic intracranial blood products. No extra-axial fluid collection. No midline shift. Vascular: Maintained flow voids within the proximal large arterial vessels. Skull and upper cervical spine: No focal worrisome marrow lesion. Sinuses/Orbits: No mass or acute finding within the imaged orbits. Prior bilateral ocular lens replacement. No significant paranasal sinus disease. Other: Small-volume fluid within left mastoid air cells. IMPRESSION: 1. No evidence of an acute intracranial abnormality. 2. Chronic small vessel ischemic changes within the cerebral white matter, mild for age. 3. Mild generalized cerebral atrophy. 4. Small left mastoid effusion. Electronically Signed   By: Bascom Lily D.O.   On: 05/22/2024 13:22   CT HEAD WO CONTRAST Result Date: 05/22/2024 CLINICAL DATA:   Provided history: Headache, neuro deficit. Neck trauma. Additional history obtained from electronic MEDICAL RECORD NUMBERFall from bed, neck pain. EXAM: CT HEAD WITHOUT CONTRAST CT CERVICAL SPINE WITHOUT CONTRAST TECHNIQUE: Multidetector CT imaging of the head and cervical spine was performed following the standard protocol without intravenous contrast. Multiplanar CT image reconstructions of the cervical spine were also generated. RADIATION DOSE REDUCTION: This exam was performed according to the departmental dose-optimization program which includes automated exposure control, adjustment of the mA and/or kV according to patient size and/or use of iterative reconstruction technique. COMPARISON:  None. FINDINGS: CT HEAD FINDINGS Brain: Mild generalized cerebral atrophy. Patchy and ill-defined hypoattenuation within the cerebral white matter, nonspecific but compatible with mild chronic small vessel ischemic disease. There is no acute intracranial hemorrhage. No demarcated cortical infarct. No extra-axial fluid collection. No evidence of an intracranial mass. No midline shift. Vascular: No hyperdense vessel.  Atherosclerotic calcifications. Skull: No calvarial fracture or aggressive osseous lesion. Sinuses/Orbits: No mass or acute finding within the imaged orbits. No significant paranasal sinus disease. CT CERVICAL SPINE FINDINGS Alignment: Dextrocurvature of the cervical spine. Nonspecific reversal of the expected cervical lordosis. 2 mm C3-C4 grade 1 anterolisthesis. Slight C5-C6 grade 1 retrolisthesis. 2 mm C7-T1 grade 1 anterolisthesis. Slight T1-T2 grade 1 anterolisthesis. Skull base and vertebrae: The basion-dental and atlanto-dental intervals are maintained.No evidence of acute fracture to the cervical spine. Soft tissues and spinal canal: No prevertebral fluid or swelling. No visible canal hematoma. Disc levels: Cervical spondylosis with multilevel disc space narrowing, disc bulges/central disc protrusions,  endplate spurring, uncovertebral hypertrophy and facet arthropathy. Disc space narrowing is greatest at C5-C6 (advanced) and C6-C7 (moderate-to-advanced). No appreciable high-grade spinal canal stenosis. Multilevel bony neural foraminal narrowing. Degenerative changes also present at the C1-C2 articulation. Small multilevel ventral osteophytes. Upper chest: No consolidation within the imaged lung apices. No visible pneumothorax. Other: Chronic, healed fracture deformities of the posterior left third and fourth ribs. IMPRESSION: Non-contrast head CT: 1.  No evidence of an acute intracranial abnormality. 2. Parenchymal atrophy and chronic small vessel ischemic disease. CT cervical spine: 1. No evidence of an acute cervical spine fracture. 2. Nonspecific reversal  of the expected cervical lordosis. 3. Dextrocurvature of the cervical spine. 4. Mild grade 1 spondylolisthesis at C3-C4, C4-C5, C5-C6, C7-T1 and T1-T2. 5. Cervical spondylosis as described within the body of the report. 6. Chronic, healed fracture deformities of the posterior left third and fourth ribs Electronically Signed   By: Bascom Lily D.O.   On: 05/22/2024 12:26   CT CERVICAL SPINE WO CONTRAST Result Date: 05/22/2024 CLINICAL DATA:  Provided history: Headache, neuro deficit. Neck trauma. Additional history obtained from electronic MEDICAL RECORD NUMBERFall from bed, neck pain. EXAM: CT HEAD WITHOUT CONTRAST CT CERVICAL SPINE WITHOUT CONTRAST TECHNIQUE: Multidetector CT imaging of the head and cervical spine was performed following the standard protocol without intravenous contrast. Multiplanar CT image reconstructions of the cervical spine were also generated. RADIATION DOSE REDUCTION: This exam was performed according to the departmental dose-optimization program which includes automated exposure control, adjustment of the mA and/or kV according to patient size and/or use of iterative reconstruction technique. COMPARISON:  None. FINDINGS: CT HEAD  FINDINGS Brain: Mild generalized cerebral atrophy. Patchy and ill-defined hypoattenuation within the cerebral white matter, nonspecific but compatible with mild chronic small vessel ischemic disease. There is no acute intracranial hemorrhage. No demarcated cortical infarct. No extra-axial fluid collection. No evidence of an intracranial mass. No midline shift. Vascular: No hyperdense vessel.  Atherosclerotic calcifications. Skull: No calvarial fracture or aggressive osseous lesion. Sinuses/Orbits: No mass or acute finding within the imaged orbits. No significant paranasal sinus disease. CT CERVICAL SPINE FINDINGS Alignment: Dextrocurvature of the cervical spine. Nonspecific reversal of the expected cervical lordosis. 2 mm C3-C4 grade 1 anterolisthesis. Slight C5-C6 grade 1 retrolisthesis. 2 mm C7-T1 grade 1 anterolisthesis. Slight T1-T2 grade 1 anterolisthesis. Skull base and vertebrae: The basion-dental and atlanto-dental intervals are maintained.No evidence of acute fracture to the cervical spine. Soft tissues and spinal canal: No prevertebral fluid or swelling. No visible canal hematoma. Disc levels: Cervical spondylosis with multilevel disc space narrowing, disc bulges/central disc protrusions, endplate spurring, uncovertebral hypertrophy and facet arthropathy. Disc space narrowing is greatest at C5-C6 (advanced) and C6-C7 (moderate-to-advanced). No appreciable high-grade spinal canal stenosis. Multilevel bony neural foraminal narrowing. Degenerative changes also present at the C1-C2 articulation. Small multilevel ventral osteophytes. Upper chest: No consolidation within the imaged lung apices. No visible pneumothorax. Other: Chronic, healed fracture deformities of the posterior left third and fourth ribs. IMPRESSION: Non-contrast head CT: 1.  No evidence of an acute intracranial abnormality. 2. Parenchymal atrophy and chronic small vessel ischemic disease. CT cervical spine: 1. No evidence of an acute cervical  spine fracture. 2. Nonspecific reversal of the expected cervical lordosis. 3. Dextrocurvature of the cervical spine. 4. Mild grade 1 spondylolisthesis at C3-C4, C4-C5, C5-C6, C7-T1 and T1-T2. 5. Cervical spondylosis as described within the body of the report. 6. Chronic, healed fracture deformities of the posterior left third and fourth ribs Electronically Signed   By: Bascom Lily D.O.   On: 05/22/2024 12:26   DG Chest 1 View Result Date: 05/22/2024 CLINICAL DATA:  Fall today. EXAM: CHEST  1 VIEW COMPARISON:  October 07, 2019. FINDINGS: The heart size and mediastinal contours are within normal limits. Both lungs are clear. The visualized skeletal structures are unremarkable. IMPRESSION: No active disease. Electronically Signed   By: Rosalene Colon M.D.   On: 05/22/2024 11:36   DG Wrist Complete Right Result Date: 05/22/2024 CLINICAL DATA:  Right wrist pain after fall today. EXAM: RIGHT WRIST - COMPLETE 3+ VIEW COMPARISON:  None Available. FINDINGS: There is no evidence  of fracture or dislocation. Severe degenerative changes are seen between the trapezium and scaphoid. Mild degenerative changes are seen involving the radiocarpal and radioulnar joints. Soft tissues are unremarkable. IMPRESSION: Degenerative changes as noted above.  No acute abnormality seen. Electronically Signed   By: Rosalene Colon M.D.   On: 05/22/2024 11:35   DG Shoulder Right Result Date: 05/22/2024 CLINICAL DATA:  Right shoulder pain after fall today. EXAM: RIGHT SHOULDER - 2+ VIEW COMPARISON:  None Available. FINDINGS: There is no evidence of fracture or dislocation. Mild degenerative changes seen involving right acromioclavicular joint. Soft tissues are unremarkable. IMPRESSION: No acute abnormality seen Electronically Signed   By: Rosalene Colon M.D.   On: 05/22/2024 11:32    Microbiology: Results for orders placed or performed during the hospital encounter of 05/22/24  Resp panel by RT-PCR (RSV, Flu A&B, Covid) Anterior  Nasal Swab     Status: None   Collection Time: 05/22/24 10:29 AM   Specimen: Anterior Nasal Swab  Result Value Ref Range Status   SARS Coronavirus 2 by RT PCR NEGATIVE NEGATIVE Final   Influenza A by PCR NEGATIVE NEGATIVE Final   Influenza B by PCR NEGATIVE NEGATIVE Final    Comment: (NOTE) The Xpert Xpress SARS-CoV-2/FLU/RSV plus assay is intended as an aid in the diagnosis of influenza from Nasopharyngeal swab specimens and should not be used as a sole basis for treatment. Nasal washings and aspirates are unacceptable for Xpert Xpress SARS-CoV-2/FLU/RSV testing.  Fact Sheet for Patients: BloggerCourse.com  Fact Sheet for Healthcare Providers: SeriousBroker.it  This test is not yet approved or cleared by the [place]  FDA and has been authorized for detection and/or diagnosis of SARS-CoV-2 by FDA under an Emergency Use Authorization (EUA). This EUA will remain in effect (meaning this test can be used) for the duration of the COVID-19 declaration under Section 564(b)(1) of the Act, 21 U.S.C. section 360bbb-3(b)(1), unless the authorization is terminated or revoked.     Resp Syncytial Virus by PCR NEGATIVE NEGATIVE Final    Comment: (NOTE) Fact Sheet for Patients: BloggerCourse.com  Fact Sheet for Healthcare Providers: SeriousBroker.it  This test is not yet approved or cleared by the [place]  FDA and has been authorized for detection and/or diagnosis of SARS-CoV-2 by FDA under an Emergency Use Authorization (EUA). This EUA will remain in effect (meaning this test can be used) for the duration of the COVID-19 declaration under Section 564(b)(1) of the Act, 21 U.S.C. section 360bbb-3(b)(1), unless the authorization is terminated or revoked.  Performed at [facility], [address], [place], [place] [address]   Urine Culture     Status: Abnormal   Collection  Time: 05/22/24  1:51 PM   Specimen: Urine, Clean Catch  Result Value Ref Range Status   Specimen Description URINE, CLEAN CATCH  Final   Special Requests   Final    NONE Reflexed from [id number] Performed at [facility], [address], [place], [place] [address]    Culture (A)  Final    >=100,000 COLONIES/mL ESCHERICHIA COLI Confirmed Extended Spectrum Beta-Lactamase Producer (ESBL).  In bloodstream infections from ESBL organisms, carbapenems are preferred over piperacillin/tazobactam. They are shown to have a lower risk of mortality.    Report Status 05/24/2024 FINAL  Final   Organism ID, Bacteria ESCHERICHIA COLI (A)  Final      Susceptibility   Escherichia coli - MIC*    AMPICILLIN >=32 RESISTANT Resistant     CEFAZOLIN  >=64 RESISTANT Resistant  CEFEPIME 16 RESISTANT Resistant     CEFTRIAXONE  >=64 RESISTANT Resistant     CIPROFLOXACIN >=4 RESISTANT Resistant     GENTAMICIN >=16 RESISTANT Resistant     IMIPENEM <=0.25 SENSITIVE Sensitive     NITROFURANTOIN 64 INTERMEDIATE Intermediate     TRIMETH/SULFA <=20 SENSITIVE Sensitive     AMPICILLIN/SULBACTAM >=32 RESISTANT Resistant     PIP/TAZO <=4 SENSITIVE Sensitive ug/mL    * >=100,000 COLONIES/mL ESCHERICHIA COLI    Labs: CBC: Recent Labs  Lab 05/22/24 1138 05/22/24 2339 05/23/24 0708 05/24/24 0547  WBC 9.3 5.1 4.8 3.9*  NEUTROABS 8.1*  --   --  2.7  HGB 12.2 11.5* 11.1* 10.9*  HCT 34.8* 33.1* 33.1* 32.5*  MCV 91.6 89.7 94.0 94.5  PLT 173 163 149* 141*   Basic Metabolic Panel: Recent Labs  Lab 05/22/24 1235 05/22/24 2339 05/23/24 0708 05/24/24 0547  NA 131*  --  132* 134*  K 4.1  --  3.9 4.2  CL 98  --  102 104  CO2 21*  --  23 22  GLUCOSE 126*  --  120* 106*  BUN 14  --  11 9  CREATININE 0.64 0.73 0.65 0.67  CALCIUM  9.0  --  8.4* 8.7*   Liver Function Tests: Recent Labs  Lab 05/22/24 1235 05/23/24 0708 05/24/24 0547 05/24/24 0548  AST 224* 203* 91*  --   ALT 201* 333* 229*  --   ALKPHOS 55  59 67  --   BILITOT 2.0* 1.2 0.7 0.8  PROT 6.4* 5.6* 5.5*  --   ALBUMIN 3.9 3.2* 3.1*  --    CBG: No results for input(s): GLUCAP in the last 168 hours.  Discharge time spent: greater than 30 minutes.  Signed: Joette Mustard, MD [facility] 05/27/2024

## 2024-07-15 ENCOUNTER — Ambulatory Visit: Attending: Cardiovascular Disease | Admitting: Cardiovascular Disease

## 2024-07-15 ENCOUNTER — Encounter: Payer: Self-pay | Admitting: Cardiovascular Disease

## 2024-07-15 VITALS — BP 137/79 | HR 68 | Resp 16 | Ht 65.0 in | Wt 137.0 lb

## 2024-07-15 DIAGNOSIS — I2510 Atherosclerotic heart disease of native coronary artery without angina pectoris: Secondary | ICD-10-CM

## 2024-07-15 DIAGNOSIS — I7121 Aneurysm of the ascending aorta, without rupture: Secondary | ICD-10-CM

## 2024-07-15 DIAGNOSIS — I10 Essential (primary) hypertension: Secondary | ICD-10-CM

## 2024-07-15 DIAGNOSIS — I351 Nonrheumatic aortic (valve) insufficiency: Secondary | ICD-10-CM | POA: Diagnosis not present

## 2024-07-15 NOTE — Patient Instructions (Signed)
 Medication Instructions:  No changes *If you need a refill on your cardiac medications before your next appointment, please call your pharmacy*  Lab Work: none If you have labs (blood work) drawn today and your tests are completely normal, you will receive your results only by: MyChart Message (if you have MyChart) OR A paper copy in the mail If you have any lab test that is abnormal or we need to change your treatment, we will call you to review the results.  Testing/Procedures: none  Follow-Up: At [facility], you and your health needs are our priority.  As part of our continuing mission to provide you with exceptional heart care, our providers are all part of one team.  This team includes your primary Cardiologist (physician) and Advanced Practice Providers or APPs (Physician Assistants and Nurse Practitioners) who all work together to provide you with the care you need, when you need it.  Your next appointment:   12 month(s)  Provider:   Antoinette Batman, MD    We recommend signing up for the patient portal called "MyChart".  Sign up information is provided on this After Visit Summary.  MyChart is used to connect with patients for Virtual Visits (Telemedicine).  Patients are able to view lab/test results, encounter notes, upcoming appointments, etc.  Non-urgent messages can be sent to your provider as well.   To learn more about what you can do with MyChart, go to [email or web address].   Other Instructions

## 2024-07-15 NOTE — Progress Notes (Signed)
 Chief Complaint  Patient presents with   Coronary artery disease involving native coronary artery of   Follow-up    6 months   History of Present Illness: 78 yo female with history of cold agglutinin hemolytic anemia, arthritis, prior DVT, anxiety/depression, vertigo, HTN, thoracic aortic aneurysm, hypothyroidism and CAD with finding of coronary calcium  on non-contrast chest CT who is here today for cardiac follow up. I saw her as a new consult in August 2022 to discuss her abnormal coronary calcium  score. She is known to have mild dilation of her ascending aorta (4.2 cm by most recent chest CTA in June 2025, stable). Coronary calcium  score of 2770 in May 2022. Echo September 2022 with LVEF=60-65%. Grade 1 diastolic dysfunction. Mild AI. Nuclear stress test 08/31/21 with no evidence of ischemia. She was admitted to [facility] in June 2025 after a fall vs syncope. She tells me that she was in bed reading a book and had severe arm and leg pain. She stood up and got dizzy and had nausea. She passed out. Brain MRI with no acute events. Echo June 2025 with LVEF=60-65%. Normal RV function. Mild AI.   She is here today for follow up. No recurrence of dizziness over the past month. She denies any chest pain, dyspnea, palpitations, lower extremity edema, orthopnea, PND, dizziness, near syncope or syncope.   Primary Care Physician:Shaw, Elsie JONETTA Raddle., MD  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   Deep venous thrombosis of calf (HCC) 2002   Depression    Diverticulosis    Goals of care, counseling/discussion 10/11/2019   History of blood transfusion 2009   Hypertension    Osteopenia    PONV (postoperative nausea and vomiting)    nausea only yrs ago   Tubular adenoma of colon 2011    Past Surgical History:  Procedure Laterality Date   ABDOMINAL HYSTERECTOMY  age 61   cataract surgery Bilateral 2011   CHOLECYSTECTOMY N/A  09/20/2013   Procedure: LAPAROSCOPIC CHOLECYSTECTOMY WITH INTRAOPERATIVE CHOLANGIOGRAM;  Surgeon: Krystal JINNY Russell, MD;  Location: [facility];  Service: General;  Laterality: N/A;   KNEE ARTHROSCOPY     left knee   REPLACEMENT TOTAL KNEE Left 2006    Current Outpatient Medications  Medication Sig Dispense Refill   acetaminophen  (TYLENOL ) 500 MG tablet Take 500-1,000 mg by mouth every 6 (six) hours as needed for mild pain or headache.     aspirin  EC 81 MG tablet Take 81 mg by mouth daily. Swallow whole.     buPROPion  (WELLBUTRIN  XL) 300 MG 24 hr tablet Take 300 mg by mouth every morning.     cetirizine (ZYRTEC) 5 MG tablet Take 5 mg by mouth daily.     Cholecalciferol  (VITAMIN D3 MAXIMUM STRENGTH) 125 MCG (5000 UT) capsule Take 5,000 Units by mouth daily.     clobetasol  (TEMOVATE ) 0.05 % external solution Apply 1 Application topically daily as needed (For dryness).     EPIPEN 2-PAK 0.3 MG/0.3ML SOAJ injection Inject 0.3 mg into the muscle once as needed for anaphylaxis.  6   folic acid  (FOLVITE ) 1 MG tablet TAKE 2 TABLETS BY MOUTH EVERY DAY 360 tablet 0   hydrochlorothiazide  (MICROZIDE ) 12.5 MG capsule Take 12.5 mg by mouth daily.  11   meclizine  (ANTIVERT ) 25 MG tablet Take 25 mg by mouth as needed for dizziness or nausea.     montelukast  (SINGULAIR ) 10 MG  tablet Take 10 mg by mouth as needed (allergies).     rosuvastatin  (CRESTOR ) 20 MG tablet Take 20 mg by mouth daily.     SYNTHROID  88 MCG tablet Take 88 mcg by mouth daily before breakfast.      valsartan  (DIOVAN ) 80 MG tablet Take 1 tablet (80 mg total) by mouth daily. 90 tablet 3   zolpidem  (AMBIEN ) 10 MG tablet Take 0.5 tablets (5 mg total) by mouth at bedtime.     amoxicillin (AMOXIL) 500 MG tablet Take 500 mg by mouth. Before dental work (Patient not taking: Reported on 07/15/2024)     No current facility-administered medications for this visit.   Facility-Administered Medications Ordered in Other Visits  Medication Dose Route  Frequency Provider Last Rate Last Admin   regadenoson  (LEXISCAN ) injection SOLN 0.4 mg  0.4 mg Intravenous Once Raford Riggs, MD        Allergies  Allergen Reactions   Bee Venom Anaphylaxis and Shortness Of Breath   Losartan Potassium Other (See Comments)   Other Other (See Comments)   Varenicline Other (See Comments)    Social History   Socioeconomic History   Marital status: Married    Spouse name: Not on file   Number of children: 3   Years of education: Not on file   Highest education level: Not on file  Occupational History   Occupation: Self employeed   Tobacco Use   Smoking status: Former    Current packs/day: 0.00    Average packs/day: 0.5 packs/day for 45.6 years (22.8 ttl pk-yrs)    Types: Cigarettes    Start date: 01/28/1964    Quit date: 09/02/2009    Years since quitting: 14.8   Smokeless tobacco: Never   Tobacco comments:    quit 5 years ago  Vaping Use   Vaping status: Never Used  Substance and Sexual Activity   Alcohol use: Yes    Alcohol/week: 5.0 standard drinks of alcohol    Types: 5 Glasses of wine per week    Comment: 2 glasses wine per day   Drug use: No   Sexual activity: Not on file  Other Topics Concern   Not on file  Social History Narrative   Not on file   Social Drivers of Health   Financial Resource Strain: Not on file  Food Insecurity: No Food Insecurity (05/22/2024)   Hunger Vital Sign    Worried About Running Out of Food in the Last Year: Never true    Ran Out of Food in the Last Year: Never true  Transportation Needs: No Transportation Needs (05/22/2024)   PRAPARE - Administrator, Civil Service (Medical): No    Lack of Transportation (Non-Medical): No  Physical Activity: Not on file  Stress: Not on file  Social Connections: Moderately Isolated (05/22/2024)   Social Connection and Isolation Panel    Frequency of Communication with Friends and Family: Never    Frequency of Social Gatherings with Friends and  Family: Three times a week    Attends Religious Services: Never    Active Member of Clubs or Organizations: No    Attends Banker Meetings: Never    Marital Status: Married  Catering manager Violence: Not At Risk (05/22/2024)   Humiliation, Afraid, Rape, and Kick questionnaire    Fear of Current or Ex-Partner: No    Emotionally Abused: No    Physically Abused: No    Sexually Abused: No    Family History  Problem Relation  Age of Onset   Hypertension Mother    Alcohol abuse Father    Heart attack Father        died age 71   Heart disease Brother    Heart disease Brother        Died on table while having stent placement   Heart failure Brother    Heart attack Maternal Grandmother    Liver cancer Maternal Grandfather     Review of Systems:  As stated in the HPI and otherwise negative.   BP 137/79 (BP Location: Left Arm, Patient Position: Sitting, Cuff Size: Normal)   Pulse 68   Resp 16   Ht 5' 5 (1.651 m)   Wt 137 lb (62.1 kg)   SpO2 97%   BMI 22.80 kg/m   Physical Examination: General: Well developed, well nourished, NAD  HEENT: OP clear, mucus membranes moist  SKIN: warm, dry. No rashes. Neuro: No focal deficits  Musculoskeletal: Muscle strength 5/5 all ext  Psychiatric: Mood and affect normal  Neck: No JVD, no carotid bruits, no thyromegaly, no lymphadenopathy.  Lungs:Clear bilaterally, no wheezes, rhonci, crackles Cardiovascular: Regular rate and rhythm. No murmurs, gallops or rubs. Abdomen:Soft. Bowel sounds present. Non-tender.  Extremities: No lower extremity edema. Pulses are 2 + in the bilateral DP/PT.  EKG:  EKG is not ordered today. The ekg ordered today demonstrates   Recent Labs: 05/24/2024: ALT 229; BUN 9; Creatinine, Ser 0.67; Hemoglobin 10.9; Platelets 141; Potassium 4.2; Sodium 134; TSH 2.352   Lipid Panel    Component Value Date/Time   CHOL CANCELED 10/16/2023 1004   TRIG CANCELED 10/16/2023 1004   HDL CANCELED 10/16/2023 1004    CHOLHDL CANCELED 10/16/2023 1004   LDLCALC CANCELED 10/16/2023 1004     Wt Readings from Last 3 Encounters:  07/15/24 137 lb (62.1 kg)  04/08/24 135 lb 12.8 oz (61.6 kg)  11/06/23 134 lb 9.6 oz (61.1 kg)    Assessment and Plan:   1. CAD without angina: No chest pain. Abnormal coronary calcium  score in 2022. Nuclear stress test in 2022 without ischemia. Echo with normal LV systolic function in June 2025. Will continue ASA and statin.   LDL 59 in May 2025.   2. HTN: BP is controlled. Continue current therapy  3. Aortic insufficiency: Mild AI by echo in June 2025.    4. Thoracic aortic aneurysm: 4.2 cm by Chest CTA in June 2025. Repeat CT chest in June 2026.    5. Syncope: Likely vasovagal from severe neck,arm and leg pain. No evidence of arrhythmias. Lv function is normal by recent echo. She has had no recurrence of syncope.   Labs/ tests ordered today include:  No orders of the defined types were placed in this encounter.  Disposition:   F/U with me in one year.    Signed, Lonni Cash, MD 07/15/2024 4:49 PM    [facility] [address] [place], [place], [place]  [address] Phone: [phone]; Fax: [phone]

## 2024-08-06 ENCOUNTER — Other Ambulatory Visit: Payer: Self-pay | Admitting: Cardiology

## 2024-08-06 ENCOUNTER — Other Ambulatory Visit: Payer: Self-pay | Admitting: Hematology & Oncology

## 2024-08-06 DIAGNOSIS — D500 Iron deficiency anemia secondary to blood loss (chronic): Secondary | ICD-10-CM

## 2024-10-07 ENCOUNTER — Inpatient Hospital Stay: Attending: Hematology & Oncology

## 2024-10-07 ENCOUNTER — Inpatient Hospital Stay: Admitting: Hematology & Oncology

## 2024-10-07 ENCOUNTER — Encounter: Payer: Self-pay | Admitting: Hematology & Oncology

## 2024-10-07 VITALS — BP 134/61 | HR 72 | Temp 97.7°F | Resp 20

## 2024-10-07 DIAGNOSIS — D5912 Cold autoimmune hemolytic anemia: Secondary | ICD-10-CM | POA: Diagnosis present

## 2024-10-07 DIAGNOSIS — Z8616 Personal history of COVID-19: Secondary | ICD-10-CM | POA: Insufficient documentation

## 2024-10-07 DIAGNOSIS — Z79890 Hormone replacement therapy (postmenopausal): Secondary | ICD-10-CM | POA: Insufficient documentation

## 2024-10-07 DIAGNOSIS — Z79899 Other long term (current) drug therapy: Secondary | ICD-10-CM | POA: Insufficient documentation

## 2024-10-07 DIAGNOSIS — Z7982 Long term (current) use of aspirin: Secondary | ICD-10-CM | POA: Diagnosis not present

## 2024-10-07 LAB — CBC WITH DIFFERENTIAL (CANCER CENTER ONLY)
Abs Immature Granulocytes: 0.03 K/uL (ref 0.00–0.07)
Basophils Absolute: 0.1 K/uL (ref 0.0–0.1)
Basophils Relative: 1 %
Eosinophils Absolute: 0.1 K/uL (ref 0.0–0.5)
Eosinophils Relative: 2 %
HCT: 36.5 % (ref 36.0–46.0)
Hemoglobin: 12.7 g/dL (ref 12.0–15.0)
Immature Granulocytes: 0 %
Lymphocytes Relative: 12 %
Lymphs Abs: 0.8 K/uL (ref 0.7–4.0)
MCH: 32.1 pg (ref 26.0–34.0)
MCHC: 34.8 g/dL (ref 30.0–36.0)
MCV: 92.2 fL (ref 80.0–100.0)
Monocytes Absolute: 0.4 K/uL (ref 0.1–1.0)
Monocytes Relative: 6 %
Neutro Abs: 5.5 K/uL (ref 1.7–7.7)
Neutrophils Relative %: 79 %
Platelet Count: 208 K/uL (ref 150–400)
RBC: 3.96 MIL/uL (ref 3.87–5.11)
RDW: 13.1 % (ref 11.5–15.5)
WBC Count: 6.9 K/uL (ref 4.0–10.5)
nRBC: 0 % (ref 0.0–0.2)

## 2024-10-07 LAB — RETICULOCYTES
Immature Retic Fract: 12.3 % (ref 2.3–15.9)
RBC.: 3.98 MIL/uL (ref 3.87–5.11)
Retic Count, Absolute: 98.7 K/uL (ref 19.0–186.0)
Retic Ct Pct: 2.5 % (ref 0.4–3.1)

## 2024-10-07 LAB — CMP (CANCER CENTER ONLY)
ALT: 23 U/L (ref 0–44)
AST: 31 U/L (ref 15–41)
Albumin: 4.5 g/dL (ref 3.5–5.0)
Alkaline Phosphatase: 66 U/L (ref 38–126)
Anion gap: 11 (ref 5–15)
BUN: 23 mg/dL (ref 8–23)
CO2: 23 mmol/L (ref 22–32)
Calcium: 9.4 mg/dL (ref 8.9–10.3)
Chloride: 101 mmol/L (ref 98–111)
Creatinine: 0.63 mg/dL (ref 0.44–1.00)
GFR, Estimated: >60 mL/min (ref >60)
Glucose, Bld: 111 mg/dL — ABNORMAL HIGH (ref 70–99)
Potassium: 4.8 mmol/L (ref 3.5–5.1)
Sodium: 135 mmol/L (ref 135–145)
Total Bilirubin: 1.4 mg/dL — ABNORMAL HIGH (ref 0.0–1.2)
Total Protein: 7 g/dL (ref 6.5–8.1)

## 2024-10-07 LAB — LACTATE DEHYDROGENASE: LDH: 257 U/L — ABNORMAL HIGH (ref 98–192)

## 2024-10-07 LAB — SAVE SMEAR(SSMR), FOR PROVIDER SLIDE REVIEW

## 2024-10-07 NOTE — Progress Notes (Signed)
 Hematology and Oncology Follow Up Visit  Christine [name] [id number] 1946/09/15 78 y.o. 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 daily      Interim History:  Christine [name] is back for follow-up.  We saw her 6 months ago.  Overall, she is managing.  She unfortunately broke a bone in her right foot.  She will be in a walking boot for quite a while.  Hopefully, she will not need surgery.  She has had no problems with nausea or vomiting.  She has had no fatigue or weakness.  She is still working at one of the [employer] in [place].  Her last cold agglutinin titer was 1: 512.  She has had no fever.  She has had no cough or shortness of breath.  She has had no change in bowel or bladder habits..  She has had no leg swelling.  She has had no rashes.  She has had no swollen lymph nodes.  Overall, I was that her performance status is probably ECOG 1.    Medications:  Current Outpatient Medications:    acetaminophen  (TYLENOL ) 500 MG tablet, Take 500-1,000 mg by mouth every 6 (six) hours as needed for mild pain or headache., Disp: , Rfl:    buPROPion  (WELLBUTRIN  XL) 300 MG 24 hr tablet, Take 300 mg by mouth every morning., Disp: , Rfl:    cetirizine (ZYRTEC) 5 MG tablet, Take 5 mg by mouth daily., Disp: , Rfl:    clobetasol  (TEMOVATE ) 0.05 % external solution, Apply 1 Application topically daily as needed (For dryness)., Disp: , Rfl:    folic acid  (FOLVITE ) 1 MG tablet, TAKE 2 TABLETS BY MOUTH EVERY DAY, Disp: 360 tablet, Rfl: 0   hydrochlorothiazide  (MICROZIDE ) 12.5 MG capsule, Take 12.5 mg by mouth daily., Disp: , Rfl: 11   meclizine  (ANTIVERT ) 25 MG tablet, Take 25 mg by mouth as needed for dizziness or nausea., Disp: , Rfl:    montelukast  (SINGULAIR ) 10 MG tablet, Take 10 mg by mouth as needed (allergies)., Disp: , Rfl:    rosuvastatin   (CRESTOR ) 20 MG tablet, Take 20 mg by mouth daily., Disp: , Rfl:    SYNTHROID  88 MCG tablet, Take 88 mcg by mouth daily before breakfast. , Disp: , Rfl:    valsartan  (DIOVAN ) 80 MG tablet, TAKE 1 TABLET BY MOUTH ONCE DAILY, Disp: 90 tablet, Rfl: 3   zolpidem  (AMBIEN ) 10 MG tablet, Take 0.5 tablets (5 mg total) by mouth at bedtime., Disp: , Rfl:    zolpidem  (AMBIEN ) 5 MG tablet, 5 mg at bedtime as needed., Disp: , Rfl:    amoxicillin (AMOXIL) 500 MG tablet, Take 500 mg by mouth. Before dental work (Patient not taking: Reported on 10/07/2024), Disp: , Rfl:    aspirin  EC 81 MG tablet, Take 81 mg by mouth daily. Swallow whole. (Patient not taking: Reported on 10/07/2024), Disp: , Rfl:    Cholecalciferol  (VITAMIN D3 MAXIMUM STRENGTH) 125 MCG (5000 UT) capsule, Take 5,000 Units by mouth daily. (Patient not taking: Reported on 10/07/2024), Disp: , Rfl:    EPIPEN 2-PAK 0.3 MG/0.3ML SOAJ injection, Inject 0.3 mg into the muscle once as needed for anaphylaxis. (Patient not taking: Reported on 10/07/2024), Disp: , Rfl: 6 No current facility-administered medications for this visit.  Facility-Administered Medications Ordered in Other Visits:    regadenoson  (LEXISCAN ) injection SOLN 0.4 mg, 0.4 mg,  Intravenous, Once, Raford Riggs, MD  Allergies:  Allergies  Allergen Reactions   Bee Venom Anaphylaxis and Shortness Of Breath   Losartan Potassium Other (See Comments)   Other Other (See Comments)   Varenicline Other (See Comments)    Past Medical History, Surgical history, Social history, and Family History were reviewed and updated.  Review of Systems: Review of Systems  Constitutional:  Positive for fatigue.  HENT:  Negative.    Eyes: Negative.   Respiratory: Negative.    Cardiovascular: Negative.   Gastrointestinal: Negative.   Endocrine: Negative.   Genitourinary: Negative.    Musculoskeletal: Negative.   Skin: Negative.   Neurological: Negative.   Hematological: Negative.    Psychiatric/Behavioral: Negative.      Physical Exam:  Vital signs are temperature 97.7.  Pulse 72.  Blood pressure 134/61.  Weight and was not taken.    Wt Readings from Last 3 Encounters:  07/15/24 137 lb (62.1 kg)  04/08/24 135 lb 12.8 oz (61.6 kg)  11/06/23 134 lb 9.6 oz (61.1 kg)    Physical Exam Vitals reviewed.  HENT:     Head: Normocephalic and atraumatic.  Eyes:     Pupils: Pupils are equal, round, and reactive to light.  Cardiovascular:     Rate and Rhythm: Normal rate and regular rhythm.     Heart sounds: Normal heart sounds.  Pulmonary:     Effort: Pulmonary effort is normal.     Breath sounds: Normal breath sounds.  Abdominal:     General: Bowel sounds are normal.     Palpations: Abdomen is soft.  Musculoskeletal:        General: No tenderness or deformity. Normal range of motion.     Cervical back: Normal range of motion.     Comments: He has a walking boot on the right foot.  Lymphadenopathy:     Cervical: No cervical adenopathy.  Skin:    General: Skin is warm and dry.     Findings: No erythema or rash.  Neurological:     Mental Status: She is alert and oriented to person, place, and time.  Psychiatric:        Behavior: Behavior normal.        Thought Content: Thought content normal.        Judgment: Judgment normal.      Lab Results  Component Value Date   WBC 3.9 (L) 05/24/2024   HGB 10.9 (L) 05/24/2024   HCT 32.5 (L) 05/24/2024   MCV 94.5 05/24/2024   PLT 141 (L) 05/24/2024     Chemistry      Component Value Date/Time   NA 134 (L) 05/24/2024 0547   NA CANCELED 10/16/2023 1004   NA 133 02/13/2014 1108   K 4.2 05/24/2024 0547   K 4.9 (H) 02/13/2014 1108   CL 104 05/24/2024 0547   CL 97 (L) 02/13/2014 1108   CO2 22 05/24/2024 0547   CO2 32 02/13/2014 1108   BUN 9 05/24/2024 0547   BUN CANCELED 10/16/2023 1004   BUN 13 02/13/2014 1108   CREATININE 0.67 05/24/2024 0547   CREATININE 0.83 04/08/2024 1345   CREATININE 0.4 (L)  02/13/2014 1108      Component Value Date/Time   CALCIUM  8.7 (L) 05/24/2024 0547   CALCIUM  9.3 02/13/2014 1108   ALKPHOS 67 05/24/2024 0547   ALKPHOS 63 02/13/2014 1108   AST 91 (H) 05/24/2024 0547   AST 22 04/08/2024 1345   ALT 229 (H) 05/24/2024 0547  ALT 20 04/08/2024 1345   ALT 15 02/13/2014 1108   BILITOT 0.8 05/24/2024 0548   BILITOT 1.7 (H) 04/08/2024 1345       Impression and Plan: Ms. Chancellor is a 78 year old white female.  She is a history of cold agglutinin disease.  We treated her with Rituxan  back in April 2015.  She tolerated this quite well.  She then had a relapse.  We went ahead and treated her with Rituxan /bendamustine .  She had 4 cycles of treatment thus completed in February 2021.  So far, her blood counts holding nicely steady.  Of regimen reticulocyte count is only 2.5%.  This is fantastic.  I hope that her for right foot gets better.  Again she is in a walking boot.  Hopefully, she will not need surgery.  We will plan to get her back in about 4 or 5 months.  I know with the colder months in the Winter, her blood count might go down a little bit.   Maude JONELLE Crease, MD 10/27/20252:12 PM

## 2024-10-09 LAB — COLD AGGLUTININ TITER: Cold Agglutinin Titer: 1:256 {titer} — ABNORMAL HIGH

## 2024-10-29 ENCOUNTER — Telehealth (HOSPITAL_COMMUNITY): Payer: Self-pay | Admitting: Pharmacy Technician

## 2024-10-29 ENCOUNTER — Other Ambulatory Visit (HOSPITAL_COMMUNITY): Payer: Self-pay | Admitting: Internal Medicine

## 2024-10-29 DIAGNOSIS — M8580 Other specified disorders of bone density and structure, unspecified site: Secondary | ICD-10-CM | POA: Insufficient documentation

## 2024-10-29 DIAGNOSIS — M810 Age-related osteoporosis without current pathological fracture: Secondary | ICD-10-CM | POA: Insufficient documentation

## 2024-10-29 NOTE — Telephone Encounter (Signed)
 Auth Submission: DENIED Site of care: [facility] Payer: [employer] MEDICARE Medication & CPT/J Code(s) submitted: Evenity (Romosozumab) [id number] Diagnosis Code: M81.0, M85.80 Route of submission (phone, fax, portal): portal Phone # Fax # Auth type: Buy/Bill HB Units/visits requested: 210mg  every month x 12 doses Reference number: [id number]   Per fax, Evenity is non-preferred drug. Step therapy requires patient to try and fail preferred meds Jubbonti or Stoboclo. Sent message to Arland at [facility] and she states okay to change to Jubbonti. Requested new referral be sent.   Erlin Gardella, CPhT [facility] Phone: [phone] 10/29/2024

## 2024-12-03 ENCOUNTER — Encounter (HOSPITAL_COMMUNITY): Payer: Self-pay | Admitting: Internal Medicine

## 2024-12-03 NOTE — Addendum Note (Signed)
 Addended by: DAYNE SHERRY RAMAN on: 12/03/2024 11:02 AM   Modules accepted: Orders

## 2024-12-13 ENCOUNTER — Telehealth (HOSPITAL_COMMUNITY): Payer: Self-pay | Admitting: Pharmacy Technician

## 2024-12-13 NOTE — Telephone Encounter (Signed)
 SABRA

## 2024-12-13 NOTE — Telephone Encounter (Signed)
 New Insurance for 2026.   Auth Submission: APPROVED Site of care: [facility] Payer: [employer] MEDICARE Medication & CPT/J Code(s) submitted: Evenity (Romosozumab) [id number] Diagnosis Code: M81.0, M85.80 Route of submission (phone, fax, portal): CMM KEY: [id number] Phone # Fax # Auth type: Buy/Bill HB Units/visits requested: 210mg  every month x 12 doses  Reference number: [id number] Approval from: 12/13/24 to 12/11/25    Dagoberto Armour, CPhT [facility] Phone: [phone] 12/13/2024

## 2024-12-19 ENCOUNTER — Encounter (HOSPITAL_COMMUNITY): Payer: Self-pay | Admitting: Internal Medicine

## 2024-12-30 ENCOUNTER — Ambulatory Visit (HOSPITAL_COMMUNITY)
Admission: RE | Admit: 2024-12-30 | Discharge: 2024-12-30 | Disposition: A | Discharge status: Home / Self Care | Source: Ambulatory Visit | Attending: Internal Medicine | Admitting: Internal Medicine

## 2024-12-30 VITALS — BP 135/76 | HR 76 | Temp 97.5°F | Resp 16

## 2024-12-30 DIAGNOSIS — M810 Age-related osteoporosis without current pathological fracture: Secondary | ICD-10-CM | POA: Insufficient documentation

## 2024-12-30 DIAGNOSIS — M8580 Other specified disorders of bone density and structure, unspecified site: Secondary | ICD-10-CM

## 2024-12-30 MED ORDER — ROMOSOZUMAB-AQQG 105 MG/1.17ML ~~LOC~~ SOSY
210.0000 mg | PREFILLED_SYRINGE | Freq: Once | SUBCUTANEOUS | Status: AC
  Administered 2024-12-30: 210 mg via SUBCUTANEOUS

## 2024-12-30 MED ORDER — ROMOSOZUMAB-AQQG 105 MG/1.17ML ~~LOC~~ SOSY
PREFILLED_SYRINGE | SUBCUTANEOUS | Status: AC
  Filled 2024-12-30: qty 1.17

## 2025-01-29 ENCOUNTER — Encounter (HOSPITAL_COMMUNITY)

## 2025-02-03 ENCOUNTER — Encounter (HOSPITAL_COMMUNITY)

## 2025-02-24 ENCOUNTER — Inpatient Hospital Stay: Admitting: Hematology & Oncology

## 2025-02-24 ENCOUNTER — Inpatient Hospital Stay: Payer: Self-pay
